# Patient Record
Sex: Female | Born: 1950 | Race: Black or African American | Hispanic: No | Smoking: Never smoker
Health system: Southern US, Community
[De-identification: ages and names within clinical notes are randomized; demographics above are authoritative.]

## PROBLEM LIST (undated history)

## (undated) DIAGNOSIS — R609 Edema, unspecified: Secondary | ICD-10-CM

## (undated) DIAGNOSIS — C50919 Malignant neoplasm of unspecified site of unspecified female breast: Secondary | ICD-10-CM

## (undated) DIAGNOSIS — G5600 Carpal tunnel syndrome, unspecified upper limb: Secondary | ICD-10-CM

## (undated) DIAGNOSIS — R011 Cardiac murmur, unspecified: Secondary | ICD-10-CM

## (undated) DIAGNOSIS — IMO0001 Reserved for inherently not codable concepts without codable children: Secondary | ICD-10-CM

## (undated) DIAGNOSIS — Z87898 Personal history of other specified conditions: Secondary | ICD-10-CM

## (undated) DIAGNOSIS — D509 Iron deficiency anemia, unspecified: Secondary | ICD-10-CM

## (undated) DIAGNOSIS — E11610 Type 2 diabetes mellitus with diabetic neuropathic arthropathy: Secondary | ICD-10-CM

## (undated) DIAGNOSIS — E119 Type 2 diabetes mellitus without complications: Secondary | ICD-10-CM

## (undated) DIAGNOSIS — I38 Endocarditis, valve unspecified: Secondary | ICD-10-CM

## (undated) DIAGNOSIS — C189 Malignant neoplasm of colon, unspecified: Secondary | ICD-10-CM

## (undated) DIAGNOSIS — C55 Malignant neoplasm of uterus, part unspecified: Secondary | ICD-10-CM

## (undated) DIAGNOSIS — I10 Essential (primary) hypertension: Secondary | ICD-10-CM

## (undated) DIAGNOSIS — M869 Osteomyelitis, unspecified: Secondary | ICD-10-CM

## (undated) DIAGNOSIS — K219 Gastro-esophageal reflux disease without esophagitis: Secondary | ICD-10-CM

## (undated) DIAGNOSIS — M810 Age-related osteoporosis without current pathological fracture: Secondary | ICD-10-CM

## (undated) DIAGNOSIS — I499 Cardiac arrhythmia, unspecified: Secondary | ICD-10-CM

## (undated) DIAGNOSIS — Z8719 Personal history of other diseases of the digestive system: Secondary | ICD-10-CM

## (undated) HISTORY — PX: OTHER SURGICAL HISTORY: SHX169

## (undated) HISTORY — PX: DILATION AND CURETTAGE OF UTERUS: SHX78

## (undated) HISTORY — DX: Malignant neoplasm of uterus, part unspecified: C55

## (undated) HISTORY — PX: KNEE ARTHROSCOPY: SUR90

## (undated) HISTORY — PX: ESOPHAGOGASTRODUODENOSCOPY: SHX1529

## (undated) HISTORY — PX: FOOT AMPUTATION: SHX951

## (undated) HISTORY — PX: COLON SURGERY: SHX602

## (undated) HISTORY — PX: COLONOSCOPY: SHX174

## (undated) HISTORY — PX: EYE SURGERY: SHX253

## (undated) HISTORY — PX: INCISION AND DRAINAGE: SHX5863

## (undated) HISTORY — PX: SLEEVE GASTROPLASTY: SHX1101

## (undated) HISTORY — PX: REDUCTION MAMMAPLASTY: SUR839

## (undated) HISTORY — PX: TONSILLECTOMY: SUR1361

## (undated) HISTORY — PX: BREAST REDUCTION SURGERY: SHX8

## (undated) HISTORY — PX: BREAST SURGERY: SHX581

---

## 1990-05-23 HISTORY — PX: MASTECTOMY: SHX3

## 2000-05-23 HISTORY — PX: LAPAROSCOPIC RIGHT COLECTOMY: SHX5925

## 2004-06-30 ENCOUNTER — Ambulatory Visit: Payer: Self-pay | Admitting: Unknown Physician Specialty

## 2005-03-23 ENCOUNTER — Ambulatory Visit: Payer: Self-pay | Admitting: Unknown Physician Specialty

## 2005-04-07 ENCOUNTER — Ambulatory Visit: Payer: Self-pay | Admitting: Unknown Physician Specialty

## 2007-03-26 ENCOUNTER — Ambulatory Visit: Payer: Self-pay | Admitting: Family Medicine

## 2007-04-04 ENCOUNTER — Ambulatory Visit: Payer: Self-pay | Admitting: Family Medicine

## 2007-05-07 ENCOUNTER — Ambulatory Visit: Payer: Self-pay | Admitting: Unknown Physician Specialty

## 2007-10-22 ENCOUNTER — Ambulatory Visit: Payer: Self-pay | Admitting: Family Medicine

## 2008-06-09 ENCOUNTER — Ambulatory Visit: Payer: Self-pay | Admitting: Family Medicine

## 2009-07-15 ENCOUNTER — Ambulatory Visit: Payer: Self-pay | Admitting: Family Medicine

## 2009-12-29 ENCOUNTER — Ambulatory Visit: Payer: Self-pay | Admitting: Ophthalmology

## 2010-01-12 ENCOUNTER — Ambulatory Visit: Payer: Self-pay | Admitting: Ophthalmology

## 2011-02-15 ENCOUNTER — Ambulatory Visit: Payer: Self-pay | Admitting: Family Medicine

## 2011-03-10 ENCOUNTER — Ambulatory Visit: Payer: Self-pay | Admitting: Bariatrics

## 2011-04-11 ENCOUNTER — Ambulatory Visit: Payer: Self-pay | Admitting: Unknown Physician Specialty

## 2011-04-13 LAB — PATHOLOGY REPORT

## 2012-02-02 ENCOUNTER — Ambulatory Visit: Payer: Self-pay | Admitting: Unknown Physician Specialty

## 2012-02-03 LAB — PATHOLOGY REPORT

## 2012-05-17 ENCOUNTER — Ambulatory Visit: Payer: Self-pay | Admitting: Family Medicine

## 2013-04-30 ENCOUNTER — Ambulatory Visit: Payer: Self-pay | Admitting: Bariatrics

## 2013-04-30 LAB — CALCIUM: Calcium, Total: 9.6 mg/dL (ref 8.5–10.1)

## 2013-04-30 LAB — PHOSPHORUS: Phosphorus: 2.9 mg/dL (ref 2.5–4.9)

## 2013-04-30 LAB — LIPASE, BLOOD: Lipase: 75 U/L (ref 73–393)

## 2013-04-30 LAB — MAGNESIUM: Magnesium: 1.6 mg/dL — ABNORMAL LOW

## 2013-07-15 ENCOUNTER — Ambulatory Visit: Payer: Self-pay | Admitting: Bariatrics

## 2013-07-21 ENCOUNTER — Ambulatory Visit: Payer: Self-pay | Admitting: Bariatrics

## 2013-11-27 DIAGNOSIS — E119 Type 2 diabetes mellitus without complications: Secondary | ICD-10-CM | POA: Insufficient documentation

## 2013-11-27 DIAGNOSIS — E782 Mixed hyperlipidemia: Secondary | ICD-10-CM | POA: Insufficient documentation

## 2013-11-27 DIAGNOSIS — I10 Essential (primary) hypertension: Secondary | ICD-10-CM | POA: Insufficient documentation

## 2014-07-22 ENCOUNTER — Ambulatory Visit: Payer: Self-pay | Admitting: Vascular Surgery

## 2014-07-24 ENCOUNTER — Encounter
Admit: 2014-07-24 | Disposition: A | Discharge status: Home / Self Care | Payer: Self-pay | Attending: Surgery | Admitting: Surgery

## 2014-07-31 ENCOUNTER — Ambulatory Visit: Payer: Self-pay | Admitting: Surgery

## 2014-08-07 ENCOUNTER — Ambulatory Visit: Payer: Self-pay | Admitting: Surgery

## 2014-08-19 ENCOUNTER — Other Ambulatory Visit: Payer: Self-pay | Admitting: Bariatrics

## 2014-08-19 LAB — CBC WITH DIFFERENTIAL/PLATELET
BASOS PCT: 0.7 %
Basophil #: 0.1 10*3/uL (ref 0.0–0.1)
EOS ABS: 0.1 10*3/uL (ref 0.0–0.7)
EOS PCT: 1.7 %
HCT: 27.3 % — AB (ref 35.0–47.0)
HGB: 8.4 g/dL — AB (ref 12.0–16.0)
Lymphocyte #: 2 10*3/uL (ref 1.0–3.6)
Lymphocyte %: 24.9 %
MCH: 24.4 pg — ABNORMAL LOW (ref 26.0–34.0)
MCHC: 30.8 g/dL — AB (ref 32.0–36.0)
MCV: 79 fL — ABNORMAL LOW (ref 80–100)
Monocyte #: 1 x10 3/mm — ABNORMAL HIGH (ref 0.2–0.9)
Monocyte %: 12.2 %
NEUTROS ABS: 4.8 10*3/uL (ref 1.4–6.5)
NEUTROS PCT: 60.5 %
Platelet: 615 10*3/uL — ABNORMAL HIGH (ref 150–440)
RBC: 3.45 10*6/uL — AB (ref 3.80–5.20)
RDW: 17.4 % — ABNORMAL HIGH (ref 11.5–14.5)
WBC: 7.8 10*3/uL (ref 3.6–11.0)

## 2014-08-19 LAB — COMPREHENSIVE METABOLIC PANEL
ALT: 14 U/L
Albumin: 2.9 g/dL — ABNORMAL LOW
Alkaline Phosphatase: 80 U/L
Anion Gap: 8 (ref 7–16)
BUN: 21 mg/dL — ABNORMAL HIGH
Bilirubin,Total: 0.4 mg/dL
CO2: 27 mmol/L
Calcium, Total: 9.3 mg/dL
Chloride: 99 mmol/L — ABNORMAL LOW
Creatinine: 1.3 mg/dL — ABNORMAL HIGH
EGFR (African American): 50 — ABNORMAL LOW
EGFR (Non-African Amer.): 43 — ABNORMAL LOW
Glucose: 105 mg/dL — ABNORMAL HIGH
POTASSIUM: 4.5 mmol/L
SGOT(AST): 16 U/L
SODIUM: 134 mmol/L — AB
Total Protein: 8.2 g/dL — ABNORMAL HIGH

## 2014-08-19 LAB — TSH: THYROID STIMULATING HORM: 0.948 u[IU]/mL

## 2014-08-19 LAB — FOLATE: Folic Acid: 34 ng/mL

## 2014-08-19 LAB — IRON: IRON: 14 ug/dL — AB

## 2014-08-19 LAB — FERRITIN: Ferritin (ARMC): 58 ng/mL

## 2014-08-19 LAB — MAGNESIUM: Magnesium: 1.6 mg/dL — ABNORMAL LOW

## 2014-08-20 LAB — HEMOGLOBIN A1C: Hemoglobin A1C: 7.1 % — ABNORMAL HIGH

## 2014-08-22 ENCOUNTER — Encounter
Admit: 2014-08-22 | Disposition: A | Discharge status: Skilled Nursing Facility | Payer: Self-pay | Attending: Surgery | Admitting: Surgery

## 2014-08-25 ENCOUNTER — Emergency Department
Admit: 2014-08-25 | Disposition: A | Discharge status: Home / Self Care | Payer: Self-pay | Admitting: Emergency Medicine

## 2014-08-25 LAB — CBC WITH DIFFERENTIAL/PLATELET
BASOS ABS: 0 10*3/uL (ref 0.0–0.1)
Basophil %: 0.3 %
Eosinophil #: 0.2 10*3/uL (ref 0.0–0.7)
Eosinophil %: 1.3 %
HCT: 26.7 % — ABNORMAL LOW (ref 35.0–47.0)
HGB: 8.2 g/dL — ABNORMAL LOW (ref 12.0–16.0)
LYMPHS ABS: 1.7 10*3/uL (ref 1.0–3.6)
Lymphocyte %: 13.6 %
MCH: 24.1 pg — AB (ref 26.0–34.0)
MCHC: 30.7 g/dL — ABNORMAL LOW (ref 32.0–36.0)
MCV: 78 fL — AB (ref 80–100)
MONO ABS: 1.3 x10 3/mm — AB (ref 0.2–0.9)
Monocyte %: 10.5 %
Neutrophil #: 9.5 10*3/uL — ABNORMAL HIGH (ref 1.4–6.5)
Neutrophil %: 74.3 %
Platelet: 623 10*3/uL — ABNORMAL HIGH (ref 150–440)
RBC: 3.41 10*6/uL — ABNORMAL LOW (ref 3.80–5.20)
RDW: 17.6 % — AB (ref 11.5–14.5)
WBC: 12.8 10*3/uL — AB (ref 3.6–11.0)

## 2014-08-25 LAB — COMPREHENSIVE METABOLIC PANEL
ALBUMIN: 2.8 g/dL — AB
AST: 15 U/L
Alkaline Phosphatase: 70 U/L
Anion Gap: 8 (ref 7–16)
BUN: 23 mg/dL — ABNORMAL HIGH
CALCIUM: 9.1 mg/dL
CO2: 26 mmol/L
Chloride: 100 mmol/L — ABNORMAL LOW
Creatinine: 1.09 mg/dL — ABNORMAL HIGH
EGFR (African American): >60
GFR CALC NON AF AMER: 53 — AB
GLUCOSE: 144 mg/dL — AB
POTASSIUM: 4.5 mmol/L
SGPT (ALT): 12 U/L — ABNORMAL LOW
SODIUM: 134 mmol/L — AB
Total Protein: 8.5 g/dL — ABNORMAL HIGH

## 2014-08-30 ENCOUNTER — Inpatient Hospital Stay
Admit: 2014-08-30 | Disposition: A | Discharge status: Skilled Nursing Facility | Payer: Self-pay | Attending: Internal Medicine | Admitting: Internal Medicine

## 2014-08-30 LAB — COMPREHENSIVE METABOLIC PANEL
ALK PHOS: 104 U/L
ALT: 15 U/L
Albumin: 2.7 g/dL — ABNORMAL LOW
Anion Gap: 8 (ref 7–16)
BUN: 23 mg/dL — ABNORMAL HIGH
Bilirubin,Total: 0.3 mg/dL
CALCIUM: 8.9 mg/dL
CO2: 27 mmol/L
CREATININE: 1.03 mg/dL — AB
Chloride: 99 mmol/L — ABNORMAL LOW
GFR CALC NON AF AMER: 57 — AB
Glucose: 129 mg/dL — ABNORMAL HIGH
Potassium: 3.9 mmol/L
SGOT(AST): 18 U/L
Sodium: 134 mmol/L — ABNORMAL LOW
Total Protein: 8.6 g/dL — ABNORMAL HIGH

## 2014-08-30 LAB — CBC WITH DIFFERENTIAL/PLATELET
Basophil #: 0 x10 3/mm 3
Basophil %: 0.4 %
Eosinophil #: 0.2 x10 3/mm 3
Eosinophil %: 2.1 %
HCT: 26.9 % — ABNORMAL LOW
HGB: 8.2 g/dL — ABNORMAL LOW
Lymphocyte %: 23.7 %
Lymphs Abs: 2 x10 3/mm 3
MCH: 23.7 pg — ABNORMAL LOW
MCHC: 30.5 g/dL — ABNORMAL LOW
MCV: 78 fL — ABNORMAL LOW
Monocyte #: 0.9 "x10 3/mm "
Monocyte %: 9.9 %
Neutrophil #: 5.5 x10 3/mm 3
Neutrophil %: 63.9 %
Platelet: 660 x10 3/mm 3 — ABNORMAL HIGH
RBC: 3.45 X10 6/mm 3 — ABNORMAL LOW
RDW: 17.3 % — ABNORMAL HIGH
WBC: 8.7 x10 3/mm 3

## 2014-08-30 LAB — LIPID PANEL
CHOLESTEROL: 100 mg/dL
HDL: 30 mg/dL — AB
Ldl Cholesterol, Calc: 51 mg/dL
Triglycerides: 95 mg/dL
VLDL CHOLESTEROL, CALC: 19 mg/dL

## 2014-08-31 LAB — BASIC METABOLIC PANEL
Anion Gap: 6 — ABNORMAL LOW (ref 7–16)
BUN: 19 mg/dL
Calcium, Total: 8.3 mg/dL — ABNORMAL LOW
Chloride: 102 mmol/L
Co2: 26 mmol/L
Creatinine: 0.94 mg/dL
EGFR (African American): >60
EGFR (Non-African Amer.): >60
Glucose: 119 mg/dL — ABNORMAL HIGH
Potassium: 3.9 mmol/L
Sodium: 134 mmol/L — ABNORMAL LOW

## 2014-08-31 LAB — HEMOGLOBIN A1C: Hemoglobin A1C: 6.9 % — ABNORMAL HIGH

## 2014-08-31 LAB — CBC WITH DIFFERENTIAL/PLATELET
BASOS PCT: 0.4 %
Basophil #: 0 10*3/uL (ref 0.0–0.1)
EOS PCT: 1.9 %
Eosinophil #: 0.2 10*3/uL (ref 0.0–0.7)
HCT: 21.8 % — ABNORMAL LOW (ref 35.0–47.0)
HGB: 6.8 g/dL — AB (ref 12.0–16.0)
Lymphocyte #: 2.3 10*3/uL (ref 1.0–3.6)
Lymphocyte %: 26.2 %
MCH: 24 pg — AB (ref 26.0–34.0)
MCHC: 31.2 g/dL — ABNORMAL LOW (ref 32.0–36.0)
MCV: 77 fL — ABNORMAL LOW (ref 80–100)
MONOS PCT: 13.3 %
Monocyte #: 1.1 x10 3/mm — ABNORMAL HIGH (ref 0.2–0.9)
Neutrophil #: 5 10*3/uL (ref 1.4–6.5)
Neutrophil %: 58.2 %
PLATELETS: 570 10*3/uL — AB (ref 150–440)
RBC: 2.83 10*6/uL — AB (ref 3.80–5.20)
RDW: 16.9 % — ABNORMAL HIGH (ref 11.5–14.5)
WBC: 8.6 10*3/uL (ref 3.6–11.0)

## 2014-08-31 LAB — MAGNESIUM: Magnesium: 1.6 mg/dL — ABNORMAL LOW

## 2014-08-31 LAB — HEMOGLOBIN: HGB: 7.4 g/dL — AB (ref 12.0–16.0)

## 2014-08-31 LAB — HEMATOCRIT: HCT: 23.7 % — ABNORMAL LOW (ref 35.0–47.0)

## 2014-09-01 LAB — HEMOGLOBIN: HGB: 8.3 g/dL — AB (ref 12.0–16.0)

## 2014-09-01 LAB — VANCOMYCIN, TROUGH: VANCOMYCIN, TROUGH: 18 ug/mL

## 2014-09-02 LAB — SEDIMENTATION RATE: Erythrocyte Sed Rate: >140 mm/hr — ABNORMAL HIGH (ref 0–30)

## 2014-09-03 LAB — CBC WITH DIFFERENTIAL/PLATELET
BASOS PCT: 0.5 %
Basophil #: 0.1 10*3/uL (ref 0.0–0.1)
Eosinophil #: 0.2 10*3/uL (ref 0.0–0.7)
Eosinophil %: 1.5 %
HCT: 23.8 % — AB (ref 35.0–47.0)
HGB: 7.5 g/dL — AB (ref 12.0–16.0)
Lymphocyte #: 2 10*3/uL (ref 1.0–3.6)
Lymphocyte %: 16.5 %
MCH: 24.4 pg — AB (ref 26.0–34.0)
MCHC: 31.7 g/dL — ABNORMAL LOW (ref 32.0–36.0)
MCV: 77 fL — ABNORMAL LOW (ref 80–100)
Monocyte #: 1.4 x10 3/mm — ABNORMAL HIGH (ref 0.2–0.9)
Monocyte %: 11.9 %
NEUTROS ABS: 8.4 10*3/uL — AB (ref 1.4–6.5)
Neutrophil %: 69.6 %
Platelet: 561 10*3/uL — ABNORMAL HIGH (ref 150–440)
RBC: 3.09 10*6/uL — ABNORMAL LOW (ref 3.80–5.20)
RDW: 17.1 % — AB (ref 11.5–14.5)
WBC: 12 10*3/uL — ABNORMAL HIGH (ref 3.6–11.0)

## 2014-09-03 LAB — MAGNESIUM: Magnesium: 1.6 mg/dL — ABNORMAL LOW

## 2014-09-04 LAB — CULTURE, BLOOD (SINGLE)

## 2014-09-05 LAB — CULTURE, BLOOD (SINGLE)

## 2014-09-05 LAB — CREATININE, SERUM
Creatinine: 1.05 mg/dL — ABNORMAL HIGH
GFR CALC NON AF AMER: 56 — AB

## 2014-09-05 LAB — WOUND CULTURE

## 2014-09-06 LAB — WOUND CULTURE

## 2014-09-15 ENCOUNTER — Observation Stay
Admit: 2014-09-15 | Disposition: A | Discharge status: Skilled Nursing Facility | Payer: Self-pay | Attending: Internal Medicine | Admitting: Internal Medicine

## 2014-09-15 LAB — URINALYSIS, COMPLETE
BILIRUBIN, UR: NEGATIVE
Blood: NEGATIVE
Glucose,UR: NEGATIVE mg/dL (ref 0–75)
KETONE: NEGATIVE
Nitrite: NEGATIVE
PH: 6 (ref 4.5–8.0)
PROTEIN: NEGATIVE
SPECIFIC GRAVITY: 1.016 (ref 1.003–1.030)
Transitional Epi: <1

## 2014-09-15 LAB — COMPREHENSIVE METABOLIC PANEL
ALK PHOS: 108 U/L
ANION GAP: 11 (ref 7–16)
Albumin: 2.7 g/dL — ABNORMAL LOW
BILIRUBIN TOTAL: 0.4 mg/dL
BUN: 22 mg/dL — ABNORMAL HIGH
CHLORIDE: 99 mmol/L — AB
CO2: 24 mmol/L
Calcium, Total: 9.1 mg/dL
Creatinine: 1.04 mg/dL — ABNORMAL HIGH
EGFR (Non-African Amer.): 56 — ABNORMAL LOW
GLUCOSE: 175 mg/dL — AB
Potassium: 4.4 mmol/L
SGOT(AST): 30 U/L
SGPT (ALT): 12 U/L — ABNORMAL LOW
SODIUM: 134 mmol/L — AB
Total Protein: 7.7 g/dL

## 2014-09-15 LAB — CBC
HCT: 30.7 % — ABNORMAL LOW (ref 35.0–47.0)
HGB: 9.4 g/dL — ABNORMAL LOW (ref 12.0–16.0)
MCH: 24.5 pg — ABNORMAL LOW (ref 26.0–34.0)
MCHC: 30.7 g/dL — ABNORMAL LOW (ref 32.0–36.0)
MCV: 80 fL (ref 80–100)
Platelet: 566 10*3/uL — ABNORMAL HIGH (ref 150–440)
RBC: 3.84 10*6/uL (ref 3.80–5.20)
RDW: 19 % — AB (ref 11.5–14.5)
WBC: 11.1 10*3/uL — ABNORMAL HIGH (ref 3.6–11.0)

## 2014-09-15 LAB — TROPONIN I

## 2014-09-15 LAB — SURGICAL PATHOLOGY

## 2014-09-15 LAB — LACTIC ACID, PLASMA: LACTIC ACID, VENOUS: 1.3 mmol/L

## 2014-09-15 LAB — CK TOTAL AND CKMB (NOT AT ARMC)
CK, Total: 84 U/L
CK-MB: 1.6 ng/mL

## 2014-09-16 LAB — CBC WITH DIFFERENTIAL/PLATELET
BASOS ABS: 0 10*3/uL (ref 0.0–0.1)
Basophil %: 0.5 %
Eosinophil #: 0.1 10*3/uL (ref 0.0–0.7)
Eosinophil %: 1.7 %
HCT: 24.5 % — AB (ref 35.0–47.0)
HGB: 8 g/dL — ABNORMAL LOW (ref 12.0–16.0)
LYMPHS ABS: 2.5 10*3/uL (ref 1.0–3.6)
Lymphocyte %: 35.3 %
MCH: 25.2 pg — ABNORMAL LOW (ref 26.0–34.0)
MCHC: 32.5 g/dL (ref 32.0–36.0)
MCV: 78 fL — AB (ref 80–100)
Monocyte #: 0.8 x10 3/mm (ref 0.2–0.9)
Monocyte %: 10.4 %
NEUTROS ABS: 3.7 10*3/uL (ref 1.4–6.5)
Neutrophil %: 52.1 %
Platelet: 515 10*3/uL — ABNORMAL HIGH (ref 150–440)
RBC: 3.16 10*6/uL — ABNORMAL LOW (ref 3.80–5.20)
RDW: 18.9 % — ABNORMAL HIGH (ref 11.5–14.5)
WBC: 7.2 10*3/uL (ref 3.6–11.0)

## 2014-09-16 LAB — LIPID PANEL
Cholesterol: 125 mg/dL
HDL Cholesterol: 30 mg/dL — ABNORMAL LOW
Ldl Cholesterol, Calc: 74 mg/dL
Triglycerides: 103 mg/dL
VLDL Cholesterol, Calc: 21 mg/dL

## 2014-09-16 LAB — BASIC METABOLIC PANEL
Anion Gap: 5 — ABNORMAL LOW (ref 7–16)
BUN: 23 mg/dL — ABNORMAL HIGH
CHLORIDE: 103 mmol/L
CO2: 29 mmol/L
CREATININE: 0.93 mg/dL
Calcium, Total: 8.6 mg/dL — ABNORMAL LOW
EGFR (Non-African Amer.): >60
Glucose: 93 mg/dL
POTASSIUM: 3.8 mmol/L
Sodium: 137 mmol/L

## 2014-09-17 LAB — CULTURE, BLOOD (SINGLE)

## 2014-09-21 NOTE — Consult Note (Signed)
PATIENT NAME:  [name], [name] MR#:  [id number] DATE OF BIRTH:  [birth year]  DATE OF CONSULTATION:  08/30/2014  REFERRING PHYSICIAN:   CONSULTING PHYSICIAN:  Gerrit Heck. 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   HISTORY OF PRESENT ILLNESS: The patient is a 64 year old African American female who has had chronic plantar ulcers on both heels that have been nonprogressive. She has been on some Cipro, was seen recently in the ER and thought she might have a partial Achilles rupture, so they took her off of that and put her in a splint. That was this week. She presents to the ER today and was admitted for hospitalization. She has been seen by Dr. Cleda Mccreedy and by Dr. Delana Meyer  and has been followed by the [facility] for dressings. Currently, her wounds are pronounced and she has not progressed particularly well with these areas. There is somewhat of a greenish drainage from the right heel.   PAST MEDICAL HISTORY: Includes: 1. Morbid obesity.  2. Type 2 diabetes.  3. Breast cancer.  4. Colon cancer.  5. Hyperlipidemia. 6. Hypertension.  7. Osteoarthritis.  8. Anemia.  9. Bladder dysfunction.  10. Umbilical hernia.   PAST SURGICAL HISTORY: Includes a mastectomy, colon polyp removal, and bariatric surgery in 2015.   FAMILY HISTORY: Uncertain.   SOCIAL HISTORY: She lives with husband and son.  Son is able to drive her to her appointments.   ALLERGIES: INCLUDE CODEINE.   CURRENT MEDICATIONS: Include doxycycline, Bactrim, metformin, B complex vitamin, calcium citrate, aspirin, metoprolol, amitriptyline, omeprazole, lovastatin, oxybutynin.    VITAL SIGNS: Include temperature 98.2, pulse 92, respirations 18, blood pressure 114/58, pulse oximetry is 98%.   LABORATORY WORK:  Includes white blood cell count of 12.8, red blood cells of 3.41, hemoglobin of 8.2, with hematocrit of 26.7.   LOWER EXTREMITY EXAM: Vascular:  The patient  has palpable  pulses.  Both DP pulses are +2 over 4 bilaterally. Posterior tibial pulses are difficult to palpate due to edema around the ankles. Dermatologic: The patient has very large ulcerations to the plantar areas of both heels;  the right is approximately 8 cm x 6 cm and is into the fatty tissue and probably the bone. Left heel is approximately 5 x 5 cm with significant depth through the skin, fatty tissues, and likely down to bone also. X-rays show extensive deterioration of the soft tissue to the area. Overall, the patient does not appear to have significant cellulitis to the region of lower extremity and no progressing cellulitis of the leg at this timeframe.   CLINICAL IMPRESSION: Pronounced large diabetic ulcerations to both heels with likely osteomyelitis.   TREATMENT PLAN: Get MRIs on both of these today. We will take a look at these tomorrow. I spent some time talking to her and her son about treatments for this and the fact that she is at risk for bilateral BKAs. I will see what the MRIs look like tomorrow regarding infection of the bone, and I think in order to get these healed up through non-amputation type techniques, she is going to need some extensive plastic surgery if the infection is under control. I will speak to them more about that tomorrow.  Redressed everything today with calcium alginate dressings along with gauze dressings and padding, and I will check her again tomorrow. She is currently getting Zosyn intravenously for  infection control.     ____________________________ Gerrit Heck. Jonerik Sliker, DPM mgt:tr D: 08/30/2014 15:02:21  ET T: 08/30/2014 16:04:48 ET JOB#: [id number]  cc: Gerrit Heck Artasia Thang, DPM, <Dictator> Perry Mount MD ELECTRONICALLY SIGNED 09/08/2014 13:45

## 2014-09-21 NOTE — Op Note (Signed)
PATIENT NAME:  [name], [name] MR#:  [id number] DATE OF BIRTH:  05/05/1951  DATE OF SURGERY:  09/02/2014  PREOPERATIVE DIAGNOSIS:  Osteomyelitis, bilateral calcaneus.   POSTOPERATIVE DIAGNOSIS:  Osteomyelitis, bilateral calcaneus.   PROCEDURE PERFORMED:  1. Partial calcanectomy, debridement and wound vacuum-assisted closure placement to the right heel.  2. Debridement of skin, soft tissue and bone of left heel with wound vacuum-assisted closure  placement.   SURGEON:  Aliyanna Wassmer.    ASSISTANT:  (nonectation Anomaly) <<noneMISSING TEXT>>    HISTORY OF PRESENT ILLNESS:  The patient has had chronic bilateral plantar heel ulcers.  The right measures 8 x 9 cm; the left 4 x 6 cm with deep penetration down to the bone.  MRI findings were indicative of osteomyelitis in both heels with plantar cortex erosion on the right heel in particular.  Also, the patient had an Achilles tendon rupture off the posterior right heel.  The patient has been treated at the [facility] and has failed to improve and was hospitalized because of the severity of her ulcerations and the likelihood for osteomyelitis.  The patient has had multiple medical problems, including morbid obesity and diabetes with diabetic neuropathy.  During her hospital stay over the last couple of days, I offered to transfer her to [facility] for further care with the knowledge that there may be a need for below-knee amputation to the right leg at this time frame and debridement of the left foot wound.  She did not want to pursue that direction and asked me to try to resolve the foot issue if possible and wanted to try that without doing anything else.   ANESTHESIA:  General with local.   ANESTHESIOLOGIST:  (Dictation Anomaly) <<unknownMISSING TEXT>>    BLOOD LOSS:  50 mL.   HEMOSTASIS:  Ankle tourniquet 250 mmHg pressure on the right one for approximately 30 minutes.  No tourniquet was used on the left one.   OPERATIVE REPORT:  The patient was  brought to the OR and placed on the OR table in the supine position.  At this point after general anesthesia was achieved by the anesthesia team and local anesthesia was achieved by me, the patient was then prepped and draped in the usual sterile manner.    At this time, attention was directed to the left plantar heel.  A Versajet was used to debride and clean up the superficial aspects of the wound.  There was a deep penetrating ulcer going down to the plantar fascial ligament.  The ligament appeared to be somewhat infected and diseased.  I removed this portion of the ligament in toto.  Versajet was used to clean all the area to the deep soft tissue.  The plantar heel bone was exposed in this area and this was rongeured.  The bone felt to be fairly stable with no purulence coming from the area at this time frame.  The area was then copiously irrigated and further used the Versajet to remove superficial colonization of bacteria and a wound VAC was then placed across the area after gentamicin and vancomycin beads were placed in the region.  Three 2-0 nylon sutures were placed across the area to close up the area where the beads were put.  Once the wound VAC was in place, attention was directed to the right heel.  The tourniquet was inflated to 250 mmHg pressure following exsanguination. Attention was directed to the plantar heel.  An incision was made through the plantar ulcer down to bone  and extended up the posterior heel skin down to bone as well.  The posterior plantar aspect of the heel was seen to be degenerative and poor quality and was likely associated with osteomyelitis.  A culture of the bone was taken at this time frame and sent for evaluation.  At this time, the posterior third of the heel was resected utilizing power equipment.  This was to remove all infected bone. The area was cleaned up with a combination of a power rasp and sagittal saw. The bone deep to this appeared to be fairly clean with no  purulent drainage or cystic formation noted in the region.  After copious irrigation, the posterior incision was closed with 2-0 nylon simple interrupted sutures and this was pulled proximally to close some of the plantar wound.  At this time, the gentamicin and vancomycin beads were placed into the area as well and further closure was achieved over that region with 2-0 nylon simple interrupted sutures.  There is an area of non-closable wound at this point approximately 4 x 5 cm.  The area was then dressed with a wound VAC, and once it was appropriately sealed, the wound VAC was turned on and adequate removal of fluids in an enclosed device was noted.  At this point, the area was dressed with Kerlix, and the patient left the OR to the recovery room with vital signs stable and neurovascular status intact.  The patient appeared to tolerate the procedure and anesthesia well.     ____________________________ Gerrit Heck. Derrisha Foos, DPM mgt:kc D: 09/02/2014 19:27:20 ET T: 09/02/2014 21:11:57 ET JOB#: [id number]  cc: Gerrit Heck Oaklan Persons, DPM, <Dictator> Perry Mount MD ELECTRONICALLY SIGNED 09/08/2014 13:46

## 2014-09-21 NOTE — Op Note (Signed)
PATIENT NAME:  [name], [name] MR#:  [id number] DATE OF BIRTH:  Jul 03, 1950  DATE OF PROCEDURE:  07/22/2014  PREOPERATIVE DIAGNOSIS: Atherosclerotic occlusive disease, bilateral lower extremities, with ulceration bilateral heels.   POSTOPERATIVE DIAGNOSES:   1.  Atherosclerotic occlusive disease, bilateral lower extremities, with ulceration bilateral heels.  2.  Congenital anomalous arterial anatomy.   PROCEDURE PERFORMED: 1.  Abdominal aortogram.  2.  Right lower extremity distal runoff, third order catheter placement.  3.  Additional third order catheter placement.   SURGEON: Katha Cabal, MD  SEDATION: Versed plus fentanyl.   CONTRAST USED: Isovue 70 mL.   FLUOROSCOPY TIME: 5.3 minutes.   INDICATIONS: Ms. Raffety is a 64 year old woman who presented to the office with ulcerations and gangrenous changes of bilateral plantar surface of both heels. The noninvasive studies showed non-compressibility of her tibial vessels at the ankle consistent with medial calcinosis. The risks and benefits for angiography with the hope for intervention and treatment were reviewed and the patient has agreed to proceed.   DESCRIPTION OF PROCEDURE: The patient is taken to special procedures and placed in the supine position. After adequate sedation is achieved, both groins are prepped and draped in sterile fashion. Ultrasound is placed in a sterile sleeve. Femoral artery is identified. It is echolucent and pulsatile indicating patency. Image is recorded for the permanent record. Under real-time visualization, after 1% lidocaine has been infiltrated, a microneedle is inserted, microwire followed by micro sheath, J-wire followed by a 5 French sheath and 5 French pigtail catheter. The pigtail catheter is positioned at T12 and AP projection of the aorta is obtained. Pigtail catheter is repositioned and a second AP projection is obtained. Pigtail catheter is then repositioned a third time and an LAO projection of the  pelvis is obtained.   A stiff angled Glidewire and pigtail catheter are used to cross the aortic bifurcation. The catheter is advanced down into the right external iliac and an RAO projection of the groin is obtained. Wire is then reintroduced and negotiated into the SFA and imaging is obtained; however, distal images are washed out and therefore, the catheter is exchanged over a Glidewire for a straight 125 Slip-Cath, which is advanced down to the popliteal and subsequently into the anterior tibial artery where hand injection of contrast demonstrates the distal anterior tibial and flow to the foot. The catheter is then repositioned up into the popliteal just above the knee where the anomalous origin of the posterior tibial is. The wire is then used to negotiate the catheter into the posterior tibial and again distal runoff is obtained. After review of the images, the catheter is removed. The sheath is evaluated with an LAO projection of the left groin and a StarClose device  deployed. There are no immediate complications.   INTERPRETATION: Imaging of the abdominal aorta and the iliacs demonstrate the arteries are widely patent. There are diffuse calcifications noted, but there are no hemodynamically significant lesions identified.   The right common femoral, profunda femoris, and superficial femoral are widely patent as is the popliteal. There is an anomalous origin at knee popliteal of the posterior tibial which is somewhat small in caliber, tapering, but widely patent down to an aberrant collateral at the ankle. There is no true lateral plantar or medial plantar arteries, and there is very poor filling of the pedal arch. There is, on a magnified injection from this artery, fairly good flow into the heel area with a hyperemic blush consistent with the ulceration that she  has.   On evaluation of the peroneal/anterior tibial, they appear to have a common trunk, but then divides and an aberrant course to the  dorsalis pedis, I believe I am in the anterior tibial because it has a direct line into the dorsalis pedis, but based on the configuration, it almost looks like the peroneal is the feeding vessel into the dorsalis pedis, again; however, there is very poor filling of the pedal arch and there does not from this selective injection appear to be a contribution to the heel blood flow.   SUMMARY: There is brisk flow to the foot. There is abnormal congenital anatomy present that  does appear to have some impact on the flow pattern to the foot; however, there does appear to be more than adequate perfusion of the ankle and foot area that should allow for healing of ulcerations on the plantar surface of the foot.    ____________________________ Katha Cabal, MD ggs:LT D: 07/22/2014 17:14:09 ET T: 07/22/2014 21:13:13 ET JOB#: [id number]  cc: Katha Cabal, MD, <Dictator> Sarah "Sallie" Posey Pronto, MD Sharlotte Alamo, [place] MD ELECTRONICALLY SIGNED 07/29/2014 14:28

## 2014-09-21 NOTE — Consult Note (Signed)
Brief Consult Note: Diagnosis: severe wounds to both heels swith likely osteo.   Patient was seen by consultant.   Consult note dictated.   Recommend further assessment or treatment.   Discussed with Attending MD.   Comments: Get MRI to both heels and evaluate tomorrow.  Electronic Signatures: Perry Mount (MD)  (Signed 09-Apr-16 15:07)  Authored: Brief Consult Note   Last Updated: 09-Apr-16 15:07 by Perry Mount (MD)

## 2014-09-21 NOTE — Discharge Summary (Signed)
Dates of Admission and Diagnosis:  Date of Admission 30-Aug-2014   Date of Discharge 05-Sep-2014   Admitting Diagnosis Bilateral heel ulcers   Final Diagnosis 1. Alcaligenes fecalis bacteremia 2. Polymicrobial(pseudomonas, alcaligenes, enterococcus) osteomyelitis of bilateral calcaneals(Heels) 3. Anemia of Chronic Disease s/p 1 Units PRBC 4. hypertension  5. Obesity 6. Dm 2    Chief Complaint/History of Present Illness PRIMARY CARE PHYSICIAN: Sarah "Sallie" Posey Pronto, MD at [facility].   CHIEF COMPLAINT: Nonhealing ulcers of both lower extremities with pain and inability to walk.   HISTORY OF PRESENT ILLNESS: Mr. Champine is a 64 year old African American female with past medical history significant for history of peripheral vascular disease, non-insulin-dependent diabetes mellitus, peripheral neuropathy, bladder dysfunction, chronic anemia, hypertension, hyperlipidemia who presents from [facility] today secondary to above-mentioned complaint. It seems according to the son the patient's heel ulcers have been started about 2 months ago. She has seen Dr. Cleda Mccreedy from podiatry who referred her to see Dr. Delana Meyer for an angiogram to rule out peripheral vascular occlusion. The patient had an angiogram done on 07/22/2014, which shows arteriosclerotic occlusive disease, but no major occlusions, but needed revascularization. She was referred to go to [facility], and they have been debriding her wound. She has been on Bactrim and ciprofloxacin on and off for her ulcers. Her ulcers have not been improving. She has been put on boots and she had a recent Emergency Room visit 3 days ago for the same worsening pain and nonhealing ulcers and x-ray showed at the time possible osteomyelitis. She was discharged home on doxycycline and Bactrim and advised to follow up with podiatry and also infectious disease physician. She could not get an appointment sooner for the ID Clinic and went to see her PCP  today who saw all the notes and thought that she needs to be admitted for IV antibiotics and further debridement. The patient has not had any MRI of her feet for these ulcers.  Prior to starting the ulcer, she was ambulatory at home using a cane. Now with boots, the pain and the ulcers, she has been needing the walker.   Allergies:  Tylenol w/Codeine: Unknown  Penicillin: N/V/Diarrhea  Pertinent Past History:  Pertinent Past History PAST MEDICAL HISTORY:  1.  Morbid obesity.  2.  Peripheral vascular disease.  3.  Non-insulin-dependent diabetes mellitus.  4.  Peripheral neuropathy.  5.  Diabetic foot ulcers.  6.  Overactive bladder.  7.  Chronic anemia.  8.  Hyperlipidemia.  9.  Hypertension.  10.  Osteoarthritis.  11.  History of adenocarcinoma of the left breast.   Hospital Course:  Hospital Course 64y/oF with morbid obesity status post gastric bypass in sept 2015, NIDDM, arthritis, chronic anemia, hyperlipidemia sent in [facility] for worsening bilaetral heel ulcers and recent ER x ray showing a possible osetomyelitis.  * Alcaligenis bacteremia On IV abx. Appreciate ID help  * Bilateral calcaneal osteomyelitis with chronic ulcers On Zosyn Would cx has Pseudomonas, Alicalegen, Enterococcus - Polymicrobial Wound debridement with wound vac in place.  * Anemia of Chronic Disease - Improved after 1 unit PRBC  * PAD  recent evaluation by vasc and angiogram done- no acute occlusions  * NIDDM- metformin, Sliding Scale Insulin  * HTN- metoprolol  * Hyperlipidemia- statin  Continue wound vac. Zosyn per Dr. Blane Ohara recommendation.  Time spent on discharge 40 minutes   Condition on Discharge Fair   PHYSICAL EXAM ON DISCHARGE:  Physical Exam:  GEN obese  HEENT pale conjunctivae   NECK supple   RESP normal resp effort   CARD regular rate   Additional Comments Bilateral heel wound vac with dressing   VITAL SIGNS:  Vital Signs: **Vital  Signs.:   15-Apr-16 09:11  Vital Signs Type Routine  Temperature Temperature (F) 97.8  Celsius 36.5  Pulse Pulse 110  Respirations Respirations 20  Systolic BP Systolic BP 235  Diastolic BP (mmHg) Diastolic BP (mmHg) 71  Mean BP 89  Pulse Ox % Pulse Ox % 99  Pulse Ox Activity Level  At rest  Oxygen Delivery Room Air/ 21 %   DISCHARGE INSTRUCTIONS HOME MEDS:  Medication Reconciliation: Patient's Home Medications at Discharge:     Medication Instructions  amitriptyline 50 mg oral tablet  1 tab(s) orally once a day (at bedtime)   omeprazole 20 mg oral delayed release capsule  1 cap(s) orally once a day   lovastatin 40 mg oral tablet  1 tab(s) orally once a day   oxybutynin 15 mg/24 hr oral tablet, extended release  1 tab(s) orally once a day   aspirin 81 mg oral tablet  1 tab(s) orally once a day   zinc sulfate 220 mg oral capsule  1 cap(s) orally once a day   metformin 500 mg oral tablet  1 tab(s) orally 2 times a day   vitamin c 500 mg oral tablet  1 tab(s) orally 2 times a day   metoprolol tartrate 50 mg oral tablet  1 tab(s) orally every 12 hours   magnesium oxide 400 mg (241.3 mg elemental magnesium) oral tablet  1 tab(s) orally 2 times a day   multivitamin with minerals  1 tab(s) orally once a day   calcium-vitamin d 500 mg-200 intl units oral tablet  1 tab(s) orally    senna  1 tab(s) orally 2 times a day, As needed, constipation   piperacillin-tazobactam  3.375 gram(s)  every 8 hours   norco 10 mg-325 mg oral tablet  1 tab(s) orally 4 times a day, As Needed - for Pain    STOP TAKING THE FOLLOWING MEDICATION(S):    multivitamin b complex 50:   once a day  Physician's Instructions:  Dressing Care Continue wound vac per Dr. Elvina Mattes   Diet Low Sodium  Carbohydrate Controlled (ADA) Diet   Diet Consistency Regular Consistency   Activity Limitations As tolerated  non weight bearing on both feet   Return to Work Not Applicable   Time frame for Follow Up Appointment  1-2 weeks  Dr. Elvina Mattes   Time frame for Follow Up Appointment 1-2 weeks  Dr. Ola Spurr   Electronic Signatures: Darvin Neighbours, Lottie Dawson (MD)  (Signed 15-Apr-16 12:50)  Authored: ADMISSION DATE AND DIAGNOSIS, CHIEF COMPLAINT/HPI, Allergies, PERTINENT PAST HISTORY, HOSPITAL COURSE, PHYSICAL EXAM ON DISCHARGE, VITAL SIGNS, [place] MEDS, PATIENT INSTRUCTIONS   Last Updated: 15-Apr-16 12:50 by Alba Destine (MD)

## 2014-09-21 NOTE — H&P (Signed)
PATIENT NAME:  [name], [name] MR#:  [id number] DATE OF BIRTH:  [birth year]  DATE OF ADMISSION:  08/30/2014  ADMITTING PHYSICIAN: Gladstone Lighter, MD.  PRIMARY CARE PHYSICIAN: Sarah "Baltazar Apo, MD at [facility].   CHIEF COMPLAINT: Nonhealing ulcers of both lower extremities with pain and inability to walk.   HISTORY OF PRESENT ILLNESS: Mr. Hinkle is a 64 year old African American female with past medical history significant for history of peripheral vascular disease, non-insulin-dependent diabetes mellitus, peripheral neuropathy, bladder dysfunction, chronic anemia, hypertension, hyperlipidemia who presents from [facility] today secondary to above-mentioned complaint. It seems according to the son the patient's heel ulcers have been started about 2 months ago. She has seen Dr. Cleda Mccreedy from podiatry who referred her to see Dr. Delana Meyer for an angiogram to rule out peripheral vascular occlusion. The patient had an angiogram done on 07/22/2014, which shows arteriosclerotic occlusive disease, but no major occlusions, but needed revascularization. She was referred to go to the [facility], and they have been debriding her wound. She has been on Bactrim and ciprofloxacin on and off for her ulcers. Her ulcers have not been improving. She has been put on boots and she had a recent Emergency Room visit 3 days ago for the same worsening pain and nonhealing ulcers and x-ray showed at the time possible osteomyelitis. She was discharged home on doxycycline and Bactrim and advised to follow up with podiatry and also infectious disease physician. She could not get an appointment sooner for the ID Clinic and went to see her PCP today who saw all the notes and thought that she needs to be admitted for IV antibiotics and further debridement. The patient has not had any MRI of her feet for these ulcers.  Prior to starting the ulcer, she was ambulatory at home using a cane. Now with boots, the pain and the  ulcers, she has been needing the walker.  PAST MEDICAL HISTORY:  1.  Morbid obesity.  2.  Peripheral vascular disease.  3.  Non-insulin-dependent diabetes mellitus.  4.  Peripheral neuropathy.  5.  Diabetic foot ulcers.  6.  Overactive bladder.  7.  Chronic anemia.  8.  Hyperlipidemia.  9.  Hypertension.  10.  Osteoarthritis.  11.  History of adenocarcinoma of the left breast.   PAST SURGICAL HISTORY:  1.  Right breast reduction surgery.  2.  Bariatric surgery done in 01/2014 with significant weight loss.  3.  Left breast mastectomy along with axillary lymph node resection done in the past.  4.  Left breast augmentation surgery.  5.  Arthroscopic surgery of left knee.  6.  Tonsillectomy.   ALLERGIES: TO PENICILLIN. IT CAUSES GI DISTRESS MOSTLY. TYLENOL WITH CODEINE, WHICH CAUSES GI SYMPTOMS.   CURRENT HOME MEDICATIONS:  1.  She takes metformin 500 mg p.o. b.i.d.  2.  Calcium citrate with vitamin D 1 tablet p.o. daily.  3.  Multivitamin 1 tablet p.o. daily.  4.  B complex 1 tablet p.o. daily.  5.  Aspirin 81 mg p.o. daily.  6.  Metoprolol 200 mg p.o. b.i.d.  7.  Amitriptyline 50 mg p.o. at bedtime.  8.  Omeprazole 20 mg p.o. for GERD.  9.  Lovastatin 80 mg p.o. daily.  10.  Oxybutynin 15 mg at bedtime.   RECENT MEDICATIONS FROM THE EMERGENCY ROOM VISIT ON 08/25/2014: She was started on doxycycline 100 mg p.o. b.i.d. and Bactrim double strength 1 tablet p.o. b.i.d.   SOCIAL HISTORY: Lives at home with her husband  and son, ambulatory using a cane prior to the ulcers; but over the last month, she has been using a walker. No history of any smoking or alcohol use.   FAMILY HISTORY: Not known as both parents passed away when she was younger.   REVIEW OF SYSTEMS: CONSTITUTIONAL: No fever, fatigue, or weakness.  EYES: No blurred vision, double vision, inflammation, or glaucoma. No cataracts. Uses reading glasses.  ENT: No tinnitus, ear pain, hearing loss, epistaxis, or discharge.  No dysphagia.  RESPIRATORY: No cough, wheeze, hemoptysis, or COPD.  CARDIOVASCULAR: No chest pain, orthopnea, edema, arrhythmia, palpitations, or syncope.  GASTROINTESTINAL: No nausea, vomiting, diarrhea, abdominal pain, hematemesis, or melena.  GENITOURINARY: No dysuria, hematuria, renal calculus, frequency, or incontinence.  ENDOCRINE: No polyuria, nocturia, thyroid problems, heat or cold intolerance.  HEMATOLOGY: No anemia, easy bruising, or bleeding.  SKIN: No acne, rash, or lesions. Positive for ulcers.  MUSCULOSKELETAL: Positive for arthritis. No gout.  NEUROLOGIC: No numbness, weakness, CVA, TIA, or seizures.  PSYCHOLOGICAL: No anxiety, insomnia, or depression.   PHYSICAL EXAMINATION:  VITAL SIGNS: Temperature 98.2 degrees Fahrenheit, pulse 92, respirations 18, blood pressure 114/58, pulse oximetry 98% on room air.  GENERAL: Heavily built, well-nourished female lying in bed, not in any acute distress.  HEENT: Normocephalic, atraumatic. Pupils equal, round, reacting to light. Anicteric sclerae. Extraocular movements intact. Oropharynx clear without erythema, mass, or exudates.  NECK: Supple. No thyromegaly, JVD, or carotid bruits. No lymphadenopathy.  LUNGS: Moving air bilaterally. No wheeze or crackles. No use of accessory muscles for breathing.  CARDIOVASCULAR: S1, S2. Regular rate and rhythm. 3/6 systolic murmur present. No rubs or gallops.  ABDOMEN: Obese, soft, nontender, nondistended. No hepatosplenomegaly. Normal bowel sounds. The patient does have a huge ventral abdominal hernia and infraumbilical lesion for several years. No evidence of any incarceration.  EXTREMITIES: She has large edematous extremities, which seems like chronic edema. Both heels have ulcers, which have clean margins. The left one is about 4 cm diameter, very deep with almost bone exposing, granulation tissue is present. The right one has like green purulent discharge coming and it is very extensive, almost  involving the whole hindfoot on the plantar surface. She does have good dorsalis pedis pulses palpable bilaterally. Skin other than the lesions described above, no other lesions, acne, or ulcers.  NEUROLOGIC: Cranial nerves intact. No focal motor or sensory deficits.  PSYCHOLOGICAL: The patient is awake, alert, oriented x3.   LABORATORY DATA: WBC 8.7, hemoglobin 8.3, hematocrit 26.9, platelet count 660,000, MCV 78. Sodium 134, potassium 3.9, chloride 99, bicarbonate 27, BUN 23, creatinine 1.03, glucose 129, calcium of 8.9. ALT 15, AST 18, alkaline phosphatase 104, total bilirubin 0.3, albumin of 3.7.   Recent right ankle x-ray showing calcaneus osteomyelitis and a large plantar ulceration.   ASSESSMENT AND PLAN: A 64 year old female status post gastric bypass surgery in 01/2014, diabetes, arthritis, chronic anemia, hyperlipidemia, sent in from [facility] for worsening bilateral heel ulcers and recent x-ray showing osteomyelitis.  1.  Bilateral heel ulcers going on for 2 months: Had vascular consult and angiogram done, no acute occlusion, seen by podiatry as an outpatient, following with wound care center as well. ER visit 4 days ago and x-ray on the right leg showing possible osteomyelitis, bedside debridement done, cultures have been sent. Blood cultures have been sent. The patient put on vancomycin and meropenem. Podiatry has been consulted and plan for further procedure after the MRI.  2.  Non-insulin-dependent diabetes mellitus: Check HbA1c. Started on metformin and sliding  scale insulin.  3.  Hypertension: Continue metoprolol.  4.  Chronic anemia: Hemoglobin is stable if she needs surgery. Monitor for any transfusion.  5.  Hyperlipidemia: On statin.  6.  Deep vein thrombosis prophylaxis with subcutaneous heparin.  7.  CODE STATUS of the patient is full code.   Time spent on admission is 50 minutes.    ____________________________ Gladstone Lighter, MD [id number] D: 08/30/2014  14:55:20 ET T: 08/30/2014 15:36:59 ET JOB#: [id number]  cc: Gladstone Lighter, MD, <Dictator> Sarah "Sallie" Posey Pronto, MD Gladstone Lighter MD ELECTRONICALLY SIGNED 09/12/2014 15:10

## 2014-09-21 NOTE — H&P (Signed)
PATIENT NAME:  [name], HOSIE MR#:  [id number] DATE OF BIRTH:  06/12/1950  DATE OF ADMISSION:  09/15/2014  REFERRING PHYSICIAN: Ahmed Prima, MD  PRIMARY CARE PHYSICIAN: Denton Lank, MD  CHIEF COMPLAINT: Weakness.  HISTORY OF PRESENT ILLNESS: A 64 year old African American female with a past medical history of obesity; type 2 diabetes, non-insulin-requiring, complicated by neuropathy; peripheral vascular disease; as well as chronic osteomyelitis of bilateral lower extremities; coming in with acute-onset weakness. She was actually at her podiatrist's office reassessing her osteomyelitis, noted to have right-sided weakness, most prominent of the face, and thus was sent to the hospital for further workup and evaluation. States that her weakness is somewhat improving and denies any further symptomatology.  REVIEW OF SYSTEMS:  CONSTITUTIONAL: Denies fevers, chills. Positive for fatigue, weakness.  EYES: Denies blurred vision, eye pain. EARS, NOSE, AND THROAT: Denies tinnitus, ear pain, hearing loss. RESPIRATORY: Denies cough, wheeze, shortness of breath.  CARDIOVASCULAR: Denies chest pain, palpitations.  GASTROINTESTINAL: Denies nausea, vomiting, diarrhea, abdominal pain.  GENITOURINARY: Denies dysuria, hematuria.  ENDOCRINE: Denies nocturia or thyroid problems. HEMATOLOGY AND LYMPHATIC: Denies easy bruising and bleeding.  SKIN: Denies rashes or lesions.  MUSCULOSKELETAL: Denies pain in neck, back, shoulder, knees, hips, or arthritic symptoms.  NEUROLOGIC: Denies paralysis, paresthesias.  PSYCHIATRIC: Denies anxiety or depressive symptoms.  Otherwise, full review of systems performed by me is negative.  PAST MEDICAL HISTORY: Includes type 2 diabetes, non-insulin-requiring, complicated by peripheral neuropathy; peripheral vascular disease; hypertension, essential; hyperlipidemia, unspecified.   SOCIAL HISTORY: No alcohol, tobacco, or drug usage.  FAMILY HISTORY: No known cardiovascular  or pulmonary disorders.  ALLERGIES: PENICILLIN AS WELL AS TYLENOL WITH CODEINE.   HOME MEDICATIONS: Include Aleve 220 mg p.o. q.8 hours as needed for pain; aspirin 81 mg  p.o. at bedtime; metformin 500 mg p.o. b.i.d.; lovastatin 40 mg p.o. daily; metoprolol 50 mg p.o. b.i.d.; senna 8.6 mg p.o. b.i.d. for constipation; zinc 220, 1 capsule daily; oxybutynin 15 mg daily; calcium 500 plus vitamin D 1 tablet daily; multivitamins 1 tablet daily; vitamin C 500 mg p.o. daily.   PHYSICAL EXAMINATION:  VITAL SIGNS: Temperature 98.4, heart rate 85, respirations 18, blood pressure 112/57, saturating 97% on room air. Weight 120.2 kg, BMI 41.5.  GENERAL: Well-nourished, obese, African American female currently in no acute distress.  HEAD: Normocephalic, atraumatic.  EYES: Pupils equal, round, reactive to light. Extraocular muscles intact. No scleral icterus. MOUTH: Dry mucous membrane. Dentition intact. No abscess noted. EARS, NOSE, THROAT: Clear to auscultation. No external lesions. NECK: Supple. No thyromegaly. No nodules. No JVD.  PULMONARY: Clear to auscultation bilaterally without wheezes, rales, or rhonchi. No use of accessory muscles. Good respiratory effort.  CHEST: Nontender to palpation.  CARDIOVASCULAR: S1, S2, regular rate and rhythm. No murmurs, rubs, or gallops. No edema. Pedal pulses 2+ bilaterally. GASTROINTESTINAL: Soft, nontender, nondistended. No masses. Positive bowel sounds. No hepatosplenomegaly.  MUSCULOSKELETAL: No swelling, clubbing, or edema. Range of motion full in all extremities.  NEUROLOGIC: Cranial nerves II through XII intact. No gross focal neurological deficits. Sensation intact. Reflexes intact. Strength 4/5 in all extremities, proximal and distal flexion and extension. Pronator drift within normal limits.  SKIN: Bilateral wound VACs on heels.  PSYCHIATRIC: Mood and affect within normal limits. Patient awake, alert, oriented x 3. Insight and judgment intact.  LABORATORY  DATA: Sodium 134, potassium 4.4, chloride 99, bicarbonate 24, BUN 22, creatinine 1.04, glucose 175. LFTs: Albumin 2.7, rest within normal limits. WBC of 11.1, hemoglobin 9.4, platelets of 566,000. Urinalysis within  normal limits. CT, head, performed: No acute intracranial process. Chest x-ray performed: No acute cardiopulmonary process.   ASSESSMENT AND PLAN: A 64 year old African-American female with a history of obesity, type 2 diabetes with neuropathy, presenting with right-sided weakness.  1.  Transient ischemic attack, unspecified. Place on telemetry observational status. Initiate aspirin and statin therapy. Neurological checks every 4 hours. MRI of the brain. Check transthoracic echocardiogram as well as carotid Dopplers.  2.  Type 2 diabetes complicated by neuropathy. Hold p.o. agents. Add insulin sliding scale, q.6 hour Accu-Cheks. 3.  Hyperlipidemia, unspecified. Continue with statin therapy. 4.  Bilateral osteomyelitis. Continue with Zosyn via peripherally inserted central catheter line.   CODE STATUS: The patient is full code.   TIME SPENT: 45 minutes.    ____________________________ Aaron Mose. Lareta Bruneau, MD dkh:ST D: 09/15/2014 20:10:07 ET T: 09/16/2014 00:34:32 ET JOB#: [id number]  cc: Aaron Mose. Aubryanna Nesheim, MD, <Dictator> Preslie Depasquale Woodfin Ganja MD ELECTRONICALLY SIGNED 09/17/2014 1:42

## 2014-09-21 NOTE — Consult Note (Addendum)
PATIENT NAME:  [name], [name] MR#:  [id number] DATE OF BIRTH:  10-13-1950  DATE OF CONSULTATION:  09/01/2014  REFERRING PHYSICIAN:   CONSULTING PHYSICIAN:  Cheral Marker. Ola Spurr, MD  REQUESTING PHYSICIAN:  Dr. Darvin Neighbours.    REASON FOR CONSULTATION: Bilateral heel ulcers and osteomyelitis.   CONSULTING PODIATRY: Dr. Elvina Mattes.   HISTORY OF PRESENT ILLNESS: This is a pleasant 64 year old female with history of obesity and diabetes, who is status post bariatric surgery in September of 2015. She has been dealing with severe ulceration of her bilateral upper extremities on her heels. She reports that the wounds started approximately 2 months ago. She denied any trauma, but thinks she just was rubbing them when she was walking as she shuffles her feet. These have progressed and have become quite extensive with underlying osteomyelitis. Of note she has seen Dr. Delana Meyer and had a negative angiogram without any evidence of significant peripheral vascular disease. She has been following with the wound care center. She has been on Bactrim and ciprofloxacin off and on for these ulcers. She however developed some pain in her ankle and was found to have an Achilles tendon rupture last week. She was discharged from the ED on doxycycline and Bactrim. She was admitted this time April 9 with worsening wounds. MRI was done and does seem to show calcaneal osteomyelitis. We are consulted for further antibiotic management.   PAST MEDICAL HISTORY:  1. Morbid obesity status post bariatric surgery September 2015 with 100 pound weight loss.  2. Diabetes non-insulin-dependent.  3. Peripheral neuropathy related to diabetes.  4. Diabetic foot ulcers as above.  5. Overactive bladder.  6. Chronic anemia.  7. Hyperlipidemia.  8. Hypertension.  9. Osteoarthritis.  10. History of carcinoma of the breast.   PAST SURGICAL HISTORY:  1. Breast reduction surgery.  2. Bariatric surgery September 2015.  3. Left mastectomy.  4. Left  breast augmentation surgery.  5. Arthroscopic surgery of the left knee.  6. Tonsillectomy.   SOCIAL HISTORY: She was living at home with her husband and son. She used to ambulate with a cane prior to this development of these ulcers and is now using a walker at home. She does not smoke or drink. She is potentially being discharged to a skilled nursing facility.   FAMILY HISTORY: Noncontributory.   REVIEW OF SYSTEMS: 11 systems reviewed and negative except as per HPI.    ALLERGIES: THE PATIENT IS ALLERGIC TO PENICILLIN AS IT CAUSES GI DISTRESS AND CODEINE WHICH ALSO CAUSES SIMILAR SYMPTOMS. SHE HAS NEVER HAD A SEVERE ALLERGIC REACTION TO PENICILLIN.   ANTIBIOTICS SINCE ADMISSION: Include vancomycin begun April 8 and meropenem begun April 8-10.   PHYSICAL EXAMINATION:  VITAL SIGNS: Temperature 99.2, pulse 89, blood pressure 126/71, respirations 17, saturation 97% on room air.  GENERAL: She is morbidly obese, lying in bed.  HEENT: Pupils reactive. Sclerae anicteric. Oropharynx clear.  NECK: Supple.  HEART: Regular, but distant.  LUNGS: Clear.  ABDOMEN: Soft, obese, nontender.  EXTREMITIES: She has 1 + edema bilateral lower extremities.  SKIN: On her bilateral heels she has extensive ulceration. The right has some bone that I can palpate exposed. There is some chronic drainage from it, however there does seem to be some decent granulation tissue on both sides.  NEUROLOGIC: She is alert and oriented x 3. Grossly nonfocal neurologic exam. 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   DATA: Pathology report March 17, a biopsy from outpatient wound clinic of the sore with bone  showed soft tissue ulceration and bone with osteomyelitis. White blood count April 10 is 8.6, hemoglobin 6.8, platelets 570,000. Renal function is normal with a creatinine of 0.94. Albumin is low at 2.7, otherwise LFTs are normal. Wound cultures are reviewed. Culture from the heel April 9 is growing heavy growth of  pseudomonas and heavy growth of gram-positive cocci and holding for possible anaerobes. The pseudomonas was sensitive on the left foot to ciprofloxacin, gentamicin, imipenem, but intermediate to ceftazidime and levofloxacin. Sensitivities on the right foot are pending. Blood cultures April 9 x 2 are negative. Prior culture of the foot March 17 grew pseudomonas pansensitive, Proteus pansensitive, enterococcus sensitive to ampicillin and linezolid, Staphylococcus epidermidis sensitive to tetracycline, ciprofloxacin, gentamicin, vancomycin, levofloxacin, and linezolid as well as a gram-positive rod not further identified. Culture from March 3 grew heavy growth of Enterococcus faecalis sensitive to ampicillin and linezolid, Staphylococcus auricularis sensitive to tetracycline, clindamycin, Cipro, erythromycin, vancomycin, and levofloxacin. It also grew Staphylococcus lugdunensis which was sensitive to tetracycline, clindamycin, oxacillin, ciprofloxacin, gentamicin, erythromycin, and levofloxacin.    IMAGING:  MRI of her left foot shows large plantar calcaneal ulceration extending to bone with calcaneal osteomyelitis and neuropathic midfoot. MRI of the right foot showed ruptured distal Achilles tendon with retraction of 3. cm. There is a large calcaneal ulceration with calcaneal osteomyelitis.   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 She has underlying osteomyelitis of the calcaneal bone. She has grown pseudomonas and in the past has grown enterococcus, Staphylococcus lugdunensis, Staphylococcus auricularis, and probable anaerobes from this. She has a PICC line in place.   RECOMMENDATIONS:  1.  I spoke with Dr. Elvina Mattes. I think she certainly will need a several week course of IV antibiotics to allow this to heal. She has an Achilles tendon rupture which  may or may not be related to the quinolone use, but I would suggest avoiding that in the future unless necessary. 2.  I have checked an ESR and CRP.  3.  Start Zosyn 3.375 q. 8 hours.  4.  I will check with the micro laboratory to insure the Zosyn was sensitive. The pseudomonas is somewhat resistant compared to the initial pseudomonas cultured in March. This is likely due to the intermittent treatment. If the Zosyn is resistant I would suggest she will need meropenem. 5.  She will need close followup with podiatry and possibly plastic surgery to heal this wound. 6.  Thanks for the consult. I will be glad to follow with you.      ____________________________ Cheral Marker. Ola Spurr, MD dpf:bu D: 09/01/2014 15:39:37 ET T: 09/01/2014 16:28:23 ET JOB#: [id number]  cc: Cheral Marker. Ola Spurr, MD, <Dictator> Jerlisa Diliberto Ola Spurr MD ELECTRONICALLY SIGNED 09/23/2014 10:17

## 2014-09-21 NOTE — Discharge Summary (Signed)
PATIENT NAME:  [name], [name] MR#:  [id number] DATE OF BIRTH:  1951/05/02  DATE OF ADMISSION:  09/15/2014 DATE OF DISCHARGE:  09/16/2014  For a detailed note, please take a look at the history and physical done on admission by Dr. Valentino Nose.   DISCHARGE DIAGNOSES:  1.  Right-sided weakness, now resolved. Cerebrovascular accident ruled out. 2.  Bilateral heel ulcers. 3.  Osteomyelitis. 4.  Diabetes. 5.  Hypertension. 6.  Hyperlipidemia.  DISCHARGE DIET: The patient is being discharged on a low-sodium, low-fat, carb-controlled diet.   DISCHARGE ACTIVITY: As tolerated.   DISCHARGE FOLLOWUP: Followup is with Dr. Denton Lank in the next 1 to 2 weeks.  DISCHARGE MEDICATIONS: Amitriptyline 50 mg at bedtime, metformin 500 mg b.i.d., vitamin C 500 mg b.i.d., aspirin 81 mg daily, calcium vitamin D 1 tab daily, lovastatin 40 mg daily, metoprolol tartrate 50 mg b.i.d., Senokot 1 tab b.i.d., zinc sulfate 220 mg daily, Aleve 220 mg q. 8 hours as needed, multivitamin daily, oxybutynin 50 mg at bedtime, IV Zosyn 4.5 grams IV q. 8 hours.   [place] COURSE: None.   PERTINENT STUDIES DONE DURING THE HOSPITAL COURSE: CT scan of the head done without contrast on admission showed no acute abnormality, small right parietal calvarium osteoma.   Chest x-ray done on admission showed no acute cardiopulmonary disease.  Carotid duplex showed no evidence of any hemodynamically significant carotid artery stenosis with antegrade flow in both vertebrals.   MRI of the brain showed chronic microvascular change, but no acute intracranial abnormality.   Two-dimensional echocardiogram: Results are still pending.  BRIEF HOSPITAL COURSE: This is a 64 year old female with medical problems as mentioned above who presented to the hospital with right-sided facial droop and weakness. 1.  Right-sided facial droop and weakness. The patient's working diagnosis on admission was a possible CVA, although the  patient underwent extensive work-up for CVA including a CT of head, MRI of the brain, and a carotid duplex, all of which have been negative. Her clinical symptoms have now improved and are back to baseline. She is therefore being discharged back to her skilled nursing facility. This could possibly be secondary to a TIA or even deconditioning as she is chronically bedbound. For now she will continue her aspirin and statin.  2.  Chronic heel ulcers, status post wound VAC, and osteomyelitis. The patient has a PICC line is currently getting IV Zosyn. She will continue that. The patient follows up with Dr. Elvina Mattes and Dr. Ola Spurr as an outpatient.  3.  Diabetes. The patient was maintained on sliding scale insulin, but will continue her metformin upon discharge.  4.  Hypertension. The patient was maintained on her metoprolol. She will continue that. 5.  Hyperlipidemia. The patient was maintained on lovastatin. She will continue that.   CODE STATUS: The patient is a FULL code.   TIME SPENT: 40 minutes.   ____________________________ Belia Heman. Verdell Carmine, MD vjs:sb D: 09/16/2014 12:07:35 ET T: 09/16/2014 12:44:26 ET JOB#: [id number]  cc: Belia Heman. Verdell Carmine, MD, <Dictator> Sarah "Sallie" Posey Pronto, MD  Henreitta Leber MD ELECTRONICALLY SIGNED 09/16/2014 16:52

## 2014-09-29 ENCOUNTER — Ambulatory Visit: Payer: Medicare Other | Admitting: Infectious Disease

## 2015-06-30 ENCOUNTER — Other Ambulatory Visit: Payer: Self-pay | Admitting: Family Medicine

## 2015-06-30 DIAGNOSIS — R51 Headache: Principal | ICD-10-CM

## 2015-06-30 DIAGNOSIS — R519 Headache, unspecified: Secondary | ICD-10-CM

## 2015-07-06 ENCOUNTER — Ambulatory Visit
Admission: RE | Admit: 2015-07-06 | Discharge: 2015-07-06 | Disposition: A | Discharge status: Home / Self Care | Payer: Medicare Other | Source: Ambulatory Visit | Attending: Family Medicine | Admitting: Family Medicine

## 2015-07-06 DIAGNOSIS — R51 Headache: Secondary | ICD-10-CM | POA: Diagnosis not present

## 2015-07-06 DIAGNOSIS — G8929 Other chronic pain: Secondary | ICD-10-CM

## 2015-09-03 ENCOUNTER — Encounter: Payer: Self-pay | Admitting: *Deleted

## 2015-09-07 ENCOUNTER — Ambulatory Visit
Admission: RE | Admit: 2015-09-07 | Discharge: 2015-09-07 | Disposition: A | Discharge status: Home / Self Care | Payer: Medicare Other | Source: Ambulatory Visit | Attending: Unknown Physician Specialty | Admitting: Unknown Physician Specialty

## 2015-09-07 ENCOUNTER — Encounter
Admission: RE | Disposition: A | Discharge status: Home / Self Care | Payer: Self-pay | Source: Ambulatory Visit | Attending: Unknown Physician Specialty

## 2015-09-07 ENCOUNTER — Ambulatory Visit: Payer: Medicare Other | Admitting: Anesthesiology

## 2015-09-07 DIAGNOSIS — K640 First degree hemorrhoids: Secondary | ICD-10-CM | POA: Insufficient documentation

## 2015-09-07 DIAGNOSIS — Z85038 Personal history of other malignant neoplasm of large intestine: Secondary | ICD-10-CM | POA: Insufficient documentation

## 2015-09-07 DIAGNOSIS — Z6841 Body Mass Index (BMI) 40.0 and over, adult: Secondary | ICD-10-CM | POA: Diagnosis not present

## 2015-09-07 DIAGNOSIS — I10 Essential (primary) hypertension: Secondary | ICD-10-CM | POA: Diagnosis not present

## 2015-09-07 DIAGNOSIS — Z79899 Other long term (current) drug therapy: Secondary | ICD-10-CM | POA: Insufficient documentation

## 2015-09-07 DIAGNOSIS — Z1211 Encounter for screening for malignant neoplasm of colon: Secondary | ICD-10-CM | POA: Diagnosis not present

## 2015-09-07 DIAGNOSIS — Z853 Personal history of malignant neoplasm of breast: Secondary | ICD-10-CM | POA: Diagnosis not present

## 2015-09-07 DIAGNOSIS — D124 Benign neoplasm of descending colon: Secondary | ICD-10-CM | POA: Diagnosis not present

## 2015-09-07 DIAGNOSIS — E119 Type 2 diabetes mellitus without complications: Secondary | ICD-10-CM | POA: Diagnosis not present

## 2015-09-07 DIAGNOSIS — Z7982 Long term (current) use of aspirin: Secondary | ICD-10-CM | POA: Insufficient documentation

## 2015-09-07 DIAGNOSIS — Z7984 Long term (current) use of oral hypoglycemic drugs: Secondary | ICD-10-CM | POA: Diagnosis not present

## 2015-09-07 DIAGNOSIS — D125 Benign neoplasm of sigmoid colon: Secondary | ICD-10-CM | POA: Insufficient documentation

## 2015-09-07 HISTORY — DX: Malignant neoplasm of unspecified site of unspecified female breast: C50.919

## 2015-09-07 HISTORY — PX: COLONOSCOPY WITH PROPOFOL: SHX5780

## 2015-09-07 HISTORY — DX: Malignant neoplasm of colon, unspecified: C18.9

## 2015-09-07 HISTORY — DX: Type 2 diabetes mellitus without complications: E11.9

## 2015-09-07 HISTORY — DX: Osteomyelitis, unspecified: M86.9

## 2015-09-07 HISTORY — DX: Age-related osteoporosis without current pathological fracture: M81.0

## 2015-09-07 HISTORY — DX: Morbid (severe) obesity due to excess calories: E66.01

## 2015-09-07 HISTORY — DX: Essential (primary) hypertension: I10

## 2015-09-07 HISTORY — DX: Cardiac murmur, unspecified: R01.1

## 2015-09-07 HISTORY — DX: Endocarditis, valve unspecified: I38

## 2015-09-07 HISTORY — DX: Personal history of other diseases of the digestive system: Z87.19

## 2015-09-07 HISTORY — DX: Iron deficiency anemia, unspecified: D50.9

## 2015-09-07 HISTORY — DX: Type 2 diabetes mellitus with diabetic neuropathic arthropathy: E11.610

## 2015-09-07 HISTORY — DX: Personal history of other specified conditions: Z87.898

## 2015-09-07 LAB — GLUCOSE, CAPILLARY: Glucose-Capillary: 147 mg/dL — ABNORMAL HIGH (ref 65–99)

## 2015-09-07 SURGERY — COLONOSCOPY WITH PROPOFOL
Anesthesia: General

## 2015-09-07 MED ORDER — LACTATED RINGERS IV SOLN
INTRAVENOUS | Status: DC | PRN
  Administered 2015-09-07: 11:00:00 via INTRAVENOUS

## 2015-09-07 MED ORDER — SODIUM CHLORIDE 0.9 % IV SOLN
INTRAVENOUS | Status: DC
  Administered 2015-09-07: 1000 mL via INTRAVENOUS

## 2015-09-07 MED ORDER — PROPOFOL 500 MG/50ML IV EMUL
INTRAVENOUS | Status: DC | PRN
  Administered 2015-09-07: 100 ug/kg/min via INTRAVENOUS

## 2015-09-07 MED ORDER — SODIUM CHLORIDE 0.9 % IV SOLN: INTRAVENOUS | Status: DC

## 2015-09-07 MED ORDER — PHENYLEPHRINE HCL 10 MG/ML IJ SOLN
INTRAMUSCULAR | Status: DC | PRN
  Administered 2015-09-07: 50 ug via INTRAVENOUS
  Administered 2015-09-07 (×2): 100 ug via INTRAVENOUS

## 2015-09-07 MED ORDER — EPHEDRINE SULFATE 50 MG/ML IJ SOLN
INTRAMUSCULAR | Status: DC | PRN
Start: 2015-09-07 — End: 2015-09-07
  Administered 2015-09-07: 10 mg via INTRAVENOUS

## 2015-09-07 MED ORDER — PROPOFOL 10 MG/ML IV BOLUS
INTRAVENOUS | Status: DC | PRN
  Administered 2015-09-07 (×2): 50 mg via INTRAVENOUS

## 2015-09-07 NOTE — Op Note (Signed)
[facility] Patient Name: [name] Procedure Date: 09/07/2015 10:29 AM MRN: [id number] Account #: [id number] Date of Birth: Oct 20, 1950 Admit Type: Outpatient Age: 65 Room: [facility] ENDO ROOM 1 Gender: Female Note Status: Finalized Procedure:            Colonoscopy Indications:          High risk colon cancer surveillance: Personal history                        of colon cancer Providers:            Manya Silvas, MD Referring MD:         Denton Lank, MD (Referring MD) Medicines:            Propofol per Anesthesia Complications:        No immediate complications. Procedure:            Pre-Anesthesia Assessment:                       - After reviewing the risks and benefits, the patient                        was deemed in satisfactory condition to undergo the                        procedure.                       After obtaining informed consent, the colonoscope was                        passed under direct vision. Throughout the procedure,                        the patient's blood pressure, pulse, and oxygen                        saturations were monitored continuously. The                        Colonoscope was introduced through the anus and                        advanced to the the ileocolonic anastomosis. The                        colonoscopy was somewhat difficult due to a tortuous                        colon. Successful completion of the procedure was aided                        by applying abdominal pressure. The patient tolerated                        the procedure well. The quality of the bowel                        preparation was good. Findings:      The ileocolonic anastamosis was wide open.      Six sessile polyps were found in  the sigmoid colon and descending colon.       The polyps were diminutive in size. These polyps were removed with a       jumbo cold forceps. Resection and retrieval were complete.      Internal  hemorrhoids were found during endoscopy. The hemorrhoids were       medium-sized and Grade I (internal hemorrhoids that do not prolapse). Impression:           - Six diminutive polyps in the sigmoid colon and in the                        descending colon, removed with a jumbo cold forceps.                        Resected and retrieved.                       - Internal hemorrhoids. Recommendation:       - Await pathology results. Manya Silvas, MD 09/07/2015 11:09:56 AM This report has been signed electronically. Number of Addenda: 0 Note Initiated On: 09/07/2015 10:29 AM Scope Withdrawal Time: 0 hours 16 minutes 6 seconds  Total Procedure Duration: 0 hours 27 minutes 11 seconds       [facility]

## 2015-09-07 NOTE — Anesthesia Preprocedure Evaluation (Signed)
Anesthesia Evaluation  Patient identified by MRN, date of birth, ID band Patient awake    Reviewed: Allergy & Precautions, H&P , NPO status , Patient's Chart, lab work & pertinent test results, reviewed documented beta blocker date and time   Airway Mallampati: III   Neck ROM: full    Dental  (+) Poor Dentition, Teeth Intact   Pulmonary neg pulmonary ROS,    Pulmonary exam normal        Cardiovascular hypertension, negative cardio ROS Normal cardiovascular exam+ Valvular Problems/Murmurs  Rate:Normal     Neuro/Psych negative neurological ROS  negative psych ROS   GI/Hepatic negative GI ROS, Neg liver ROS, hiatal hernia,   Endo/Other  negative endocrine ROSdiabetesMorbid obesity  Renal/GU negative Renal ROS  negative genitourinary   Musculoskeletal   Abdominal   Peds  Hematology negative hematology ROS (+) anemia ,   Anesthesia Other Findings Past Medical History:   Breast cancer ([place])                                            Comment:left   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[place])                  Colon cancer ([place])                                           Type 2 diabetes mellitus without complication *              Heart murmur                                                 History of hiatal hernia                                     History of tachycardia                                       Hypertension                                                 Iron deficiency anemia                                       Morbid obesity (HCC)                                         Osteomyelitis of ankle and foot (HCC)                        Osteoporosis, post-menopausal  Valvular heart disease                                     Past Surgical History:   MASTECTOMY                                      Left 1992           Comment:Left mastectomy with right breast reduction for         breast cancer   LAPAROSCOPIC RIGHT COLECTOMY                     2002         TONSILLECTOMY                                                 KNEE ARTHROSCOPY                                Left              OTHER SURGICAL HISTORY                                          Comment:Gastric Sleeve   COLONOSCOPY                                                   ESOPHAGOGASTRODUODENOSCOPY                                    Reproductive/Obstetrics                             Anesthesia Physical Anesthesia Plan  ASA: III  Anesthesia Plan: General   Post-op Pain Management:    Induction:   Airway Management Planned:   Additional Equipment:   Intra-op Plan:   Post-operative Plan:   Informed Consent: I have reviewed the patients History and Physical, chart, labs and discussed the procedure including the risks, benefits and alternatives for the proposed anesthesia with the patient or authorized representative who has indicated his/her understanding and acceptance.   Dental Advisory Given  Plan Discussed with: CRNA  Anesthesia Plan Comments:         Anesthesia Quick Evaluation

## 2015-09-07 NOTE — H&P (Signed)
Primary Care Physician:  Baltazar Apo, MD Primary Gastroenterologist:  Dr. Vira Agar  Pre-Procedure History & Physical: HPI:  [name] is a 65 y.o. female is here for an colonoscopy.   Past Medical History  Diagnosis Date  . Breast cancer ([place])     left  . 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 Colon cancer ([place])   . Type 2 diabetes mellitus without complication ([place])   . Heart murmur   . History of hiatal hernia   . History of tachycardia   . Hypertension   . Iron deficiency anemia   . Morbid obesity ([place])   . Osteomyelitis of ankle and foot ([place])   . Osteoporosis, post-menopausal   . Valvular heart disease     Past Surgical History  Procedure Laterality Date  . Mastectomy Left 1992    Left mastectomy with right breast reduction for breast cancer  . Laparoscopic right colectomy  2002  . Tonsillectomy    . Knee arthroscopy Left   . Other surgical history      Gastric Sleeve  . Colonoscopy    . Esophagogastroduodenoscopy      Prior to Admission medications   Medication Sig Start Date End Date Taking? Authorizing Provider  amitriptyline (ELAVIL) 50 MG tablet Take 50 mg by mouth at bedtime.   Yes Historical Provider, MD  amLODipine (NORVASC) 5 MG tablet Take 5 mg by mouth daily.   Yes Historical Provider, MD  aspirin 81 MG tablet Take 81 mg by mouth daily.   Yes Historical Provider, MD  azelastine (ASTELIN) 0.1 % nasal spray Place 1 spray into both nostrils 2 (two) times daily. Use in each nostril as directed   Yes Historical Provider, MD  fluticasone (FLONASE) 50 MCG/ACT nasal spray Place 2 sprays into both nostrils daily.   Yes Historical Provider, MD  lovastatin (ALTOPREV) 40 MG 24 hr tablet Take 40 mg by mouth at bedtime.   Yes Historical Provider, MD  metFORMIN (GLUCOPHAGE) 500 MG tablet Take 500 mg by mouth 2 (two) times daily with a meal.   Yes Historical Provider, MD  metoprolol (LOPRESSOR) 100 MG tablet Take 200 mg by mouth 2 (two) times daily.    Yes Historical Provider, MD  omeprazole (PRILOSEC) 20 MG capsule Take 20 mg by mouth daily.   Yes Historical Provider, MD  oxybutynin (DITROPAN XL) 15 MG 24 hr tablet Take 15 mg by mouth at bedtime.   Yes Historical Provider, MD    Allergies as of 08/31/2015  . (Not on File)    History reviewed. No pertinent family history.  Social History   Social History  . Marital Status: Married    Spouse Name: N/A  . Number of Children: N/A  . Years of Education: N/A   Occupational History  . Not on file.   Social History Main Topics  . Smoking status: Never Smoker   . Smokeless tobacco: Not on file  . Alcohol Use: No  . Drug Use: No  . Sexual Activity: Not on file   Other Topics Concern  . Not on file   Social History Narrative    Review of Systems: See HPI, otherwise negative ROS  Physical Exam: BP 162/74 mmHg  Pulse 95  Temp(Src) 98.7 F (37.1 C) (Tympanic)  Resp 20  Ht 5\' 7"  (1.702 m)  Wt 127.914 kg (282 lb)  BMI 44.16 kg/m2  SpO2 100% General:   Alert,  pleasant and cooperative in NAD Head:  Normocephalic  and atraumatic. Neck:  Supple; no masses or thyromegaly. Lungs:  Clear throughout to auscultation.    Heart:  Regular rate and rhythm. Abdomen:  Soft, nontender and nondistended. Normal bowel sounds, without guarding, and without rebound.   Neurologic:  Alert and  oriented x4;  grossly normal neurologically.  Impression/Plan: [name] is here for an colonoscopy to be performed for [facility] colon cancer  Risks, benefits, limitations, and alternatives regarding  colonoscopy have been reviewed with the patient.  Questions have been answered.  All parties agreeable.   Gaylyn Cheers, MD  09/07/2015, 10:32 AM

## 2015-09-07 NOTE — Anesthesia Postprocedure Evaluation (Signed)
Anesthesia Post Note  Patient: [name]  Procedure(s) Performed: Procedure(s) (LRB): COLONOSCOPY WITH PROPOFOL (N/A)  Patient location during evaluation: PACU Anesthesia Type: General Level of consciousness: awake and alert Pain management: pain level controlled Vital Signs Assessment: post-procedure vital signs reviewed and stable Respiratory status: spontaneous breathing, nonlabored ventilation, respiratory function stable and patient connected to nasal cannula oxygen Cardiovascular status: blood pressure returned to baseline and stable Postop Assessment: no signs of nausea or vomiting Anesthetic complications: no    Last Vitals:  Filed Vitals:   09/07/15 1120 09/07/15 1130  BP: 122/70 116/61  Pulse: 96 87  Temp:    Resp: 22 24    Last Pain: There were no vitals filed for this visit.               Molli Barrows

## 2015-09-07 NOTE — Transfer of Care (Signed)
Immediate Anesthesia Transfer of Care Note  Patient: [name]  Procedure(s) Performed: Procedure(s): COLONOSCOPY WITH PROPOFOL (N/A)  Patient Location: PACU and Endoscopy Unit  Anesthesia Type:General  Level of Consciousness: awake, alert  and oriented  Airway & Oxygen Therapy: Patient Spontanous Breathing and Patient connected to nasal cannula oxygen  Post-op Assessment: Report given to RN and Post -op Vital signs reviewed and stable  Post vital signs: Reviewed and stable  Last Vitals:  Filed Vitals:   09/07/15 0927  BP: 162/74  Pulse: 95  Temp: 37.1 C  Resp: 20    Complications: No apparent anesthesia complications

## 2015-09-09 LAB — SURGICAL PATHOLOGY

## 2015-09-28 ENCOUNTER — Other Ambulatory Visit: Payer: Self-pay | Admitting: Family Medicine

## 2015-09-28 DIAGNOSIS — Z1231 Encounter for screening mammogram for malignant neoplasm of breast: Secondary | ICD-10-CM

## 2015-10-15 ENCOUNTER — Ambulatory Visit: Payer: Medicare Other

## 2015-11-05 ENCOUNTER — Ambulatory Visit
Admission: RE | Admit: 2015-11-05 | Discharge: 2015-11-05 | Disposition: A | Discharge status: Home / Self Care | Payer: Medicare Other | Source: Ambulatory Visit | Attending: Family Medicine | Admitting: Family Medicine

## 2015-11-05 DIAGNOSIS — Z1231 Encounter for screening mammogram for malignant neoplasm of breast: Secondary | ICD-10-CM | POA: Insufficient documentation

## 2016-01-13 ENCOUNTER — Encounter: Payer: Self-pay | Admitting: *Deleted

## 2016-01-26 ENCOUNTER — Encounter
Admission: RE | Disposition: A | Discharge status: Nursing Home (Includes ICF) | Payer: Self-pay | Source: Ambulatory Visit | Attending: Ophthalmology

## 2016-01-26 ENCOUNTER — Ambulatory Visit: Payer: Medicare Other | Admitting: Anesthesiology

## 2016-01-26 ENCOUNTER — Ambulatory Visit
Admission: RE | Admit: 2016-01-26 | Discharge: 2016-01-26 | Disposition: A | Discharge status: Nursing Home (Includes ICF) | Payer: Medicare Other | Source: Ambulatory Visit | Attending: Ophthalmology | Admitting: Ophthalmology

## 2016-01-26 ENCOUNTER — Encounter: Payer: Self-pay | Admitting: *Deleted

## 2016-01-26 DIAGNOSIS — I10 Essential (primary) hypertension: Secondary | ICD-10-CM | POA: Insufficient documentation

## 2016-01-26 DIAGNOSIS — E119 Type 2 diabetes mellitus without complications: Secondary | ICD-10-CM | POA: Insufficient documentation

## 2016-01-26 DIAGNOSIS — Z7984 Long term (current) use of oral hypoglycemic drugs: Secondary | ICD-10-CM | POA: Diagnosis not present

## 2016-01-26 DIAGNOSIS — Z6841 Body Mass Index (BMI) 40.0 and over, adult: Secondary | ICD-10-CM | POA: Diagnosis not present

## 2016-01-26 DIAGNOSIS — H2511 Age-related nuclear cataract, right eye: Secondary | ICD-10-CM | POA: Diagnosis not present

## 2016-01-26 HISTORY — PX: CATARACT EXTRACTION W/PHACO: SHX586

## 2016-01-26 HISTORY — DX: Edema, unspecified: R60.9

## 2016-01-26 HISTORY — DX: Gastro-esophageal reflux disease without esophagitis: K21.9

## 2016-01-26 HISTORY — DX: Reserved for inherently not codable concepts without codable children: IMO0001

## 2016-01-26 HISTORY — DX: Cardiac arrhythmia, unspecified: I49.9

## 2016-01-26 LAB — GLUCOSE, CAPILLARY: GLUCOSE-CAPILLARY: 144 mg/dL — AB (ref 65–99)

## 2016-01-26 SURGERY — PHACOEMULSIFICATION, CATARACT, WITH IOL INSERTION
Anesthesia: Monitor Anesthesia Care | Site: Eye | Laterality: Right | Wound class: Clean

## 2016-01-26 MED ORDER — SODIUM CHLORIDE 0.9 % IV SOLN
INTRAVENOUS | Status: DC
  Administered 2016-01-26 (×2): via INTRAVENOUS

## 2016-01-26 MED ORDER — MIDAZOLAM HCL 2 MG/2ML IJ SOLN
INTRAMUSCULAR | Status: DC | PRN
  Administered 2016-01-26: 1 mg via INTRAVENOUS

## 2016-01-26 MED ORDER — ARMC OPHTHALMIC DILATING GEL
1.0000 "application " | OPHTHALMIC | Status: AC | PRN
  Administered 2016-01-26 (×2): 1 via OPHTHALMIC

## 2016-01-26 MED ORDER — MOXIFLOXACIN HCL 0.5 % OP SOLN
OPHTHALMIC | Status: DC | PRN
  Administered 2016-01-26: 1 [drp] via OPHTHALMIC

## 2016-01-26 MED ORDER — POVIDONE-IODINE 5 % OP SOLN
1.0000 "application " | Freq: Once | OPHTHALMIC | Status: AC
  Administered 2016-01-26: 1 via OPHTHALMIC

## 2016-01-26 MED ORDER — TETRACAINE HCL 0.5 % OP SOLN
1.0000 [drp] | Freq: Once | OPHTHALMIC | Status: AC
  Administered 2016-01-26: 1 [drp] via OPHTHALMIC

## 2016-01-26 MED ORDER — FENTANYL CITRATE (PF) 100 MCG/2ML IJ SOLN
INTRAMUSCULAR | Status: DC | PRN
  Administered 2016-01-26: 50 ug via INTRAVENOUS

## 2016-01-26 MED ORDER — NA CHONDROIT SULF-NA HYALURON 40-17 MG/ML IO SOLN
INTRAOCULAR | Status: AC
  Filled 2016-01-26: qty 1

## 2016-01-26 MED ORDER — EPINEPHRINE HCL 1 MG/ML IJ SOLN
INTRAOCULAR | Status: DC | PRN
  Administered 2016-01-26: 1 mL via OPHTHALMIC

## 2016-01-26 MED ORDER — PHENYLEPHRINE HCL 10 % OP SOLN
1.0000 [drp] | OPHTHALMIC | Status: DC | PRN
  Administered 2016-01-26 (×2): 1 [drp] via OPHTHALMIC

## 2016-01-26 MED ORDER — NA CHONDROIT SULF-NA HYALURON 40-17 MG/ML IO SOLN
INTRAOCULAR | Status: DC | PRN
  Administered 2016-01-26: 1 mL via INTRAOCULAR

## 2016-01-26 MED ORDER — MOXIFLOXACIN HCL 0.5 % OP SOLN: 1.0000 [drp] | OPHTHALMIC | Status: DC | PRN

## 2016-01-26 MED ORDER — CEFUROXIME OPHTHALMIC INJECTION 1 MG/0.1 ML
INJECTION | OPHTHALMIC | Status: AC
  Filled 2016-01-26: qty 0.1

## 2016-01-26 MED ORDER — CARBACHOL 0.01 % IO SOLN
INTRAOCULAR | Status: DC | PRN
  Administered 2016-01-26: .5 mL via INTRAOCULAR

## 2016-01-26 MED ORDER — EPINEPHRINE HCL 1 MG/ML IJ SOLN
INTRAMUSCULAR | Status: AC
  Filled 2016-01-26: qty 1

## 2016-01-26 SURGICAL SUPPLY — 21 items
CANNULA ANT/CHMB 27GA (MISCELLANEOUS) ×3 IMPLANT
CUP MEDICINE 2OZ PLAST GRAD ST (MISCELLANEOUS) ×3 IMPLANT
GLOVE BIO SURGEON STRL SZ8 (GLOVE) ×3 IMPLANT
GLOVE BIOGEL M 6.5 STRL (GLOVE) ×3 IMPLANT
GLOVE SURG LX 8.0 MICRO (GLOVE) ×2
GLOVE SURG LX STRL 8.0 MICRO (GLOVE) ×1 IMPLANT
GOWN STRL REUS W/ TWL LRG LVL3 (GOWN DISPOSABLE) ×2 IMPLANT
GOWN STRL REUS W/TWL LRG LVL3 (GOWN DISPOSABLE) ×4
LENS IOL ACRYSOF IQ 22.0 (Intraocular Lens) ×3 IMPLANT
PACK CATARACT (MISCELLANEOUS) ×3 IMPLANT
PACK CATARACT BRASINGTON LX (MISCELLANEOUS) ×3 IMPLANT
PACK EYE AFTER SURG (MISCELLANEOUS) ×3 IMPLANT
SOL BSS BAG (MISCELLANEOUS) ×3
SOL PREP PVP 2OZ (MISCELLANEOUS) ×3
SOLUTION BSS BAG (MISCELLANEOUS) ×1 IMPLANT
SOLUTION PREP PVP 2OZ (MISCELLANEOUS) ×1 IMPLANT
SYR 3ML LL SCALE MARK (SYRINGE) ×3 IMPLANT
SYR 5ML LL (SYRINGE) ×3 IMPLANT
SYR TB 1ML 27GX1/2 LL (SYRINGE) ×3 IMPLANT
WATER STERILE IRR 250ML POUR (IV SOLUTION) ×3 IMPLANT
WIPE NON LINTING 3.25X3.25 (MISCELLANEOUS) ×3 IMPLANT

## 2016-01-26 NOTE — Discharge Instructions (Signed)
Eye Surgery Discharge Instructions  Expect mild scratchy sensation or mild soreness. DO NOT RUB YOUR EYE!  The day of surgery:  Minimal physical activity, but bed rest is not required  No reading, computer work, or close hand work  No bending, lifting, or straining.  May watch TV  For 24 hours:  No driving, legal decisions, or alcoholic beverages  Safety precautions  Eat anything you prefer: It is better to start with liquids, then soup then solid foods.  _____ Eye patch should be worn until postoperative exam tomorrow.  ____ Solar shield eyeglasses should be worn for comfort in the sunlight/patch while sleeping  Resume all regular medications including aspirin or Coumadin if these were discontinued prior to surgery. You may shower, bathe, shave, or wash your hair. Tylenol may be taken for mild discomfort.  Call your doctor if you experience significant pain, nausea, or vomiting, fever > 101 or other signs of infection. [phone] or [phone] Specific instructions:  Follow-up Information    Tim Lair, MD .   Specialty:  Ophthalmology Why:  September 6 at 10:45am Contact information: [place] [address] [phone]

## 2016-01-26 NOTE — Transfer of Care (Signed)
Immediate Anesthesia Transfer of Care Note  Patient: [name]  Procedure(s) Performed: Procedure(s) with comments: CATARACT EXTRACTION PHACO AND INTRAOCULAR LENS PLACEMENT (IOC) (Right) - [place]  00:26 AP% 21.5 CDE 5.70 Fluid pack lot # [id number]  Patient Location: PACU  Anesthesia Type:MAC  Level of Consciousness: awake  Airway & Oxygen Therapy: Patient Spontanous Breathing  Post-op Assessment: Report given to RN  Post vital signs: Reviewed and stable  Last Vitals:  Vitals:   01/26/16 0842 01/26/16 1020  BP: (!) 164/82   Pulse: 98 81  Resp: 18 17  Temp: 36.6 C 36.7 C    Last Pain:  Vitals:   01/26/16 1020  TempSrc: Oral         Complications: No apparent anesthesia complications

## 2016-01-26 NOTE — Anesthesia Postprocedure Evaluation (Addendum)
Anesthesia Post Note  Patient: [name]  Procedure(s) Performed: Procedure(s) (LRB): CATARACT EXTRACTION PHACO AND INTRAOCULAR LENS PLACEMENT (IOC) (Right)  Patient location during evaluation: PACU Anesthesia Type: MAC Level of consciousness: awake Pain management: pain level controlled Respiratory status: spontaneous breathing Cardiovascular status: blood pressure returned to baseline and stable Postop Assessment: no headache Anesthetic complications: no    Last Vitals:  Vitals:   01/26/16 0842 01/26/16 1020  BP: (!) 164/82 (!) 147/69  Pulse: 98 81  Resp: 18 17  Temp: 36.6 C 36.7 C    Last Pain:  Vitals:   01/26/16 1020  TempSrc: Oral                 Buckner [place]

## 2016-01-26 NOTE — Anesthesia Preprocedure Evaluation (Signed)
Anesthesia Evaluation  Patient identified by MRN, date of birth, ID band Patient awake    Reviewed: Allergy & Precautions, H&P , NPO status , Patient's Chart, lab work & pertinent test results, reviewed documented beta blocker date and time   Airway Mallampati: III   Neck ROM: full    Dental  (+) Poor Dentition, Teeth Intact   Pulmonary neg pulmonary ROS, shortness of breath and with exertion,    Pulmonary exam normal        Cardiovascular hypertension, Pt. on medications and Pt. on home beta blockers negative cardio ROS Normal cardiovascular exam+ dysrhythmias + Valvular Problems/Murmurs  Rate:Normal     Neuro/Psych negative neurological ROS  negative psych ROS   GI/Hepatic negative GI ROS, Neg liver ROS, hiatal hernia, GERD  Medicated,  Endo/Other  negative endocrine ROSdiabetes, Well Controlled, Type 2, Oral Hypoglycemic AgentsMorbid obesity  Renal/GU negative Renal ROS  negative genitourinary   Musculoskeletal  (+) Arthritis , Osteoarthritis,    Abdominal   Peds negative pediatric ROS (+)  Hematology negative hematology ROS (+) anemia ,   Anesthesia Other Findings Past Medical History:   Breast cancer ([place])                                            Comment:left   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[place])                  Colon cancer ([place])                                           Type 2 diabetes mellitus without complication *              Heart murmur                                                 History of hiatal hernia                                     History of tachycardia                                       Hypertension                                                 Iron deficiency anemia                                       Morbid obesity (HCC)                                         Osteomyelitis of ankle and  foot ([place])                        Osteoporosis, post-menopausal                                 Valvular heart disease                                     Past Surgical History:   MASTECTOMY                                      Left 1992           Comment:Left mastectomy with right breast reduction for              breast cancer   LAPAROSCOPIC RIGHT COLECTOMY                     2002         TONSILLECTOMY                                                 KNEE ARTHROSCOPY                                Left              OTHER SURGICAL HISTORY                                          Comment:Gastric Sleeve   COLONOSCOPY                                                   ESOPHAGOGASTRODUODENOSCOPY                                    Reproductive/Obstetrics                             Anesthesia Physical  Anesthesia Plan  ASA: III  Anesthesia Plan: MAC   Post-op Pain Management:    Induction:   Airway Management Planned:   Additional Equipment:   Intra-op Plan:   Post-operative Plan:   Informed Consent: I have reviewed the patients History and Physical, chart, labs and discussed the procedure including the risks, benefits and alternatives for the proposed anesthesia with the patient or authorized representative who has indicated his/her understanding and acceptance.   Dental Advisory Given  Plan Discussed with: CRNA and Surgeon  Anesthesia Plan Comments:         Anesthesia Quick Evaluation

## 2016-01-26 NOTE — Op Note (Signed)
PREOPERATIVE DIAGNOSIS:  Nuclear sclerotic cataract of the right eye.   POSTOPERATIVE DIAGNOSIS:  nuclear sclerotic cataract right eye   OPERATIVE PROCEDURE: Procedure(s): CATARACT EXTRACTION PHACO AND INTRAOCULAR LENS PLACEMENT (IOC)   SURGEON:  Birder Robson, MD.   ANESTHESIA:  Anesthesiologist: Alvin Critchley, MD CRNA: Allean Found, CRNA  1.      Managed anesthesia care. 2.      Topical tetracaine drops followed by 2% Xylocaine jelly applied in the preoperative holding area.   COMPLICATIONS:  None.   TECHNIQUE:   Stop and chop   DESCRIPTION OF PROCEDURE:  The patient was examined and consented in the preoperative holding area where the aforementioned topical anesthesia was applied to the right eye and then brought back to the Operating Room where the right eye was prepped and draped in the usual sterile ophthalmic fashion and a lid speculum was placed. A paracentesis was created with the side port blade and the anterior chamber was filled with viscoelastic. A near clear corneal incision was performed with the steel keratome. A continuous curvilinear capsulorrhexis was performed with a cystotome followed by the capsulorrhexis forceps. Hydrodissection and hydrodelineation were carried out with BSS on a blunt cannula. The lens was removed in a stop and chop  technique and the remaining cortical material was removed with the irrigation-aspiration handpiece. The capsular bag was inflated with viscoelastic and the Technis [id number]  lens was placed in the capsular bag without complication. The remaining viscoelastic was removed from the eye with the irrigation-aspiration handpiece. The wounds were hydrated. The anterior chamber was flushed with Miostat and the eye was inflated to physiologic pressure. 0.2 mL of Vigamox diluted three/one with BSS was placed in the anterior chamber. The wounds were found to be water tight. The eye was dressed with Vigamox. The patient was given protective glasses to  wear throughout the day and a shield with which to sleep tonight. The patient was also given drops with which to begin a drop regimen today and will follow-up with me in one day. * No implants in log * Procedure(s) with comments: CATARACT EXTRACTION PHACO AND INTRAOCULAR LENS PLACEMENT (IOC) (Right) - [place]  00:26 AP% 21.5 CDE 5.70 Fluid pack lot # [id number]  Electronically signed: [place] 01/26/2016 10:18 AM

## 2016-01-26 NOTE — H&P (Signed)
  All labs reviewed. Abnormal studies sent to patients PCP when indicated.  Previous H&P reviewed, patient examined, there are NO CHANGES.  [name] LOUIS9/5/20179:16 AM

## 2016-02-29 ENCOUNTER — Encounter: Payer: Self-pay | Admitting: Ophthalmology

## 2016-09-28 ENCOUNTER — Emergency Department
Admission: EM | Admit: 2016-09-28 | Discharge: 2016-09-28 | Disposition: A | Discharge status: Home / Self Care | Payer: Medicare Other | Attending: Emergency Medicine | Admitting: Emergency Medicine

## 2016-09-28 ENCOUNTER — Encounter: Payer: Self-pay | Admitting: Emergency Medicine

## 2016-09-28 DIAGNOSIS — M545 Low back pain, unspecified: Secondary | ICD-10-CM

## 2016-09-28 DIAGNOSIS — I10 Essential (primary) hypertension: Secondary | ICD-10-CM | POA: Diagnosis not present

## 2016-09-28 DIAGNOSIS — N3000 Acute cystitis without hematuria: Secondary | ICD-10-CM

## 2016-09-28 DIAGNOSIS — Z85038 Personal history of other malignant neoplasm of large intestine: Secondary | ICD-10-CM | POA: Insufficient documentation

## 2016-09-28 DIAGNOSIS — Z853 Personal history of malignant neoplasm of breast: Secondary | ICD-10-CM | POA: Diagnosis not present

## 2016-09-28 DIAGNOSIS — E119 Type 2 diabetes mellitus without complications: Secondary | ICD-10-CM | POA: Insufficient documentation

## 2016-09-28 LAB — URINALYSIS, COMPLETE (UACMP) WITH MICROSCOPIC
BILIRUBIN URINE: NEGATIVE
Glucose, UA: NEGATIVE mg/dL
HGB URINE DIPSTICK: NEGATIVE
KETONES UR: NEGATIVE mg/dL
Leukocytes, UA: NEGATIVE
NITRITE: POSITIVE — AB
PH: 8 (ref 5.0–8.0)
PROTEIN: NEGATIVE mg/dL
Specific Gravity, Urine: 1.004 — ABNORMAL LOW (ref 1.005–1.030)

## 2016-09-28 MED ORDER — KETOROLAC TROMETHAMINE 30 MG/ML IJ SOLN
60.0000 mg | Freq: Once | INTRAMUSCULAR | Status: AC
  Administered 2016-09-28: 60 mg via INTRAMUSCULAR
  Filled 2016-09-28: qty 2

## 2016-09-28 MED ORDER — CIPROFLOXACIN HCL 500 MG PO TABS: 500.0000 mg | ORAL_TABLET | Freq: Two times a day (BID) | ORAL | 0 refills | Status: AC

## 2016-09-28 MED ORDER — IBUPROFEN 600 MG PO TABS: 600.0000 mg | ORAL_TABLET | Freq: Three times a day (TID) | ORAL | 0 refills | Status: DC | PRN

## 2016-09-28 MED ORDER — OXYCODONE-ACETAMINOPHEN 5-325 MG PO TABS: 1.0000 | ORAL_TABLET | Freq: Four times a day (QID) | ORAL | 0 refills | Status: DC | PRN

## 2016-09-28 MED ORDER — CIPROFLOXACIN HCL 500 MG PO TABS
500.0000 mg | ORAL_TABLET | Freq: Once | ORAL | Status: AC
  Administered 2016-09-28: 500 mg via ORAL
  Filled 2016-09-28: qty 1

## 2016-09-28 NOTE — Discharge Instructions (Signed)
For your back pain, you may alternate Tylenol or Motrin. Please check with your primary care physician today her kidney function is normal in that you can continue the Motrin. Take the Motrin with food to prevent irritation of your stomach. You may take Percocet for severe pain; do not drive and please be careful not to fall when you're taking this medication. You may apply a heating pad to your left low back for 15 minutes every 2 hours.  Please take the entire course of antibiotics, even if you're feeling better.  Return to the emergency department if he develops severe pain, numbness tingling or weakness, fever, vomiting, or any other symptoms concerning to you.

## 2016-09-28 NOTE — ED Provider Notes (Addendum)
[facility] Emergency Department Provider Note  ____________________________________________  Time seen: Approximately 5:46 PM  I have reviewed the triage vital signs and the nursing notes.   HISTORY  Chief Complaint Flank Pain    HPI [name] is a 66 y.o. female presenting with 4 days of left lower back pain. The patient reports that since Sunday, she has had a dull ache in the left lower back which is worse with positional changes. The pain does not radiate. Since last night, the pain has been significant worse. She has taken Tylenol with mild relief. She is not had any dysuria, urinary frequency, hematuria. No trauma. No fevers or chills, numbness tingling or weakness, or difficulty walking. No urinary or fecal retention or incontinence.   Past Medical History:  Diagnosis Date  . Breast cancer ([place])    left breast mastectomy  . 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[place])   . Colon cancer ([place])   . Dysrhythmia   . Edema    FEET AND LEGS  . GERD (gastroesophageal reflux disease)   . Heart murmur   . History of hiatal hernia   . History of tachycardia   . Hypertension   . Iron deficiency anemia   . Morbid obesity ([place])   . 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 Osteoporosis, post-menopausal   . Shortness of breath dyspnea   . Type 2 diabetes mellitus without complication ([place])   . Valvular heart disease     There are no active problems to display for this patient.   Past Surgical History:  Procedure Laterality Date  . CATARACT EXTRACTION W/PHACO Right 01/26/2016   Procedure: CATARACT EXTRACTION PHACO AND INTRAOCULAR LENS PLACEMENT (IOC);  Surgeon: Birder Robson, MD;  Location: [facility] ORS;  Service: Ophthalmology;  Laterality: Right;  [place]  00:26 AP% 21.5 CDE 5.70 Fluid pack lot # [id number]  . COLON SURGERY     RESECTION  . COLONOSCOPY    . COLONOSCOPY WITH PROPOFOL N/A 09/07/2015   Procedure: COLONOSCOPY WITH PROPOFOL;  Surgeon:  Manya Silvas, MD;  Location: [facility] ENDOSCOPY;  Service: Endoscopy;  Laterality: N/A;  . ESOPHAGOGASTRODUODENOSCOPY    . EYE SURGERY    . INCISION AND DRAINAGE Bilateral    FOOT LESIONS  . KNEE ARTHROSCOPY Left   . LAPAROSCOPIC RIGHT COLECTOMY  2002  . MASTECTOMY Left 1992   Left mastectomy with right breast reduction for breast cancer  . OTHER SURGICAL HISTORY     Gastric Sleeve  . TONSILLECTOMY      Current Outpatient Rx  . Order #: [id number] Class: Historical Med  . Order #: [id number] Class: Historical Med  . Order #: [id number] Class: Historical Med  . Order #: [id number] Class: Historical Med  . Order #: [id number] Class: Print  . Order #: [id number] Class: Historical Med  . Order #: [id number] Class: Historical Med  . Order #: [id number] Class: Print  . Order #: [id number] Class: Historical Med  . Order #: [id number] Class: Historical Med  . Order #: [id number] Class: Historical Med  . Order #: [id number] Class: Historical Med  . Order #: [id number] Class: Historical Med  . Order #: [id number] Class: Historical Med  . Order #: [id number] Class: Print    Allergies Codeine phosphate [codeine] and Penicillins  History reviewed. No pertinent family history.  Social History Social History  Substance Use Topics  . Smoking status: Never Smoker  . Smokeless tobacco: Never Used  . Alcohol use No    Review of Systems Constitutional: No fever/chills.No lightheadedness or syncope. Eyes: No visual  changes. ENT: No sore throat. No congestion or rhinorrhea. Cardiovascular: Denies chest pain. Denies palpitations. Respiratory: Denies shortness of breath.  No cough. Gastrointestinal: No abdominal pain.  No nausea, no vomiting.  No diarrhea.  No constipation. Genitourinary: Negative for dysuria. Musculoskeletal: Positive for left lower back pain. Skin: Negative for rash. Neurological: Negative for headaches. No focal numbness, tingling or weakness. No urinary or fecal incontinence or retention. No  difficulty walking.  10-point ROS otherwise negative.  ____________________________________________   PHYSICAL EXAM:  VITAL SIGNS: ED Triage Vitals  Enc Vitals Group     BP 09/28/16 1553 (!) 148/98     Pulse Rate 09/28/16 1553 92     Resp 09/28/16 1553 18     Temp 09/28/16 1553 98 F (36.7 C)     Temp Source 09/28/16 1553 Oral     SpO2 09/28/16 1552 97 %     Weight 09/28/16 1553 290 lb (131.5 kg)     Height 09/28/16 1553 5\' 8"  (1.727 m)     Head Circumference --      Peak Flow --      Pain Score 09/28/16 1552 9     Pain Loc --      Pain Edu? --      Excl. in [place]? --     Constitutional: Alert and oriented. Chronically ill appearing and in no acute distress. Answers questions appropriately. Eyes: Conjunctivae are normal.  EOMI. No scleral icterus. Head: Atraumatic. Nose: No congestion/rhinnorhea. Mouth/Throat: Mucous membranes are moist.  Neck: No stridor.  Supple.  No JVD. No midline C-spine tenderness to palpation, step-offs or deformities. Cardiovascular: Normal rate, regular rhythm. No murmurs, rubs or gallops.  Respiratory: Normal respiratory effort.  No accessory muscle use or retractions. Lungs CTAB.  No wheezes, rales or ronchi. Gastrointestinal: Morbidly obese. Soft, nontender and nondistended.  No guarding or rebound.  No peritoneal signs. Musculoskeletal: No LE edema. No ttp in the calves or palpable cords.  Negative Homan's sign. No midline thoracic or lumbar spine tenderness to palpation. Reproducible left lower back pain with palpation and also with any significant movement especially lateral movement. Neurologic:  A&Ox3.  Speech is clear.  Face and smile are symmetric.  EOMI.  Moves all extremities well. Normal strength in the bilateral lower extremities. Skin:  Skin is warm, dry and intact. No rash noted. Psychiatric: Mood and affect are normal. Speech and behavior are normal.  Normal judgement.  ____________________________________________   LABS (all labs  ordered are listed, but only abnormal results are displayed)  Labs Reviewed  URINALYSIS, COMPLETE (UACMP) WITH MICROSCOPIC - Abnormal; Notable for the following:       Result Value   Color, Urine STRAW (*)    APPearance CLEAR (*)    Specific Gravity, Urine 1.004 (*)    Nitrite POSITIVE (*)    Bacteria, UA RARE (*)    Squamous Epithelial / LPF 0-5 (*)    All other components within normal limits  URINE CULTURE   ____________________________________________  EKG  Not indicated ____________________________________________  RADIOLOGY  No results found.  ____________________________________________   PROCEDURES  Procedure(s) performed: None  Procedures  Critical Care performed: No ____________________________________________   INITIAL IMPRESSION / ASSESSMENT AND PLAN / ED COURSE  Pertinent labs & imaging results that were available during my care of the patient were reviewed by me and considered in my medical decision making (see chart for details).  66 y.o. female with morbid obesity presenting with left lower back pain that is significantly worse  with positional changes. The patient has no neurologic deficits or findings on exam which would be indicative of his cauda equina syndrome or spinal cord stenosis. Her pain is reproducible with positional changes and may be musculoskeletal in nature. I will treat her with an NSAID, heat therapy, and Percocet as needed for severe pain for this. It is unlikely that the patient's pain is due to aortic pathology. She does have some rare bacteria with nitrite in her UA, sats of this for culture and will initiate oral antibiotics for UTI although it is less likely that this is the cause of her symptoms. At this time, the patient is stable for discharge. Return precautions as well as follow-up instructions were discussed.  ____________________________________________  FINAL CLINICAL IMPRESSION(S) / ED DIAGNOSES  Final diagnoses:   Acute left-sided low back pain without sciatica  Acute cystitis without hematuria         NEW MEDICATIONS STARTED DURING THIS VISIT:  New Prescriptions   CIPROFLOXACIN (CIPRO) 500 MG TABLET    Take 1 tablet (500 mg total) by mouth 2 (two) times daily.   IBUPROFEN (ADVIL,MOTRIN) 600 MG TABLET    Take 1 tablet (600 mg total) by mouth every 8 (eight) hours as needed for mild pain or moderate pain (with food).   OXYCODONE-ACETAMINOPHEN (ROXICET) 5-325 MG TABLET    Take 1-2 tablets by mouth every 6 (six) hours as needed.      Eula Listen, MD 09/28/16 1750    Eula Listen, MD 09/28/16 [phone]

## 2016-09-28 NOTE — ED Notes (Signed)
RN Nellie Reviewed d/c instructions, follow-up care and prescriptions with patient. Pt verbalized understanding. RN Nellie wheeled patient out.

## 2016-09-28 NOTE — ED Triage Notes (Signed)
Pt presents to ED via [facility] with c/o R sided flank pain that started last night. Pt is from [facility], denies any known injury, denies any urinary symptoms. Pt appears uncomfortable with some movements.

## 2016-09-28 NOTE — ED Notes (Signed)
Pt ambulatory to toilet with one assist.  

## 2016-09-28 NOTE — ED Notes (Signed)
NAD noted at this time. This RN apologized for and explained delay to patient. Pt states understanding. Will continue to monitor for further patient needs at this time. This RN turned on the TV for patient per request.

## 2016-10-01 LAB — URINE CULTURE: Culture: >=100000 — AB

## 2016-10-02 NOTE — Progress Notes (Signed)
ED Antimicrobial Stewardship Positive Culture Follow Up   [name] is an 66 y.o. female who presented to [facility] on 09/28/2016 with a chief complaint of  Chief Complaint  Patient presents with  . Flank Pain   Allergies  Allergen Reactions  . Codeine Phosphate [Codeine]     GI upset  . Penicillins     GI upset   Recent Results (from the past 720 hour(s))  Urine culture     Status: Abnormal   Collection Time: 09/28/16  4:30 PM  Result Value Ref Range Status   Specimen Description URINE, CLEAN CATCH  Final   Special Requests NONE  Final   Culture (A)  Final    >=100,000 COLONIES/mL ESCHERICHIA COLI Confirmed Extended Spectrum Beta-Lactamase Producer (ESBL) Performed at [facility], [address], [place], [place] [address]    Report Status 10/01/2016 FINAL  Final   Organism ID, Bacteria ESCHERICHIA COLI (A)  Final      Susceptibility   Escherichia coli - MIC*    AMPICILLIN >=32 RESISTANT Resistant     CEFAZOLIN >=64 RESISTANT Resistant     CEFTRIAXONE RESISTANT Resistant     CIPROFLOXACIN >=4 RESISTANT Resistant     GENTAMICIN <=1 SENSITIVE Sensitive     IMIPENEM <=0.25 SENSITIVE Sensitive     NITROFURANTOIN <=16 SENSITIVE Sensitive     TRIMETH/SULFA <=20 SENSITIVE Sensitive     AMPICILLIN/SULBACTAM 16 INTERMEDIATE Intermediate     PIP/TAZO <=4 SENSITIVE Sensitive     Extended ESBL POSITIVE Resistant     * >=100,000 COLONIES/mL ESCHERICHIA COLI    [x]  Treated with CIPRO, organism resistant to prescribed antimicrobial  Called patient to determine preferred pharmacy. [phone]  New antibiotic prescription: Macrobid (Nitrofurantoin) 100mg  po bid x 5 days called into [facility] on [address] in [place] [phone]. Patient stated she has appointment to see MD tomorrow (Monday) and would tell MD about culture results. Explained to patient if she does not fell better or gets worse in 2-3 days to see MD as may have to try a different antibiotic.  ED  Provider: Gates Rigg A 10/02/2016, 6:29 PM

## 2016-12-02 ENCOUNTER — Emergency Department: Payer: Medicare Other

## 2016-12-02 ENCOUNTER — Encounter: Payer: Self-pay | Admitting: Emergency Medicine

## 2016-12-02 ENCOUNTER — Emergency Department
Admission: EM | Admit: 2016-12-02 | Discharge: 2016-12-02 | Disposition: A | Discharge status: Home / Self Care | Payer: Medicare Other | Attending: Emergency Medicine | Admitting: Emergency Medicine

## 2016-12-02 DIAGNOSIS — Z7984 Long term (current) use of oral hypoglycemic drugs: Secondary | ICD-10-CM | POA: Diagnosis not present

## 2016-12-02 DIAGNOSIS — Z79899 Other long term (current) drug therapy: Secondary | ICD-10-CM | POA: Diagnosis not present

## 2016-12-02 DIAGNOSIS — Z853 Personal history of malignant neoplasm of breast: Secondary | ICD-10-CM | POA: Insufficient documentation

## 2016-12-02 DIAGNOSIS — E119 Type 2 diabetes mellitus without complications: Secondary | ICD-10-CM | POA: Insufficient documentation

## 2016-12-02 DIAGNOSIS — Z85038 Personal history of other malignant neoplasm of large intestine: Secondary | ICD-10-CM | POA: Diagnosis not present

## 2016-12-02 DIAGNOSIS — N938 Other specified abnormal uterine and vaginal bleeding: Secondary | ICD-10-CM | POA: Diagnosis not present

## 2016-12-02 DIAGNOSIS — I10 Essential (primary) hypertension: Secondary | ICD-10-CM | POA: Insufficient documentation

## 2016-12-02 DIAGNOSIS — N939 Abnormal uterine and vaginal bleeding, unspecified: Secondary | ICD-10-CM

## 2016-12-02 LAB — URINALYSIS, COMPLETE (UACMP) WITH MICROSCOPIC
Bilirubin Urine: NEGATIVE
Glucose, UA: NEGATIVE mg/dL
KETONES UR: NEGATIVE mg/dL
LEUKOCYTES UA: NEGATIVE
Nitrite: NEGATIVE
PH: 7 (ref 5.0–8.0)
Protein, ur: NEGATIVE mg/dL
SPECIFIC GRAVITY, URINE: 1.004 — AB (ref 1.005–1.030)

## 2016-12-02 LAB — COMPREHENSIVE METABOLIC PANEL
ALBUMIN: 3.8 g/dL (ref 3.5–5.0)
ALT: 16 U/L (ref 14–54)
AST: 25 U/L (ref 15–41)
Alkaline Phosphatase: 86 U/L (ref 38–126)
Anion gap: 7 (ref 5–15)
BUN: 21 mg/dL — AB (ref 6–20)
CHLORIDE: 100 mmol/L — AB (ref 101–111)
CO2: 29 mmol/L (ref 22–32)
CREATININE: 0.99 mg/dL (ref 0.44–1.00)
Calcium: 9.7 mg/dL (ref 8.9–10.3)
GFR calc Af Amer: >60 mL/min (ref >60)
GFR calc non Af Amer: 58 mL/min — ABNORMAL LOW (ref >60)
GLUCOSE: 129 mg/dL — AB (ref 65–99)
POTASSIUM: 4.5 mmol/L (ref 3.5–5.1)
Sodium: 136 mmol/L (ref 135–145)
Total Bilirubin: 0.5 mg/dL (ref 0.3–1.2)
Total Protein: 8.1 g/dL (ref 6.5–8.1)

## 2016-12-02 LAB — CBC
HEMATOCRIT: 32.9 % — AB (ref 35.0–47.0)
Hemoglobin: 10.7 g/dL — ABNORMAL LOW (ref 12.0–16.0)
MCH: 26.9 pg (ref 26.0–34.0)
MCHC: 32.6 g/dL (ref 32.0–36.0)
MCV: 82.5 fL (ref 80.0–100.0)
Platelets: 400 10*3/uL (ref 150–440)
RBC: 3.98 MIL/uL (ref 3.80–5.20)
RDW: 15.7 % — AB (ref 11.5–14.5)
WBC: 6.6 10*3/uL (ref 3.6–11.0)

## 2016-12-02 LAB — LIPASE, BLOOD: LIPASE: 22 U/L (ref 11–51)

## 2016-12-02 NOTE — ED Notes (Signed)
Patient went to Ultrasound.

## 2016-12-02 NOTE — ED Provider Notes (Signed)
[facility] Emergency Department Provider Note  Time seen: 6:06 PM  I have reviewed the triage vital signs and the nursing notes.   HISTORY  Chief Complaint Vaginal Bleeding    HPI [name] is a 66 y.o. female with a past medical history of gastric reflux, hypertension, obesity, diabetes, who presents to the emergency department for vaginal bleeding since 1 PM today. According to the patient she has not had vaginal bleeding since menopause approximately 15 years ago. She states around 1 PM today she noted vaginal bleeding which has remained fairly constant. She states she has gone through 2 pads since 1 PM. Denies any dizziness or lightheadedness. Denies any abdominal pain or cramping. Denies discharge. Denies any other bleeding.  Past Medical History:  Diagnosis Date  . Breast cancer ([place])    left breast mastectomy  . 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[place])   . Colon cancer ([place])   . Dysrhythmia   . Edema    FEET AND LEGS  . GERD (gastroesophageal reflux disease)   . Heart murmur   . History of hiatal hernia   . History of tachycardia   . Hypertension   . Iron deficiency anemia   . Morbid obesity ([place])   . 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 Osteoporosis, post-menopausal   . Shortness of breath dyspnea   . Type 2 diabetes mellitus without complication ([place])   . Valvular heart disease     There are no active problems to display for this patient.   Past Surgical History:  Procedure Laterality Date  . CATARACT EXTRACTION W/PHACO Right 01/26/2016   Procedure: CATARACT EXTRACTION PHACO AND INTRAOCULAR LENS PLACEMENT (IOC);  Surgeon: Birder Robson, MD;  Location: [facility] ORS;  Service: Ophthalmology;  Laterality: Right;  [place]  00:26 AP% 21.5 CDE 5.70 Fluid pack lot # [id number]  . COLON SURGERY     RESECTION  . COLONOSCOPY    . COLONOSCOPY WITH PROPOFOL N/A 09/07/2015   Procedure: COLONOSCOPY WITH PROPOFOL;  Surgeon: Manya Silvas,  MD;  Location: [facility] ENDOSCOPY;  Service: Endoscopy;  Laterality: N/A;  . ESOPHAGOGASTRODUODENOSCOPY    . EYE SURGERY    . INCISION AND DRAINAGE Bilateral    FOOT LESIONS  . KNEE ARTHROSCOPY Left   . LAPAROSCOPIC RIGHT COLECTOMY  2002  . MASTECTOMY Left 1992   Left mastectomy with right breast reduction for breast cancer  . OTHER SURGICAL HISTORY     Gastric Sleeve  . TONSILLECTOMY      Prior to Admission medications   Medication Sig Start Date End Date Taking? Authorizing Provider  amitriptyline (ELAVIL) 50 MG tablet Take 50 mg by mouth at bedtime.    [provider]  amLODipine (NORVASC) 5 MG tablet Take 5 mg by mouth daily.    [provider]  aspirin 81 MG tablet Take 81 mg by mouth daily.    [provider]  azelastine (ASTELIN) 0.1 % nasal spray Place 1 spray into both nostrils 2 (two) times daily. Use in each nostril as directed    [provider]  docusate sodium (COLACE) 100 MG capsule Take 200 mg by mouth daily.    [provider]  fluticasone (FLONASE) 50 MCG/ACT nasal spray Place 2 sprays into both nostrils daily.    [provider]  ibuprofen (ADVIL,MOTRIN) 600 MG tablet Take 1 tablet (600 mg total) by mouth every 8 (eight) hours as needed for mild pain or moderate pain (with food).  09/28/16   Eula Listen, MD  loratadine (CLARITIN) 10 MG tablet Take 10 mg by mouth daily.    [provider]  lovastatin (ALTOPREV) 40 MG 24 hr tablet Take 40 mg by mouth at bedtime.    [provider]  metFORMIN (GLUCOPHAGE) 500 MG tablet Take 500 mg by mouth 2 (two) times daily with a meal.    [provider]  metoprolol (LOPRESSOR) 100 MG tablet Take 200 mg by mouth 2 (two) times daily.    [provider]  omeprazole (PRILOSEC) 20 MG capsule Take 20 mg by mouth daily.    [provider]  oxybutynin (DITROPAN XL) 15 MG 24 hr tablet Take 15 mg by mouth at bedtime.    [provider]   oxyCODONE-acetaminophen (ROXICET) 5-325 MG tablet Take 1-2 tablets by mouth every 6 (six) hours as needed. 09/28/16 09/28/17  Eula Listen, MD    Allergies  Allergen Reactions  . Codeine Phosphate [Codeine]     GI upset  . Penicillins     GI upset    No family history on file.  Social History Social History  Substance Use Topics  . Smoking status: Never Smoker  . Smokeless tobacco: Never Used  . Alcohol use No    Review of Systems Constitutional: Negative for fever. Cardiovascular: Negative for chest pain. Respiratory: Negative for shortness of breath. Gastrointestinal: Negative for abdominal pain Genitourinary: Negative for dysuria. Positive for vaginal bleeding 5 hours. Musculoskeletal: Negative for back pain. Neurological: Negative for headache All other ROS negative  ____________________________________________   PHYSICAL EXAM:  VITAL SIGNS: ED Triage Vitals  Enc Vitals Group     BP 12/02/16 1624 (!) 198/89     Pulse Rate 12/02/16 1624 89     Resp 12/02/16 1624 18     Temp 12/02/16 1624 98.9 F (37.2 C)     Temp Source 12/02/16 1624 Oral     SpO2 12/02/16 1624 100 %     Weight 12/02/16 1625 300 lb (136.1 kg)     Height 12/02/16 1625 5\' 7"  (1.702 m)     Head Circumference --      Peak Flow --      Pain Score 12/02/16 1623 8     Pain Loc --      Pain Edu? --      Excl. in [place]? --     Constitutional: Alert and oriented. Well appearing and in no distress. Eyes: Normal exam ENT   Head: Normocephalic and atraumatic   Mouth/Throat: Mucous membranes are moist. Cardiovascular: Normal rate, regular rhythm. No murmur Respiratory: Normal respiratory effort without tachypnea nor retractions. Breath sounds are clear  Gastrointestinal: Soft and nontender. No distention.  Obese. Musculoskeletal: Nontender with normal range of motion in all extremities.  Neurologic:  Normal speech and language. No gross focal neurologic deficits Skin:  Skin is warm,  dry and intact.  Psychiatric: Mood and affect are normal.  ____________________________________________   INITIAL IMPRESSION / ASSESSMENT AND PLAN / ED COURSE  Pertinent labs & imaging results that were available during my care of the patient were reviewed by me and considered in my medical decision making (see chart for details).  Patient presents to the emergency department with vaginal bleeding since 1 PM. Labs are largely at baseline, with a hemoglobin of 10.7. Normal physical exam, nontender abdomen denies any abdominal pain. We'll perform a pelvic examination to visualize the cervix, and obtain an ultrasound and discuss with GYN.  Pelvic exam shows mild  to moderate amount of vaginal bleeding. No obvious cervical mass, although exam is somewhat limited due to body habitus. We will send for an ultrasound and continue to closely monitor.  Ultrasound shows thickened endometrium to 15 mm. I discussed the patient with Dr. Marcelline Mates of the OB/GYN who states she will have the patient follow-up in the office. Patient will be discharged at this time. ____________________________________________   FINAL CLINICAL IMPRESSION(S) / ED DIAGNOSES  Vaginal bleeding    Harvest Dark, MD 12/02/16 2118

## 2016-12-02 NOTE — ED Triage Notes (Signed)
Pt in via POV with complaints vaginal bleeding and pelvic pain since approximately 1300 today, saturating 1 pad/hour.  Pt hemodynamically stable, NAD noted at this time.

## 2016-12-02 NOTE — ED Notes (Signed)
Patient returned from Ultrasound. 

## 2016-12-02 NOTE — Discharge Instructions (Signed)
Please call the number provided for Dr. Marcelline Mates Monday morning to arrange a follow-up appointment as soon as possible. Return to the emergency department for any significant increase in bleeding, or any other symptom personally concerning to yourself.

## 2016-12-02 NOTE — ED Notes (Signed)
Vaginal bleeding that began at approx 1300 today. No hx of such X 20+ year per patient. Denies pain at this time.

## 2016-12-02 NOTE — ED Notes (Signed)

## 2016-12-07 ENCOUNTER — Ambulatory Visit (INDEPENDENT_AMBULATORY_CARE_PROVIDER_SITE_OTHER): Payer: Medicare Other | Admitting: Obstetrics and Gynecology

## 2016-12-07 ENCOUNTER — Encounter: Payer: Self-pay | Admitting: Obstetrics and Gynecology

## 2016-12-07 VITALS — BP 160/82 | HR 102 | Ht 67.0 in | Wt 300.0 lb

## 2016-12-07 DIAGNOSIS — Z853 Personal history of malignant neoplasm of breast: Secondary | ICD-10-CM

## 2016-12-07 DIAGNOSIS — Z85038 Personal history of other malignant neoplasm of large intestine: Secondary | ICD-10-CM | POA: Diagnosis not present

## 2016-12-07 DIAGNOSIS — I10 Essential (primary) hypertension: Secondary | ICD-10-CM

## 2016-12-07 DIAGNOSIS — D62 Acute posthemorrhagic anemia: Secondary | ICD-10-CM | POA: Diagnosis not present

## 2016-12-07 DIAGNOSIS — N950 Postmenopausal bleeding: Secondary | ICD-10-CM | POA: Diagnosis not present

## 2016-12-07 DIAGNOSIS — Z124 Encounter for screening for malignant neoplasm of cervix: Secondary | ICD-10-CM

## 2016-12-07 DIAGNOSIS — D259 Leiomyoma of uterus, unspecified: Secondary | ICD-10-CM

## 2016-12-07 DIAGNOSIS — E11621 Type 2 diabetes mellitus with foot ulcer: Secondary | ICD-10-CM | POA: Diagnosis not present

## 2016-12-07 DIAGNOSIS — L97509 Non-pressure chronic ulcer of other part of unspecified foot with unspecified severity: Secondary | ICD-10-CM | POA: Diagnosis not present

## 2016-12-07 MED ORDER — MEDROXYPROGESTERONE ACETATE 10 MG PO TABS: 10.0000 mg | ORAL_TABLET | Freq: Every day | ORAL | 2 refills | Status: DC

## 2016-12-07 NOTE — Patient Instructions (Addendum)

## 2016-12-07 NOTE — Progress Notes (Signed)
GYNECOLOGY CLINIC PROGRESS NOTE  Subjective:    [name] is a 66 y.o. [id number] post-menopausal female who presents for concerns regarding vaginal bleeding. She has been menopausal for 15 years. Has never been on HRT. Bleeding is described as flow about like a period and has occurred several times over the past 5 days.  Patient was seen in the Emergency Room on 12/02/2016 for her bleeding. Other menopausal symptoms include: none. Workup to date: CBC and ultrasound.  Menstrual History: No LMP recorded. Patient is postmenopausal.  Last mammogram: 11/05/2015. Results were normal.  Last colonoscopy: 08/2015.  Results were: adenomatous and hyperplastic polyps.  No malignancy. Internal hemorrhoids. Repeat in 3-5 years.   OB History  Gravida Para Term Preterm AB Living  3 1 1   2 1   SAB TAB Ectopic Multiple Live Births  2       1    # Outcome Date GA Lbr Len/2nd Weight Sex Delivery Anes PTL Lv  3 Term 51    M    LIV  2 SAB           1 SAB               Past Medical History:  Diagnosis Date  . Breast cancer ([place])    left breast mastectomy  . Breast cancer ([place])   . 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[place])   . Colon cancer ([place])   . Dysrhythmia   . Edema    FEET AND LEGS  . GERD (gastroesophageal reflux disease)   . Heart murmur   . History of hiatal hernia   . History of tachycardia   . Hypertension   . Iron deficiency anemia   . Morbid obesity ([place])   . 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 Osteoporosis, post-menopausal   . Shortness of breath dyspnea   . Type 2 diabetes mellitus without complication ([place])   . Valvular heart disease     History reviewed. No pertinent family history. Notes parents were deceased at an early age, never was able to obtain history.     Social History   Social History  . Marital status: Legally Separated    Spouse name: N/A  . Number of children: N/A  . Years of education: N/A   Occupational History  . Not on file.    Social History Main Topics  . Smoking status: Never Smoker  . Smokeless tobacco: Never Used  . Alcohol use No  . Drug use: No  . Sexual activity: Not Currently    Birth control/ protection: None   Other Topics Concern  . Not on file   Social History Narrative  . No narrative on file    Current Outpatient Prescriptions on File Prior to Visit  Medication Sig Dispense Refill  . amitriptyline (ELAVIL) 50 MG tablet Take 50 mg by mouth at bedtime.    Marland Kitchen amLODipine (NORVASC) 5 MG tablet Take 5 mg by mouth daily.    Marland Kitchen aspirin 81 MG tablet Take 81 mg by mouth daily.    Marland Kitchen azelastine (ASTELIN) 0.1 % nasal spray Place 1 spray into both nostrils 2 (two) times daily. Use in each nostril as directed    . docusate sodium (COLACE) 100 MG capsule Take 200 mg by mouth daily.    . fluticasone (FLONASE) 50 MCG/ACT nasal spray Place 2 sprays into both nostrils daily.    Marland Kitchen ibuprofen (ADVIL,MOTRIN) 600 MG tablet Take 1 tablet (600  mg total) by mouth every 8 (eight) hours as needed for mild pain or moderate pain (with food). 20 tablet 0  . loratadine (CLARITIN) 10 MG tablet Take 10 mg by mouth daily.    Marland Kitchen lovastatin (ALTOPREV) 40 MG 24 hr tablet Take 40 mg by mouth at bedtime.    . metFORMIN (GLUCOPHAGE) 500 MG tablet Take 500 mg by mouth 2 (two) times daily with a meal.    . metoprolol (LOPRESSOR) 100 MG tablet Take 200 mg by mouth 2 (two) times daily.    Marland Kitchen omeprazole (PRILOSEC) 20 MG capsule Take 20 mg by mouth daily.    Marland Kitchen oxybutynin (DITROPAN XL) 15 MG 24 hr tablet Take 15 mg by mouth at bedtime.    Marland Kitchen oxyCODONE-acetaminophen (ROXICET) 5-325 MG tablet Take 1-2 tablets by mouth every 6 (six) hours as needed. 12 tablet 0   No current facility-administered medications on file prior to visit.     Allergies  Allergen Reactions  . Codeine Phosphate [Codeine]     GI upset  . Penicillins     GI upset     Review of Systems Pertinent items noted in HPI and remainder of comprehensive ROS otherwise  negative.    Objective:    BP (!) 160/82 (BP Location: Other (Comment), Patient Position: Sitting, Cuff Size: Normal)   Pulse (!) 102   Ht 5\' 7"  (1.702 m)   Wt 300 lb (136.1 kg)   BMI 46.99 kg/m  General appearance: alert and no distress.  Wheelchair bound.  Morbidly obese.  Neck: No masses or thyromegaly or limitation in range of motion Abdomen: normal findings: bowel sounds normal and soft, non-tender and abnormal findings:  umbilical hernia  Pelvis: external genitalia normal, rectovaginal septum normal.  Vagina with scant dark red blood in vaginal vault. No vaginal discharge.  Cervix normal appearing, no lesions and no motion tenderness.  Uterus and adnexae unable to be palpated due to body habitus.  No tenderness of adnexal regions.  Extremities: extremities normal, atraumatic, no cyanosis or edema and orthopedic boot on right foot due to h/o heel amputation Neurologic: Mental status: Alert, oriented, thought content appropriate     Labs:  Lab Results  Component Value Date   WBC 6.6 12/02/2016   HGB 10.7 (L) 12/02/2016   HCT 32.9 (L) 12/02/2016   MCV 82.5 12/02/2016   PLT 400 12/02/2016    Lab Results  Component Value Date   TSH 0.948 08/19/2014     Imaging:  CLINICAL DATA:  Mid pelvic pain and postmenopausal vaginal bleeding for 6 hours  EXAM: TRANSABDOMINAL AND TRANSVAGINAL ULTRASOUND OF PELVIS  TECHNIQUE: Both transabdominal and transvaginal ultrasound examinations of the pelvis were performed. Transabdominal technique was performed for global imaging of the pelvis including uterus, ovaries, adnexal regions, and pelvic cul-de-sac. It was necessary to proceed with endovaginal exam following the transabdominal exam to visualize the LEFT ovary and endometrium.  COMPARISON:  None  FINDINGS: Uterus  Measurements: 7.6 x 3.1 x 4.1 cm. Retroverted. Mildly hyperechoic nodule at LEFT fundus 1.6 x 2.1 x 1.8 cm without shadowing question leiomyoma or  lipoleiomyoma. Additional tiny potential leiomyomata 10 x 10 x 12 mm at LEFT uterus and 9 x 9 x 10 mm at fundus.  Endometrium  Thickness: Abnormally thickened for a postmenopausal patient at 15 mm thick. Small amount of endometrial fluid.  Right ovary  Measurements: 3.0 x 1.7 x 1.4 cm. Normal morphology without mass  Left ovary  Not visualized on either transabdominal or endovaginal  imaging question obscured by bowel gas  Other findings  Trace free pelvic fluid.  No adnexal masses.  IMPRESSION: Probable high uterine leiomyomas as above, largest potentially containing fat.  Abnormal thickening of the endometrial complex for a postmenopausal patient at 15 mm thick with a small amount of endometrial fluid.  In the setting of post-menopausal bleeding, endometrial sampling is indicated to exclude carcinoma. If results are benign, sonohysterogram should be considered for focal lesion work-up. (Ref: Radiological Reasoning: Algorithmic Workup of Abnormal Vaginal Bleeding with Endovaginal Sonography and Sonohysterography. AJR 2008; 409:W11-91)  Assessment:   Postmenopausal bleeding   Cervical cancer screening Morbid obesity H/o DM with complications H/o HTN H/o cancer (breast and colon) Anemia, mild Fibroid uterus   Plan:   - Discussed etiologies of postmenopausal bleeding, concern about precancerous/hyperplasia or cancerous etiology (5 to 10% percent of cases). Also discussed role of unopposed estrogen exposure in leading to thickened or proliferative endometrium; and its possible correlation with endometrial hyperplasia/carcinoma.  Discussed that obesity is linked to endometrial pathology given that adipose cells produce extra estrogen (estrone) which can cause the endometrium to have a significant amount of estrogen exposure.  However, she was reassured that endometrial atrophy and endometrial polyps are the most common causes of postmenopausal bleeding.   Uterine bleeding in postmenopausal women is usually light and self-limited. Exclusion of cancer is the main objective; therefore, treatment is usually unnecessary once cancer has been excluded.  The primary goal in the diagnostic evaluation of postmenopausal women with uterine bleeding is to exclude malignancy; this will include an endometrial biopsy (performed today, see procedure note below).    - Patient notes that she has not had a pap smear in over 20-30 years.  Will perform today to screen for cervical cancer. No abnormal lesions noted today.  - H/o DM with vascular complications. 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 Currently being managed by PCP.  - H/o HTN. Elevated BP noted today. Notes her BP is usually ok at PCP office. Reports compliance with meds.  - H/o cancer (breast and colon).  Is due for next mammogram. Up to date on colonoscopy.  - Anemia, mild. Likely due to recent episode of bleeding, however review of patient's chart notes episodes of anemia even back to 2016. Notes that bleeding has slowed down.  Advised that if bleeding increases again before next appointment, will prescribe a course of Provera to manage.  - Small possible fibroids vs fatty tissue noted on uterus. Not likely cause of vaginal bleeding.  - Follow up in 2 weeks to discuss biopsy results.     Endometrial Biopsy Procedure Note  The patient is positioned on the exam table in the dorsal lithotomy position. Bimanual exam confirms uterine position and size. A Graves speculum is placed into the vagina. A single toothed tenaculum is placed onto the anterior lip of the cervix. The pipette is placed into the endocervical canal and is advanced to the uterine fundus. Using a piston like technique, with vacuum created by withdrawing the stylus, the endometrial specimen is obtained and transferred to the biopsy container. Minimal bleeding is encountered. The procedure is well tolerated.   Uterine  Position: unable to palpate due to    Uterine Length: 7 cm   Uterine Specimen: Average   Post procedure instructions are given. The patient is scheduled for follow up appointment.    Rubie Maid, MD [facility]

## 2016-12-12 ENCOUNTER — Telehealth: Payer: Self-pay

## 2016-12-12 LAB — PAP IG AND HPV HIGH-RISK
HPV, high-risk: NEGATIVE
PAP Smear Comment: 0

## 2016-12-12 LAB — PATHOLOGY

## 2016-12-14 NOTE — Telephone Encounter (Signed)
See emb lab.

## 2016-12-21 ENCOUNTER — Ambulatory Visit (INDEPENDENT_AMBULATORY_CARE_PROVIDER_SITE_OTHER): Payer: Medicare Other | Admitting: Obstetrics and Gynecology

## 2016-12-21 ENCOUNTER — Encounter: Payer: Self-pay | Admitting: Obstetrics and Gynecology

## 2016-12-21 VITALS — BP 147/82 | HR 88 | Ht 67.0 in | Wt 300.0 lb

## 2016-12-21 DIAGNOSIS — C55 Malignant neoplasm of uterus, part unspecified: Secondary | ICD-10-CM | POA: Diagnosis not present

## 2016-12-21 DIAGNOSIS — Z85038 Personal history of other malignant neoplasm of large intestine: Secondary | ICD-10-CM | POA: Diagnosis not present

## 2016-12-21 DIAGNOSIS — N950 Postmenopausal bleeding: Secondary | ICD-10-CM

## 2016-12-21 DIAGNOSIS — L97509 Non-pressure chronic ulcer of other part of unspecified foot with unspecified severity: Secondary | ICD-10-CM | POA: Diagnosis not present

## 2016-12-21 DIAGNOSIS — Z853 Personal history of malignant neoplasm of breast: Secondary | ICD-10-CM

## 2016-12-21 DIAGNOSIS — E11621 Type 2 diabetes mellitus with foot ulcer: Secondary | ICD-10-CM

## 2016-12-21 NOTE — Patient Instructions (Signed)
Uterine Cancer Uterine cancer is an abnormal growth of cancer tissue (malignant tumor) in the uterus. Unlike noncancerous (benign) tumors, malignant tumors can spread to other parts of the body. Uterine cancer usually occurs after menopause. However, it may also occur around the time that menopause begins. The wall of the uterus has an inner layer of tissue (endometrium) and an outer layer of muscle tissue (myometrium). The most common type of uterine cancer begins in the endometrium (endometrial cancer). Cancer that begins in the myometrium (uterine sarcoma) is very rare. What are the causes? The exact cause of this condition is not known. What increases the risk? You are more likely to develop this condition if you:  Are older than 50.  Have an enlarged endometrium (endometrial hyperplasia).  Use hormone therapy.  Are severely overweight (obese).  Use the medicine tamoxifen.  You are white (Caucasian).  Cannot bear children (are infertile).  Have never been pregnant.  Started menstruating at an age younger than 12 years.  Are older than 52 and are still having menstrual periods.  Have a history of cancer of the ovaries, intestines, or colon or rectum (colorectal cancer).  Have a history of enlarged ovaries with small cysts (polycystic ovarian syndrome).  Have a family history of: ? Uterine cancer. ? Hereditary nonpolyposis colon cancer (HNPCC).  Have diabetes, high blood pressure, thyroid disease, or gallbladder disease.  Use long-term, high-dose birth control pills.  Have been exposed to radiation.  Smoke.  What are the signs or symptoms? Symptoms of this condition include:  Abnormal vaginal bleeding or discharge. Bleeding may start as a watery, blood-streaked flow that gradually contains more blood. This is the most common symptom. If you experience abnormal vaginal bleeding, do not assume that it is part of menopause.  Vaginal bleeding after  menopause.  Unexplained weight loss.  Bleeding between periods.  Urination that is difficult, painful, or more frequent than usual.  A lump (mass) in the vagina.  Pain, bloating, or fullness in the abdomen.  Pain in the pelvic area.  Pain during sex.  How is this diagnosed? This condition may be diagnosed based on:  Your medical history and your symptoms.  A physical and pelvic exam. Your health care provider will feel your pelvis for any growths or enlarged lymph nodes.  Blood and urine tests.  Imaging tests, such as X-rays, CT scans, ultrasound, or MRIs.  A procedure in which a thin, flexible tube with a light and camera on the end is inserted through the vagina and used to look inside the uterus (hysteroscopy).  A Pap test to check for abnormal cells in the lower part of the uterus (cervix) and the upper vagina.  Removing a tissue sample (biopsy) from the uterine lining to check for cancer cells.  Dilation and curettage (D&C). This is a procedure that involves stretching (dilation) the cervix and scraping (curettage) the inside lining of the uterus to get a biopsy and check for cancer cells.  Your cancer will be staged to determine its severity and extent. Staging is an assessment of:  The size of the tumor.  Whether the cancer has spread.  Where the cancer has spread.  The stages of uterine cancer are as follows:  Stage I. The cancer is only found in the uterus.  Stage II. The cancer has spread to the cervix.  Stage III. The cancer has spread outside the uterus, but not outside the pelvis. The cancer may have spread to the lymph nodes in the pelvis.  Lymph nodes are part of your body's disease-fighting (immune) system. Lymph nodes are found in many locations in your body, including the neck, underarm, and groin.  Stage IV. The cancer has spread to other parts of the body, such as the bladder or rectum.  How is this treated? This condition is often treated with  surgery to remove:  The uterus, cervix, fallopian tubes, and ovaries (total hysterectomy).  The uterus and cervix (simple hysterectomy).  The type of hysterectomy you will have depends on the extent of your cancer. Lymph nodes near the uterus may also be removed in some cases. Treatment may also include one or more of the following:  Chemotherapy. This uses medicines to kill the cancer cells and prevent their spread.  Radiation therapy. This uses high-energy rays to kill the cancer cells and prevent the spread of cancer.  Chemoradiation. This is a combination treatment that alternates chemotherapy with radiation treatments to enhance the way radiation works.  Brachytherapy. This involves placing radioactive materials inside the body where the cancer was removed.  Hormone therapy. This includes taking medicines that lower the levels of estrogen in the body.  Follow these instructions at home: Activity  Return to your normal activities as told by your health care provider. Ask your health care provider what activities are safe for you.  Exercise regularly as told by your health care provider.  Do not drive or use heavy machinery while taking prescription pain medicine. General instructions  Take over-the-counter and prescription medicines only as told by your health care provider.  Maintain a healthy diet.  Work with your health care provider to: ? Manage any long-term (chronic) conditions you have, such as diabetes, high blood pressure, thyroid disease, or gallbladder disease. ? Manage any side effects of your treatment.  Do not use any products that contain nicotine or tobacco, such as cigarettes and e-cigarettes. If you need help quitting, ask your health care provider.  Consider joining a support group to help you cope with stress. Your health care provider may be able to recommend a local or online support group.  Keep all follow-up visits as told by your health care  provider. This is important. Where to find more information:  American Cancer Society: https://www.cancer.Miguel Barrera ([place]): https://www.cancer.gov Contact a health care provider if:  You have pain in your pelvis or abdomen that gets worse.  You cannot urinate.  You have abnormal bleeding.  You have a fever. Get help right away if:  You develop sudden or new severe symptoms, such as: ? Heavy bleeding. ? Severe weakness. ? Pain that is severe or does not get better with medicine. Summary  Uterine cancer is an abnormal growth of cancer tissue (malignant tumor) in the uterus. The most common type of uterine cancer begins in the endometrium (endometrial cancer).  This condition is often treated with surgery to remove the uterus, cervix, fallopian tubes, and ovaries (total hysterectomy) or the uterus and cervix (simple hysterectomy).  Work with your health care provider to manage any long-term (chronic) conditions you have, such as diabetes, high blood pressure, thyroid disease, or gallbladder disease.  Consider joining a support group to help you cope with stress. Your health care provider may be able to recommend a local or online support group. This information is not intended to replace advice given to you by your health care provider. Make sure you discuss any questions you have with your health care provider. Document Released: 05/09/2005 Document Revised: 05/06/2016 Document  Reviewed: 05/06/2016 Elsevier Interactive Patient Education  2017 [employer]vier Inc.  

## 2016-12-21 NOTE — Progress Notes (Signed)
    GYNECOLOGY PROGRESS NOTE  Subjective:    Patient ID: [name], female    DOB: 04-12-1951, 66 y.o.   MRN: [id number]  HPI  Patient is a 66 y.o. [id number] morbidly obese postmenopausal female with a past history of breast, colon cancer, and TYpe II DM who presents for follow up of endometrial biopsy for postmenopausal bleeding. Patient denies complaints today, notes that she has not had any further bleeding since her episode which sent her to the ER in July.    The following portions of the patient's history were reviewed and updated as appropriate: allergies, current medications, past family history, past medical history, past social history, past surgical history and problem list.  Review of Systems Pertinent items noted in HPI and remainder of comprehensive ROS otherwise negative.   Objective:   Blood pressure (!) 147/82, pulse 88, height 5\' 7"  (1.702 m), weight 300 lb (136.1 kg). General appearance: alert Exam deferred today   Cytology:  12/07/2016: Pap smear negative  Pathology (12/07/2016):  ENDOMETRIUM, BIOPSY:  SMALL FRAGMENTS OF ATYPICAL GLANDS, SUSPICIOUS FOR MUCINOUS  ADENOCARCOINOMA OF THE ENDOMETRIUM, MIXED WITH DEGENERATED TISSUE, AND  FEW BENIGN ENDOMETRIAL GLANDS.I  COMMENT: THE BIOPSY IS SCANT, RECOMMEND ADDITIONAL BIOPSY FOR BETTER  EVALUATION. THIS CASE WAS REVIEWED IN INTRADEPARTMENTAL CONSULTATION AND  THE DIAGNOSIS REFLECTS OUR CONSENSUS OPINION.    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 All questions answered.  Discussed management options with patient, including repeating the endometrial biopsy in office to obtain more tissue as she tolerated the last procedure well, undergoing a Hysteroscopy D&C to  ensure adequate tissue sampling (which would require anesthesia), and referral to a GYN Oncologist to discuss possibility of proceeding with further management options (usually hysterectomy, based on current results with or without repeat sampling).  Patient notes that based on her history of previous cancers, she would like to go ahead and have the referral as she would like to proceed with a hysterectomy. Will place referral.   - Patient up to date on colonoscopy (performed last year 08/2015, with internal hemorrhoids and benign polyps noted), but is due for a mammogram. Last mammogram normal 10/2015.  Will place order.    A total of 15 minutes were spent face-to-face with the patient during this encounter and over half of that time dealt with counseling and coordination of care.   Rubie Maid, MD [facility]

## 2016-12-24 DIAGNOSIS — M2550 Pain in unspecified joint: Secondary | ICD-10-CM | POA: Insufficient documentation

## 2016-12-24 DIAGNOSIS — E11621 Type 2 diabetes mellitus with foot ulcer: Secondary | ICD-10-CM | POA: Insufficient documentation

## 2016-12-24 DIAGNOSIS — M869 Osteomyelitis, unspecified: Secondary | ICD-10-CM | POA: Insufficient documentation

## 2016-12-24 DIAGNOSIS — R9431 Abnormal electrocardiogram [ECG] [EKG]: Secondary | ICD-10-CM | POA: Insufficient documentation

## 2016-12-24 DIAGNOSIS — L97509 Non-pressure chronic ulcer of other part of unspecified foot with unspecified severity: Secondary | ICD-10-CM

## 2016-12-24 DIAGNOSIS — K432 Incisional hernia without obstruction or gangrene: Secondary | ICD-10-CM | POA: Insufficient documentation

## 2016-12-24 DIAGNOSIS — E7800 Pure hypercholesterolemia, unspecified: Secondary | ICD-10-CM | POA: Insufficient documentation

## 2016-12-24 DIAGNOSIS — E11610 Type 2 diabetes mellitus with diabetic neuropathic arthropathy: Secondary | ICD-10-CM | POA: Insufficient documentation

## 2017-01-04 ENCOUNTER — Encounter: Payer: Self-pay | Admitting: Obstetrics and Gynecology

## 2017-01-04 ENCOUNTER — Inpatient Hospital Stay: Payer: Medicare Other | Attending: Obstetrics and Gynecology | Admitting: Obstetrics and Gynecology

## 2017-01-04 VITALS — BP 142/83 | HR 89 | Temp 99.2°F | Resp 18 | Ht 67.0 in | Wt 318.9 lb

## 2017-01-04 DIAGNOSIS — C549 Malignant neoplasm of corpus uteri, unspecified: Secondary | ICD-10-CM | POA: Diagnosis present

## 2017-01-04 DIAGNOSIS — Z7982 Long term (current) use of aspirin: Secondary | ICD-10-CM | POA: Insufficient documentation

## 2017-01-04 DIAGNOSIS — Z853 Personal history of malignant neoplasm of breast: Secondary | ICD-10-CM | POA: Insufficient documentation

## 2017-01-04 DIAGNOSIS — Z89511 Acquired absence of right leg below knee: Secondary | ICD-10-CM | POA: Diagnosis not present

## 2017-01-04 DIAGNOSIS — Z7984 Long term (current) use of oral hypoglycemic drugs: Secondary | ICD-10-CM | POA: Diagnosis not present

## 2017-01-04 DIAGNOSIS — E1169 Type 2 diabetes mellitus with other specified complication: Secondary | ICD-10-CM | POA: Insufficient documentation

## 2017-01-04 DIAGNOSIS — E782 Mixed hyperlipidemia: Secondary | ICD-10-CM | POA: Insufficient documentation

## 2017-01-04 DIAGNOSIS — Z6841 Body Mass Index (BMI) 40.0 and over, adult: Secondary | ICD-10-CM | POA: Diagnosis not present

## 2017-01-04 DIAGNOSIS — Z79899 Other long term (current) drug therapy: Secondary | ICD-10-CM | POA: Insufficient documentation

## 2017-01-04 DIAGNOSIS — K219 Gastro-esophageal reflux disease without esophagitis: Secondary | ICD-10-CM

## 2017-01-04 DIAGNOSIS — M869 Osteomyelitis, unspecified: Secondary | ICD-10-CM

## 2017-01-04 DIAGNOSIS — I10 Essential (primary) hypertension: Secondary | ICD-10-CM | POA: Diagnosis not present

## 2017-01-04 DIAGNOSIS — K432 Incisional hernia without obstruction or gangrene: Secondary | ICD-10-CM | POA: Diagnosis not present

## 2017-01-04 DIAGNOSIS — E11621 Type 2 diabetes mellitus with foot ulcer: Secondary | ICD-10-CM | POA: Diagnosis not present

## 2017-01-04 NOTE — Progress Notes (Signed)
  Oncology Nurse Navigator Documentation Chaperoned pelvic exam.  Navigator Location: [facility] (01/04/17 1400)   )Navigator Encounter Type: Initial GynOnc (01/04/17 1400)                     Patient Visit Type: GynOnc (01/04/17 1400)                              Time Spent with Patient: 30 (01/04/17 1400)

## 2017-01-04 NOTE — Progress Notes (Addendum)
Gynecologic Oncology Consult Visit   Referring Provider: Dr Marcelline Mates  Chief Concern: possible uterine cancer  Subjective:  [name] is a 66 y.o. female [id number] who is seen in consultation from Dr. Marcelline Mates for possible uterine cancer.  Patient presented with a few days of postmenopausal bleeding and endometrial biopsy with Dr Marcelline Mates showed suspicion for mucinous cancer (see below).   PAP smear normal.  Not bleeding now.  History of breast cancer about 25 years ago and colon cancer about 7 years ago treated by colectomy, and morbid obesity and Type II DM.  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   COMMENT: THE BIOPSY IS SCANT, RECOMMEND ADDITIONAL BIOPSY FOR BETTER  EVALUATION. THIS CASE WAS REVIEWED IN INTRADEPARTMENTAL CONSULTATION AND  THE DIAGNOSIS REFLECTS OUR CONSENSUS OPINION.   - Patient up to date on colonoscopy (performed last year 08/2015, with internal hemorrhoids and benign polyps noted), but is due for a mammogram. Last mammogram normal 10/2015.    Problem List: Patient Active Problem List   Diagnosis Date Noted  . 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[place]) 12/24/2016  . Diabetic foot ulcer ([place]) 12/24/2016  . Electrocardiogram abnormal 12/24/2016  . Hypercholesterolemia 12/24/2016  . Hypomagnesemia 12/24/2016  . Joint pain 12/24/2016  . Morbid obesity ([place]) 12/24/2016  . 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 Ventral incisional hernia 12/24/2016  . Diabetes mellitus ([place]) 11/27/2013  . Essential hypertension 11/27/2013  . Mixed hyperlipidemia 11/27/2013    Past Medical History: Past Medical History:  Diagnosis Date  . Breast cancer ([place])    left breast mastectomy  . Breast  cancer ([place])   . 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[place])   . Colon cancer ([place])   . Dysrhythmia   . Edema    FEET AND LEGS  . GERD (gastroesophageal reflux disease)   . Heart murmur   . History of hiatal hernia   . History of tachycardia   . Hypertension   . Iron deficiency anemia   . Morbid obesity ([place])   . 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 Osteoporosis, post-menopausal   . Shortness of breath dyspnea   . Type 2 diabetes mellitus without complication ([place])   . Valvular heart disease     Past Surgical History: Past Surgical History:  Procedure Laterality Date  . CATARACT EXTRACTION W/PHACO Right 01/26/2016   Procedure: CATARACT EXTRACTION PHACO AND INTRAOCULAR LENS PLACEMENT (IOC);  Surgeon: Birder Robson, MD;  Location: [facility] ORS;  Service: Ophthalmology;  Laterality: Right;  [place]  00:26 AP% 21.5 CDE 5.70 Fluid pack lot # [id number]  . COLON SURGERY     RESECTION  . COLONOSCOPY    . COLONOSCOPY WITH PROPOFOL N/A 09/07/2015   Procedure: COLONOSCOPY WITH PROPOFOL;  Surgeon: Manya Silvas, MD;  [facility] ENDOSCOPY;  Service: Endoscopy;  Laterality: N/A;  . DILATION AND CURETTAGE OF UTERUS    . ESOPHAGOGASTRODUODENOSCOPY    . EYE SURGERY    . INCISION AND DRAINAGE Bilateral    FOOT LESIONS  . KNEE ARTHROSCOPY Left   . LAPAROSCOPIC RIGHT COLECTOMY  2002  . MASTECTOMY Left 1992   Left mastectomy with right breast reduction for breast cancer  .  OTHER SURGICAL HISTORY     Gastric Sleeve  . TONSILLECTOMY       OB History:  OB History  Gravida Para Term Preterm AB Living  3 1 1   2 1   SAB TAB Ectopic Multiple Live Births  2       1    # Outcome Date GA Lbr Len/2nd Weight Sex Delivery Anes PTL Lv  3 Term 30    M    LIV  2 SAB           1 SAB               Family History: History reviewed. No pertinent family history.  Social History: Social History   Social History  . Marital status: Legally  Separated    Spouse name: N/A  . Number of children: N/A  . Years of education: N/A   Occupational History  . Not on file.   Social History Main Topics  . Smoking status: Never Smoker  . Smokeless tobacco: Never Used  . Alcohol use No  . Drug use: No  . Sexual activity: Not Currently    Birth control/ protection: None   Other Topics Concern  . Not on file   Social History Narrative  . No narrative on file    Allergies: Allergies  Allergen Reactions  . Codeine Phosphate [Codeine]     GI upset  . Penicillins     GI upset    Current Medications: Current Outpatient Prescriptions  Medication Sig Dispense Refill  . acetaminophen (TYLENOL) 500 MG tablet Take by mouth 2 (two) times daily.     Marland Kitchen amitriptyline (ELAVIL) 50 MG tablet Take 50 mg by mouth at bedtime.    Marland Kitchen amLODipine (NORVASC) 5 MG tablet Take 5 mg by mouth daily.    Marland Kitchen aspirin 81 MG tablet Take 81 mg by mouth daily.    Marland Kitchen azelastine (ASTELIN) 0.1 % nasal spray Place 1 spray into both nostrils 2 (two) times daily. Use in each nostril as directed    . Calcium 600-200 MG-UNIT tablet Take 1 tablet by mouth 2 (two) times daily.    Marland Kitchen Dextran 70-Hypromellose (ARTIFICIAL TEARS) 0.1-0.3 % SOLN Apply 1 drop to eye 2 (two) times daily as needed.     . docusate sodium (COLACE) 100 MG capsule Take 200 mg by mouth daily.    . fluticasone (FLONASE) 50 MCG/ACT nasal spray Place 2 sprays into both nostrils daily.    Marland Kitchen gabapentin (NEURONTIN) 600 MG tablet Take by mouth 2 (two) times daily.     . Infant Care Products (DERMACLOUD EX) Apply topically. 2 times a day when needed    . loratadine (CLARITIN) 10 MG tablet Take 10 mg by mouth daily.    Marland Kitchen lovastatin (ALTOPREV) 40 MG 24 hr tablet Take 80 mg by mouth at bedtime.     . magnesium hydroxide (MILK OF MAGNESIA) 400 MG/5ML suspension Take 30 mLs by mouth daily as needed.     . meloxicam (MOBIC) 15 MG tablet Take 15 mg by mouth daily.     . Menthol, Topical Analgesic, (BIOFREEZE) 4 %  GEL Apply 1 application topically 3 (three) times daily as needed.    . metFORMIN (GLUCOPHAGE) 500 MG tablet Take 500 mg by mouth 2 (two) times daily with a meal.    . metoprolol (LOPRESSOR) 100 MG tablet Take 100 mg by mouth 2 (two) times daily.     Marland Kitchen omeprazole (PRILOSEC) 20 MG capsule Take  20 mg by mouth daily.    Marland Kitchen oxybutynin (DITROPAN XL) 15 MG 24 hr tablet Take 15 mg by mouth at bedtime.    . polyethylene glycol powder (GLYCOLAX/MIRALAX) powder Take 0.5 Containers by mouth 3 (three) times a week.     . senna-docusate (SENOKOT-S) 8.6-50 MG tablet Take 1 tablet by mouth 2 (two) times daily.     . Throat Lozenges (MENTHOL COUGH DROPS MT) Use as directed 1 lozenge in the mouth or throat every 2 (two) hours as needed.     No current facility-administered medications for this visit.     Review of Systems Per HPI   Objective:  Physical Examination:  BP (!) 142/83   Pulse 89   Temp 99.2 F (37.3 C) (Tympanic)   Resp 18   Ht 5\' 7"  (1.702 m)   Wt (!) 318 lb 14.4 oz (144.7 kg)   BMI 49.95 kg/m    ECOG Performance Status: 1 - Symptomatic but completely ambulatory  General appearance: alert, cooperative and appears stated age HEENT:PERRLA, extra ocular movement intact, neck supple with midline trachea and thyroid without masses Lymph node survey: non-palpable, axillary, inguinal, supraclavicular Cardiovascular: regular rate and rhythm, no murmurs or gallops Respiratory: normal air entry, lungs clear to auscultation and no rales, rhonchi or wheezing Breast exam: not examined. Abdomen: soft, non-tender, without masses or organomegaly, no hernias and well healed incision Back: inspection of back is normal Extremities: extremities normal, atraumatic, no cyanosis or edema Skin exam - normal coloration and turgor, no rashes, no suspicious skin lesions noted. Neurological exam reveals alert, oriented, normal speech, no focal findings or movement disorder noted.  Pelvic: exam chaperoned by  nurse;  Vulva: normal appearing vulva with no masses, tenderness or lesions; Vagina: normal vagina; Adnexa: normal adnexa in size, nontender and no masses; Uterus: uterus is normal size, shape, consistency and nontender; Cervix: no lesions; Rectal: normal rectal, no masses      Assessment:  CALANDRIA MULLINGS is a 66 y.o. female with postmenopausal bleeding and endometrial biopsy concerning for mucinous cancer, but very small tissue sample.    Medical co-morbidities complicating care: Morbid obesity (BMI 50), Diabetes with foot ulcer and osteomyelitis, large ventral hernia, history of colon and breast cancer.   Plan:   Problem List Items Addressed This Visit    None    Visit Diagnoses    Malignant neoplasm of body of uterus, unspecified site [facility])    -  Primary      I discussed management with the patient and Dr Marcelline Mates.  She is not a good surgical candidate, so recommend hysteroscopy and D&C.  May also want to do ECC and/or cone biopsy in view of possible mucinous histology on biopsy.  Dr. Marcelline Mates will do this procedure in the near future.  If she has cancer I would probably want to do the hysterectomy at [facility] and involve plastic surgery for ventral hernia repair.    The patient's diagnosis, an outline of the further diagnostic and laboratory studies which will be required, the recommendation, and alternatives were discussed.  All questions were answered to the patient's satisfaction.  A total of 60 minutes were spent with the patient/family today; 40% was spent in education, counseling and coordination of care for possible uterine cancer.    Mellody Drown, MD  CC:  Denton Lank, MD [address] [place], [place] [address] [phone]

## 2017-01-04 NOTE — Progress Notes (Signed)
[facility]. No gyn bleeding or spotting or pain.

## 2017-01-09 IMAGING — CR DG CHEST 2V
1 series · 2 of 2 positions shown · non-contrast
Comparison: 06/20/2012

CLINICAL DATA: Un healing wound on bottom of feet. Pre hyperbaric
chamber. Diabetic.

EXAM:
CHEST  2 VIEW

[Series 1: dxr chest pa (or ap) and lateral · 0.14mm/px · 2 of 2 slices shown]
[im 1/2]
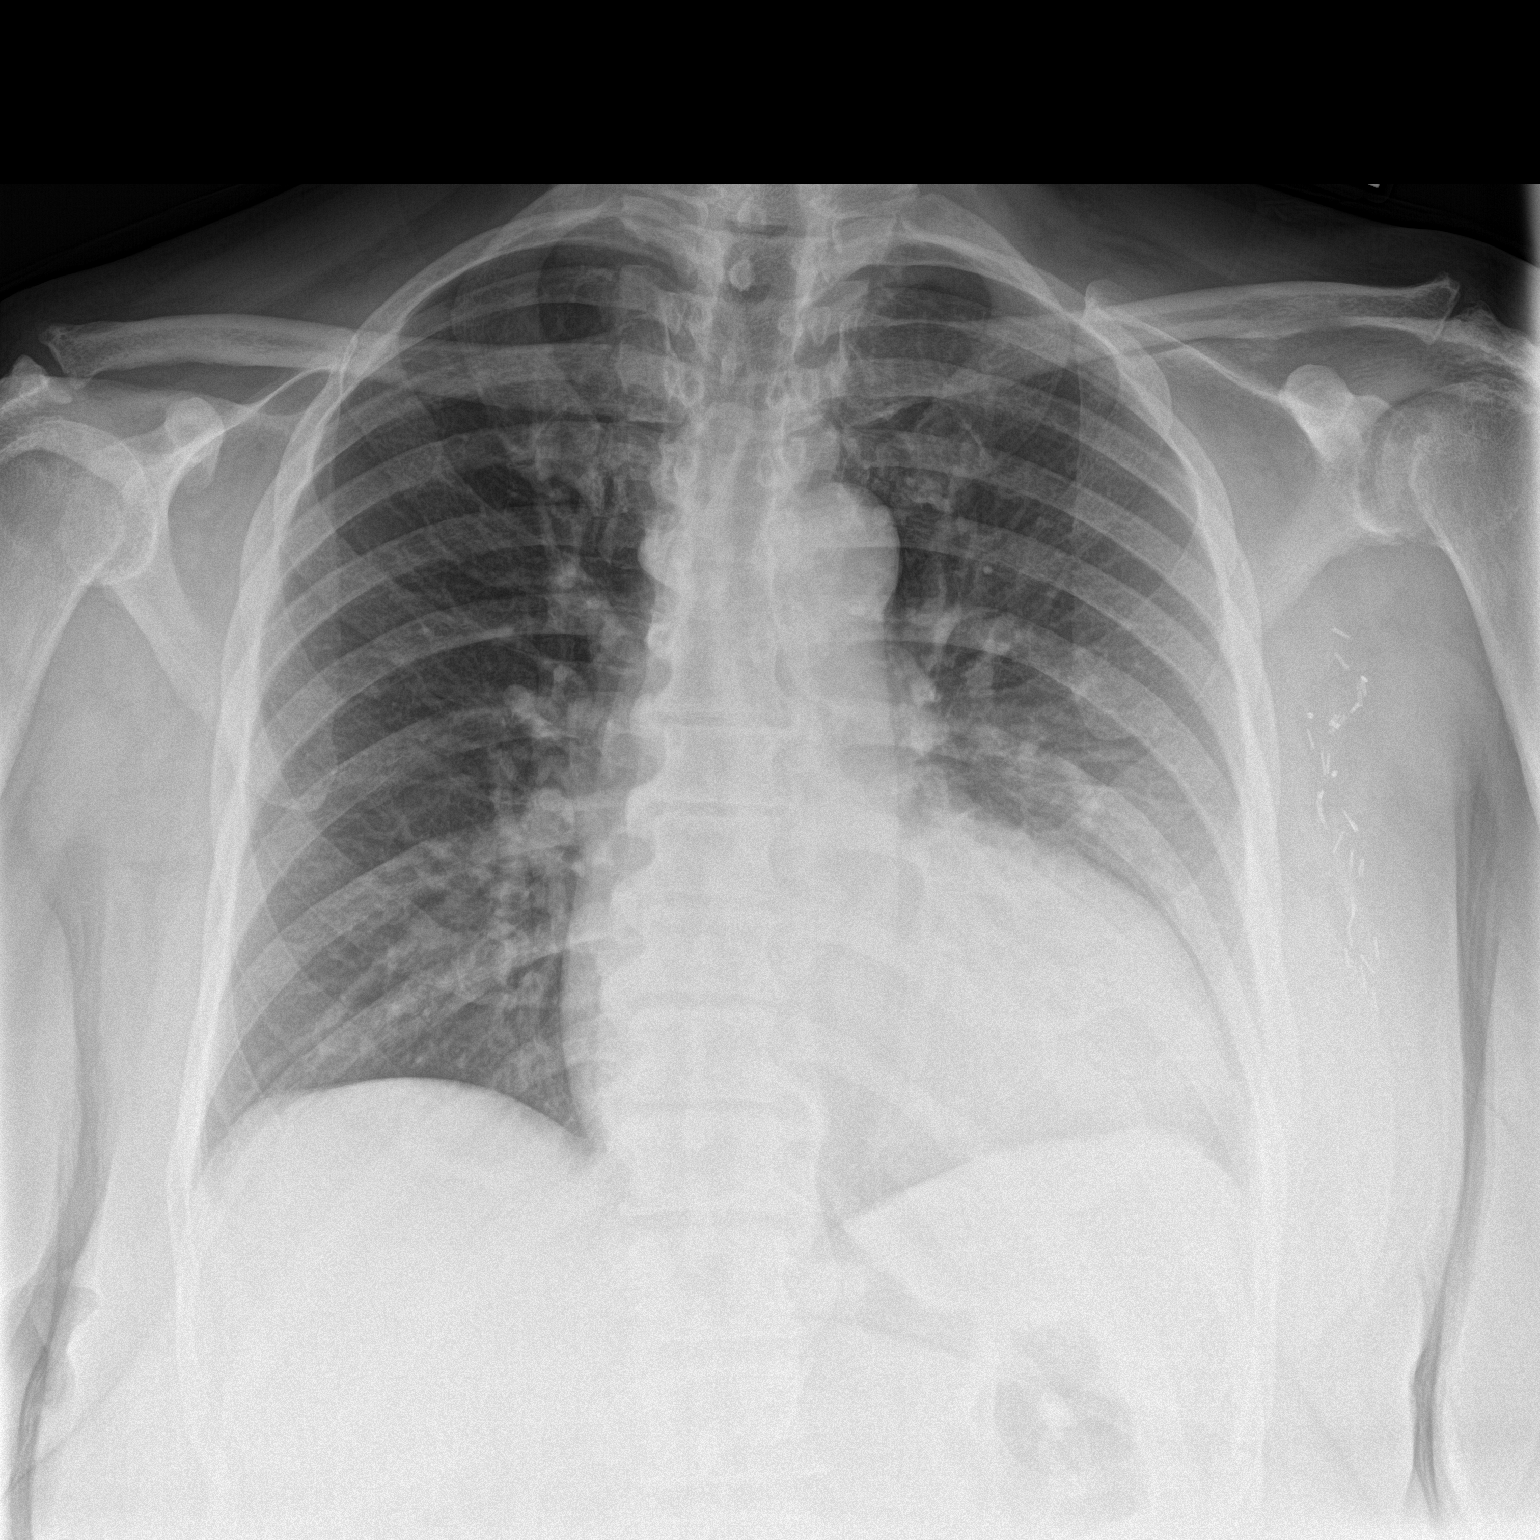
[im 2/2]
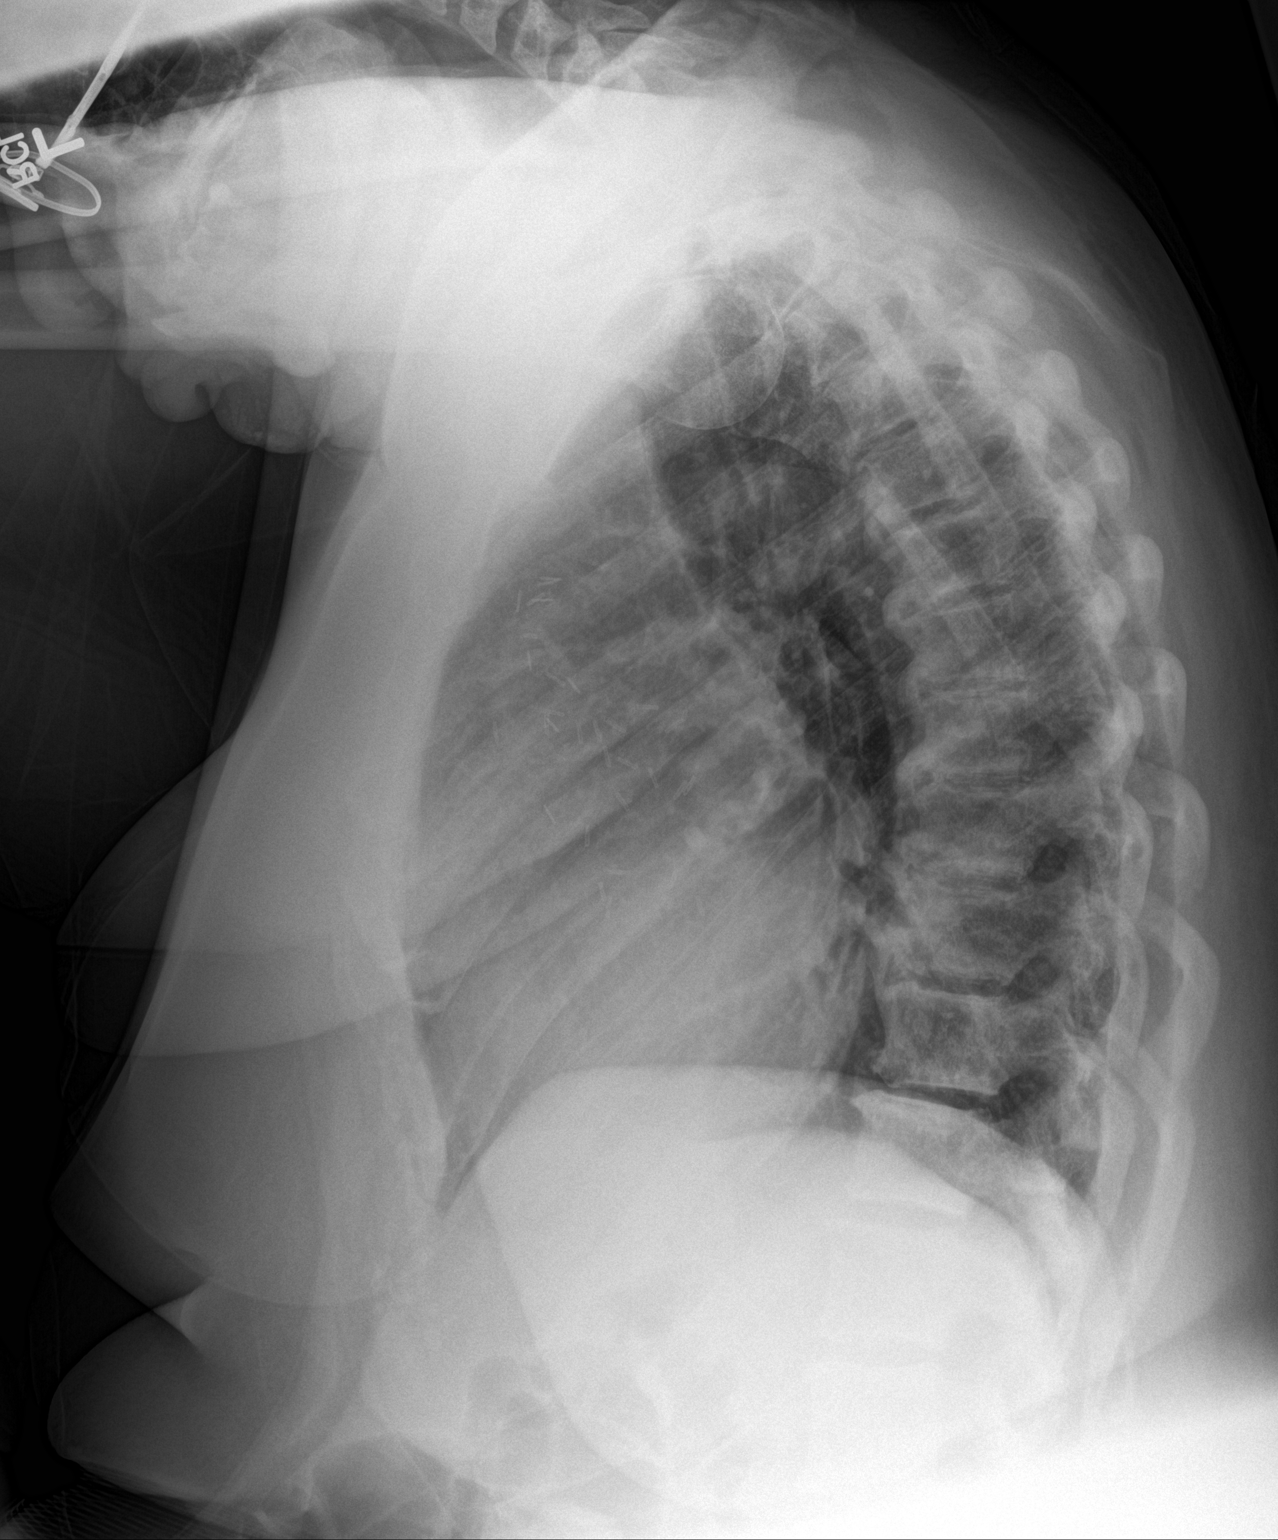

[2 of 2 positions shown; findings below may reference images not displayed]

FINDINGS: Surgical clips in the left axilla. Mild cardiomegaly. No focal
airspace opacity, effusion or edema. No acute bony abnormality.
IMPRESSION: Mild cardiomegaly.  No active disease.

## 2017-01-17 ENCOUNTER — Encounter: Payer: Self-pay | Admitting: Obstetrics and Gynecology

## 2017-01-17 ENCOUNTER — Ambulatory Visit (INDEPENDENT_AMBULATORY_CARE_PROVIDER_SITE_OTHER): Payer: Medicare Other | Admitting: Obstetrics and Gynecology

## 2017-01-17 VITALS — BP 113/64 | HR 90 | Ht 67.0 in | Wt 318.0 lb

## 2017-01-17 DIAGNOSIS — L97509 Non-pressure chronic ulcer of other part of unspecified foot with unspecified severity: Secondary | ICD-10-CM | POA: Diagnosis not present

## 2017-01-17 DIAGNOSIS — C541 Malignant neoplasm of endometrium: Secondary | ICD-10-CM | POA: Diagnosis not present

## 2017-01-17 DIAGNOSIS — Z862 Personal history of diseases of the blood and blood-forming organs and certain disorders involving the immune mechanism: Secondary | ICD-10-CM

## 2017-01-17 DIAGNOSIS — E11621 Type 2 diabetes mellitus with foot ulcer: Secondary | ICD-10-CM

## 2017-01-17 DIAGNOSIS — Z859 Personal history of malignant neoplasm, unspecified: Secondary | ICD-10-CM | POA: Diagnosis not present

## 2017-01-17 NOTE — H&P (Addendum)
GYNECOLOGY PRE-OPERATIVE HISTORY AND PHYSICAL  Subjective:    Patient is a 66 y.o. [id number] female scheduled for Hysteroscopy D&C. Indications for procedure are abnormal endometrial biopsy with cells suspicious for endometrial mucinous adenocarcinoma. Of note, patient has a past history of breast and colon cancer.    Pertinent Gynecological History: No LMP recorded. Patient is postmenopausal.  Last mammogram: 11/05/2015. Results were normal.  Last colonoscopy: 08/2015.  Results were: adenomatous and hyperplastic polyps.  No malignancy. Internal hemorrhoids. Repeat in 3-5 years.   Discussed Blood/Blood Products: no   Menstrual History: Menarche age: 72 No LMP recorded. Patient is postmenopausal.    Past Medical History:  Diagnosis Date  . Breast cancer ([place])    left breast mastectomy  . Breast cancer ([place])   . 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[place])   . Colon cancer ([place])   . Dysrhythmia   . Edema    FEET AND LEGS  . GERD (gastroesophageal reflux disease)   . Heart murmur   . History of hiatal hernia   . History of tachycardia   . Hypertension   . Iron deficiency anemia   . Morbid obesity ([place])   . 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 Osteoporosis, post-menopausal   . Shortness of breath dyspnea   . Type 2 diabetes mellitus without complication ([place])   . Valvular heart disease     Past Surgical History:  Procedure Laterality Date  . CATARACT EXTRACTION W/PHACO Right 01/26/2016   Procedure: CATARACT EXTRACTION PHACO AND INTRAOCULAR LENS PLACEMENT (IOC);  Surgeon: Birder Robson, MD;  Location: [facility] ORS;  Service: Ophthalmology;  Laterality: Right;  [place]  00:26 AP% 21.5 CDE 5.70 Fluid pack lot # [id number]  . COLON SURGERY     RESECTION  . COLONOSCOPY    . COLONOSCOPY WITH PROPOFOL N/A 09/07/2015   Procedure: COLONOSCOPY WITH PROPOFOL;  Surgeon: Manya Silvas, MD;  Location: [facility] ENDOSCOPY;  Service: Endoscopy;   Laterality: N/A;  . DILATION AND CURETTAGE OF UTERUS    . ESOPHAGOGASTRODUODENOSCOPY    . EYE SURGERY    . INCISION AND DRAINAGE Bilateral    FOOT LESIONS  . KNEE ARTHROSCOPY Left   . LAPAROSCOPIC RIGHT COLECTOMY  2002  . MASTECTOMY Left 1992   Left mastectomy with right breast reduction for breast cancer  . OTHER SURGICAL HISTORY     Gastric Sleeve  . TONSILLECTOMY      OB History  Gravida Para Term Preterm AB Living  3 1 1   2 1   SAB TAB Ectopic Multiple Live Births  2       1    # Outcome Date GA Lbr Len/2nd Weight Sex Delivery Anes PTL Lv  3 Term 56    M    LIV  2 SAB           1 SAB               History reviewed. No pertinent family history.   Social History   Social History  . Marital status: Legally Separated    Spouse name: N/A  . Number of children: N/A  . Years of education: N/A   Social History Main Topics  . Smoking status: Never Smoker  . Smokeless tobacco: Never Used  . Alcohol use No  . Drug use: No  . Sexual activity: Not Currently    Birth control/ protection: None   Other Topics Concern  .  None   Social History Narrative  . None    Allergies  Allergen Reactions  . Codeine Phosphate [Codeine]     GI upset  . Penicillins     GI upset    Review of Systems Constitutional: No recent fever/chills/sweats Respiratory: No recent cough/bronchitis Cardiovascular: No chest pain Gastrointestinal: No recent nausea/vomiting/diarrhea Genitourinary: No UTI symptoms Hematologic/lymphatic:No history of coagulopathy or recent blood thinner use    Objective:    BP 113/64 (BP Location: Left Arm, Patient Position: Sitting, Cuff Size: Large)   Pulse 90   Ht 5\' 7"  (1.702 m)   Wt (!) 318 lb (144.2 kg)   BMI 49.81 kg/m   General:   Normal  Skin:   Diabetic foot ulcer on right (osteomyelitis)  HEENT:  Normal  Neck:  Supple without Adenopathy or Thyromegaly  Lungs:   Heart:              Breasts:   Abdomen:  Pelvis:  M/S   Extremeties:    Neuro:    clear to auscultation bilaterally   Normal without murmur   Not Examined   soft, non-tender; bowel sounds normal; no masses,  no organomegaly   Exam deferred to OR  No CVAT.    Warm/Dry   Normal       Labs:  Lab Results  Component Value Date   WBC 6.6 12/02/2016   HGB 10.7 (L) 12/02/2016   HCT 32.9 (L) 12/02/2016   MCV 82.5 12/02/2016   PLT 400 12/02/2016    Lab Results  Component Value Date   TSH 0.948 08/19/2014    Pathology:  Diagnosis:  ENDOMETRIUM, BIOPSY:  SMALL FRAGMENTS OF ATYPICAL GLANDS, SUSPICIOUS FOR MUCINOUS  ADENOCARCOINOMA OF THE ENDOMETRIUM, MIXED WITH DEGENERATED TISSUE, AND FEW BENIGN ENDOMETRIAL GLANDS.I  COMMENT: THE BIOPSY IS SCANT, RECOMMEND ADDITIONAL BIOPSY FOR BETTER  EVALUATION. THIS CASE WAS REVIEWED IN INTRADEPARTMENTAL CONSULTATION AND THE DIAGNOSIS REFLECTS OUR CONSENSUS OPINION.  Assessment:    Suspected endometrial cancer Morbid obesity Type II DM History of cancer (breast and colon) H/o anemia  Plan:    Counseling: Procedure, risks, reasons, benefits and complications (including injury to bowel, bladder, major blood vessel, ureter, bleeding, possibility of transfusion, infection, or fistula formation) reviewed in detail. Consent signed. Preop testing ordered. Instructions reviewed, including NPO after midnight.   Rubie Maid, MD [facility]

## 2017-01-17 NOTE — Patient Instructions (Addendum)
You are scheduled for surgery on 02/06/2017.  Nothing to eat after midnight on day prior to surgery.  Do not take any medications unless recommended by your provider on day prior to surgery.  Do not take NSAIDs (Motrin, Aleve) or aspirin 7 days prior to surgery.  You may take Tylenol products for minor aches and pains.  You will receive a prescription for pain medications post-operatively.  You will be contacted by phone approximately 1 week prior to surgery to schedule pre-operative appointment.  Please call the office if you have any questions regarding your upcoming surgery.     Outpatient Surgery, Adult An outpatient surgery is a procedure that does not require an overnight stay at a hospital or clinic. A person having an outpatient surgery can go home hours after the surgery is complete. Let your health care provider know about:  Any allergies you have.  All medicines you are taking, including vitamins, herbs, eye drops, creams, and over-the-counter medicines.  Any problems you or family members have had with anesthetic medicines.  Any blood disorders you have.  Any surgeries you have had.  Any medical conditions you have.  Any use of cigarettes, alcohol, marijuana, or street drugs.  Whether you are pregnant or may be pregnant. What are the risks? The risk and complications of surgery depend on the specific procedure. Common risks and complications include:  Infection.  Bleeding.  Allergic reactions to medicines.  Temporary increase in pain.  Failure to fix the problem that the surgery was meant to fix.  Before the procedure Staying hydrated  Follow instructions from your health care provider about hydration, which may include:  Up to 2 hours before the procedure - you may continue to drink clear liquids, such as water, clear fruit juice, black coffee, and plain tea.  Eating and drinking restrictions Follow instructions from your health care provider about eating  and drinking, which may include:  8 hours before the procedure - stop eating heavy meals or foods such as meat, fried foods, or fatty foods.  6 hours before the procedure - stop eating light meals or foods, such as toast or cereal.  6 hours before the procedure - stop drinking milk or drinks that contain milk.  2 hours before the procedure - stop drinking clear liquids.  Medicines Ask your health care provider about:  Changing or stopping your regular medicines. This is especially important if you are taking diabetes medicines or blood thinners.  Taking medicines such as aspirin and ibuprofen. These medicines can thin your blood. Do not take these medicines before the procedure if your health care provider instructs you not to.  General instructions  Do not use any tobacco products, such as cigarettes, chewing tobacco, and e-cigarettes, for as long as possible before the procedure. If you need help quitting, ask your health care provider. Quitting lowers your risk for complications during and after surgery.  Plan to have someone take you home from the hospital or clinic and stay with you for 24 hours.  Ask your health care provider how your surgical site will be marked or identified.  Call your health care provider if you develop an illness or a problem that may prevent you from safely having your procedure. What happens during the procedure?  You will be settled on an operating table.  To reduce your risk of infection: ? Your health care team will wash or sanitize their hands. ? Your skin will be washed with soap. ? Hair may be  removed from the surgical area.  You will be connected to heart, blood pressure, and oxygen monitors.  You may be given one or more of the following: ? A medicine to help you relax (sedative). ? A medicine to numb the area (local anesthetic). ? A medicine to make you fall asleep (general anesthetic). ? A medicine that is injected into your spine to  numb the area below and slightly above the injection site (spinal anesthetic). ? A medicine that is injected into an area of your body to numb everything below the injection site (regional anesthetic). The specifics of the procedure will depend on the type of procedure that you are having. The procedure may also vary among health care providers and hospitals. After the procedure  Your blood pressure, heart rate, breathing rate, and blood oxygen level will be monitored until the medicines you were given have worn off.  Do not drive for 24 hours if you received a sedative.  If there are no complications, you will be allowed to go home with a responsible adult when you are awake, stable, and taking fluids well.  The surgical site will feel tender.  You may feel nauseous and have some swelling, bruising, and numbness around the surgical site. This information is not intended to replace advice given to you by your health care provider. Make sure you discuss any questions you have with your health care provider. Document Released: 02/01/2001 Document Revised: 10/20/2015 Document Reviewed: 08/30/2015 Elsevier Interactive Patient Education  [employer].

## 2017-01-17 NOTE — Progress Notes (Signed)
GYNECOLOGY PROGRESS NOTE  Subjective:    Patient ID: [name], female    DOB: 02-Oct-1950, 66 y.o.   MRN: [id number]  HPI  Patient is a 66 y.o. [id number] morbidly obese postmenopausal female with a past history of breast, colon cancer, and TYpe II DM  who presents for follow up discussion and pre-operative exam.  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 Was referred to GYN Oncology to determine next steps (proceeding with D&C sent for frozen section with subsequent hysterectomy and cancer staging if noted to be malignant, vs performing a D&C for more definitive diagnosis).  Per Oncology, they would prefer to have the D&C performed first .  Patient presents to schedule surgery.   The following portions of the patient's history were reviewed and updated as appropriate: allergies, current medications, past family history, past medical history, past social history, past surgical history and problem list.  Review of Systems Pertinent items noted in HPI and remainder of comprehensive ROS otherwise negative.   Objective:   Blood pressure 113/64, pulse 90, height 5\' 7"  (1.702 m), weight (!) 318 lb (144.2 kg). General appearance: alert and no distress See H&P for remainder of exam.     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 THIS CASE WAS REVIEWED IN  INTRADEPARTMENTAL CONSULTATION AND THE DIAGNOSIS REFLECTS OUR CONSENSUS OPINION.   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  The risks of surgery were discussed in detail with the patient including but not limited to: bleeding which may require transfusion or reoperation; infection which may require prolonged hospitalization or re-hospitalization and antibiotic therapy; injury to bowel, bladder, and major vessels or other surrounding organs; need for additional procedures including laparotomy; thromboembolic phenomenon, incisional problems and other postoperative or anesthesia complications.  The postoperative expectations were also discussed in detail. The patient also understands the alternative treatment options which were discussed in full. All questions were answered.  She was told that she will be contacted by our surgical scheduler regarding the time and date of her surgery; routine preoperative instructions of having nothing to eat or drink after midnight on the day prior to surgery and also coming to the hospital 1.5 hours prior to her time of surgery were also emphasized.  She was told she may be called for a preoperative appointment about a week prior to surgery and will be given further preoperative instructions at that visit. Printed patient education handouts about the procedure were given to the patient to review at home.  Patient to be scheduled for  - If pathology does return positive for malignancy, will refer back to GYN Oncology.   A total of 15 minutes were spent face-to-face with the patient during this encounter and over half of  that time dealt with counseling and coordination of care.    Rubie Maid, MD [facility]

## 2017-01-31 ENCOUNTER — Encounter
Admission: RE | Admit: 2017-01-31 | Discharge: 2017-01-31 | Disposition: A | Discharge status: Home / Self Care | Payer: Medicare Other | Source: Ambulatory Visit | Attending: Obstetrics and Gynecology | Admitting: Obstetrics and Gynecology

## 2017-01-31 ENCOUNTER — Other Ambulatory Visit: Payer: Medicare Other

## 2017-01-31 DIAGNOSIS — I10 Essential (primary) hypertension: Secondary | ICD-10-CM | POA: Insufficient documentation

## 2017-01-31 DIAGNOSIS — Z853 Personal history of malignant neoplasm of breast: Secondary | ICD-10-CM | POA: Insufficient documentation

## 2017-01-31 DIAGNOSIS — E11610 Type 2 diabetes mellitus with diabetic neuropathic arthropathy: Secondary | ICD-10-CM | POA: Insufficient documentation

## 2017-01-31 DIAGNOSIS — Z01812 Encounter for preprocedural laboratory examination: Secondary | ICD-10-CM | POA: Insufficient documentation

## 2017-01-31 DIAGNOSIS — E7800 Pure hypercholesterolemia, unspecified: Secondary | ICD-10-CM | POA: Insufficient documentation

## 2017-01-31 DIAGNOSIS — I498 Other specified cardiac arrhythmias: Secondary | ICD-10-CM | POA: Diagnosis not present

## 2017-01-31 DIAGNOSIS — Z01810 Encounter for preprocedural cardiovascular examination: Secondary | ICD-10-CM | POA: Insufficient documentation

## 2017-01-31 DIAGNOSIS — M869 Osteomyelitis, unspecified: Secondary | ICD-10-CM | POA: Insufficient documentation

## 2017-01-31 DIAGNOSIS — E11621 Type 2 diabetes mellitus with foot ulcer: Secondary | ICD-10-CM | POA: Insufficient documentation

## 2017-01-31 DIAGNOSIS — M2550 Pain in unspecified joint: Secondary | ICD-10-CM | POA: Diagnosis not present

## 2017-01-31 DIAGNOSIS — K432 Incisional hernia without obstruction or gangrene: Secondary | ICD-10-CM | POA: Diagnosis not present

## 2017-01-31 DIAGNOSIS — L97509 Non-pressure chronic ulcer of other part of unspecified foot with unspecified severity: Secondary | ICD-10-CM | POA: Insufficient documentation

## 2017-01-31 HISTORY — DX: Carpal tunnel syndrome, unspecified upper limb: G56.00

## 2017-01-31 LAB — BASIC METABOLIC PANEL
Anion gap: 9 (ref 5–15)
BUN: 21 mg/dL — ABNORMAL HIGH (ref 6–20)
CALCIUM: 10 mg/dL (ref 8.9–10.3)
CO2: 29 mmol/L (ref 22–32)
CREATININE: 0.96 mg/dL (ref 0.44–1.00)
Chloride: 97 mmol/L — ABNORMAL LOW (ref 101–111)
GFR calc Af Amer: >60 mL/min (ref >60)
GFR, EST NON AFRICAN AMERICAN: 60 mL/min — AB (ref >60)
Glucose, Bld: 160 mg/dL — ABNORMAL HIGH (ref 65–99)
Potassium: 4.3 mmol/L (ref 3.5–5.1)
SODIUM: 135 mmol/L (ref 135–145)

## 2017-01-31 LAB — CBC
HCT: 33.3 % — ABNORMAL LOW (ref 35.0–47.0)
Hemoglobin: 10.9 g/dL — ABNORMAL LOW (ref 12.0–16.0)
MCH: 27.1 pg (ref 26.0–34.0)
MCHC: 32.8 g/dL (ref 32.0–36.0)
MCV: 82.8 fL (ref 80.0–100.0)
PLATELETS: 384 10*3/uL (ref 150–440)
RBC: 4.02 MIL/uL (ref 3.80–5.20)
RDW: 15.9 % — ABNORMAL HIGH (ref 11.5–14.5)
WBC: 7.3 10*3/uL (ref 3.6–11.0)

## 2017-01-31 NOTE — Patient Instructions (Signed)
Your procedure is scheduled on: February 06, 2017 [facility] ) Report to Same Day Surgery 2nd floor medical mall ([place] Entrance-take elevator on left to 2nd floor.  Check in with surgery information desk.) To find out your arrival time please call [phone] between 1PM - 3PM on February 03, 2017 (FRIDAY )  Remember: Instructions that are not followed completely may result in serious medical risk, up to and including death, or upon the discretion of your surgeon and anesthesiologist your surgery may need to be rescheduled.    _x___ 1. Do not eat food after midnight the night before your procedure. You may drink clear liquids up to 2 hours before you are scheduled to arrive at the hospital for your procedure.  Do not drink clear liquids within 2 hours of your scheduled arrival to the hospital.  Clear liquids include  --Water or Apple juice without pulp  --Clear carbohydrate beverage such as ClearFast or Gatorade  --Black Coffee or Clear Tea (No milk, no creamers, do not add anything to                  the coffee or Tea Type 1 and type 2 diabetics should only drink water.  No gum chewing or hard candies.     __x__ 2. No Alcohol for 24 hours before or after surgery.   __x__3. No Smoking for 24 prior to surgery.   ____  4. Bring all medications with you on the day of surgery if instructed.    __x__ 5. Notify your doctor if there is any change in your medical condition     (cold, fever, infections).     Do not wear jewelry, make-up, hairpins, clips or nail polish.  Do not wear lotions, powders, or perfumes. You may wear deodorant.  Do not shave 48 hours prior to surgery. Men may shave face and neck.  Do not bring valuables to the hospital.    [facility] is not responsible for any belongings or valuables.               Contacts, dentures or bridgework may not be worn into surgery.  Leave your suitcase in the car. After surgery it may be brought to your room.  For patients  admitted to the hospital, discharge time is determined by your treatment team               Patients discharged the day of surgery will not be allowed to drive home.  You will need someone to drive you home and stay with you the night of your procedure.    Please read over the following fact sheets that you were given:   [facility] Preparing for Surgery and or MRSA Information  TAKE THE FOLLOWING MEDICATIONS WITH A SIP OF WATER THE MORNING OF SURGERY :   1. OMEPRAZOLE (PRILOSEC )   2.AMLODIPINE (NORVASC )  3 GABAPENTIN ( NEURONTIN)  4. METOPROLOL (LOPRESSOR )    ____Fleets enema or Magnesium Citrate as directed.   _x___ Use CHG Soap or sage wipes as directed on instruction sheet   ____ Use inhalers on the day of surgery and bring to hospital day of surgery  _X___ Stop Metformin  2 days prior to surgery. (STOP METFORMIN ON SEPTEMBER 15 )    ____ Take 1/2 of usual insulin dose the night before surgery and none on the morning     surgery.   _x___ Follow recommendations from Cardiologist, Pulmonologist or PCP regarding  stopping Aspirin, Coumadin, Plavix ,Eliquis, Effient, or Pradaxa, and Pletal. (STOP ASPIRIN NOW )  X____Stop Anti-inflammatories such as Advil, Aleve, Ibuprofen, Motrin, Naproxen, Naprosyn, Goodies powders or aspirin products. OK to take Tylenol (STOP MOBIC NOW )  _x___ Stop supplements until after surgery.  But may continue Vitamin D, Vitamin B,       and multivitamin.   ____ Bring C-Pap to the hospital.

## 2017-02-06 ENCOUNTER — Ambulatory Visit: Payer: Medicare Other | Admitting: Registered Nurse

## 2017-02-06 ENCOUNTER — Encounter
Admission: RE | Disposition: A | Discharge status: Nursing Home (Includes ICF) | Payer: Self-pay | Source: Ambulatory Visit | Attending: Obstetrics and Gynecology

## 2017-02-06 ENCOUNTER — Ambulatory Visit
Admission: RE | Admit: 2017-02-06 | Discharge: 2017-02-06 | Disposition: A | Discharge status: Nursing Home (Includes ICF) | Payer: Medicare Other | Source: Ambulatory Visit | Attending: Obstetrics and Gynecology | Admitting: Obstetrics and Gynecology

## 2017-02-06 DIAGNOSIS — I10 Essential (primary) hypertension: Secondary | ICD-10-CM | POA: Insufficient documentation

## 2017-02-06 DIAGNOSIS — Z853 Personal history of malignant neoplasm of breast: Secondary | ICD-10-CM | POA: Insufficient documentation

## 2017-02-06 DIAGNOSIS — Z85038 Personal history of other malignant neoplasm of large intestine: Secondary | ICD-10-CM | POA: Diagnosis not present

## 2017-02-06 DIAGNOSIS — Z79899 Other long term (current) drug therapy: Secondary | ICD-10-CM | POA: Insufficient documentation

## 2017-02-06 DIAGNOSIS — Z7982 Long term (current) use of aspirin: Secondary | ICD-10-CM | POA: Diagnosis not present

## 2017-02-06 DIAGNOSIS — N858 Other specified noninflammatory disorders of uterus: Secondary | ICD-10-CM | POA: Diagnosis not present

## 2017-02-06 DIAGNOSIS — Z9012 Acquired absence of left breast and nipple: Secondary | ICD-10-CM | POA: Diagnosis not present

## 2017-02-06 DIAGNOSIS — Z6841 Body Mass Index (BMI) 40.0 and over, adult: Secondary | ICD-10-CM | POA: Diagnosis not present

## 2017-02-06 DIAGNOSIS — I252 Old myocardial infarction: Secondary | ICD-10-CM | POA: Diagnosis not present

## 2017-02-06 DIAGNOSIS — N950 Postmenopausal bleeding: Secondary | ICD-10-CM | POA: Diagnosis not present

## 2017-02-06 DIAGNOSIS — K219 Gastro-esophageal reflux disease without esophagitis: Secondary | ICD-10-CM | POA: Diagnosis not present

## 2017-02-06 DIAGNOSIS — Z9049 Acquired absence of other specified parts of digestive tract: Secondary | ICD-10-CM | POA: Insufficient documentation

## 2017-02-06 DIAGNOSIS — E119 Type 2 diabetes mellitus without complications: Secondary | ICD-10-CM | POA: Insufficient documentation

## 2017-02-06 DIAGNOSIS — Z7984 Long term (current) use of oral hypoglycemic drugs: Secondary | ICD-10-CM | POA: Insufficient documentation

## 2017-02-06 HISTORY — PX: HYSTEROSCOPY WITH D & C: SHX1775

## 2017-02-06 LAB — GLUCOSE, CAPILLARY
GLUCOSE-CAPILLARY: 153 mg/dL — AB (ref 65–99)
Glucose-Capillary: 135 mg/dL — ABNORMAL HIGH (ref 65–99)

## 2017-02-06 SURGERY — DILATATION AND CURETTAGE /HYSTEROSCOPY
Anesthesia: General | Site: Uterus | Wound class: Clean Contaminated

## 2017-02-06 MED ORDER — HYDROCODONE-ACETAMINOPHEN 5-325 MG PO TABS: 1.0000 | ORAL_TABLET | Freq: Four times a day (QID) | ORAL | 0 refills | Status: DC | PRN

## 2017-02-06 MED ORDER — ACETAMINOPHEN 10 MG/ML IV SOLN
INTRAVENOUS | Status: DC | PRN
  Administered 2017-02-06: 1000 mg via INTRAVENOUS

## 2017-02-06 MED ORDER — MIDAZOLAM HCL 2 MG/2ML IJ SOLN
INTRAMUSCULAR | Status: DC | PRN
  Administered 2017-02-06: 2 mg via INTRAVENOUS

## 2017-02-06 MED ORDER — LIDOCAINE HCL (PF) 2 % IJ SOLN
INTRAMUSCULAR | Status: AC
  Filled 2017-02-06: qty 6

## 2017-02-06 MED ORDER — ONDANSETRON HCL 4 MG/2ML IJ SOLN: 4.0000 mg | Freq: Once | INTRAMUSCULAR | Status: DC | PRN

## 2017-02-06 MED ORDER — PROPOFOL 10 MG/ML IV BOLUS
INTRAVENOUS | Status: DC | PRN
  Administered 2017-02-06: 200 mg via INTRAVENOUS

## 2017-02-06 MED ORDER — LIDOCAINE HCL (CARDIAC) 20 MG/ML IV SOLN
INTRAVENOUS | Status: DC | PRN
  Administered 2017-02-06: 100 mg via INTRAVENOUS

## 2017-02-06 MED ORDER — ONDANSETRON HCL 4 MG/2ML IJ SOLN
INTRAMUSCULAR | Status: AC
  Filled 2017-02-06: qty 2

## 2017-02-06 MED ORDER — PHENYLEPHRINE HCL 10 MG/ML IJ SOLN
INTRAMUSCULAR | Status: DC | PRN
  Administered 2017-02-06 (×3): 100 ug via INTRAVENOUS

## 2017-02-06 MED ORDER — SUCCINYLCHOLINE CHLORIDE 20 MG/ML IJ SOLN
INTRAMUSCULAR | Status: DC | PRN
  Administered 2017-02-06: 100 mg via INTRAVENOUS

## 2017-02-06 MED ORDER — FENTANYL CITRATE (PF) 100 MCG/2ML IJ SOLN
INTRAMUSCULAR | Status: AC
  Filled 2017-02-06: qty 2

## 2017-02-06 MED ORDER — FENTANYL CITRATE (PF) 100 MCG/2ML IJ SOLN
INTRAMUSCULAR | Status: DC | PRN
  Administered 2017-02-06 (×2): 25 ug via INTRAVENOUS
  Administered 2017-02-06: 50 ug via INTRAVENOUS

## 2017-02-06 MED ORDER — FENTANYL CITRATE (PF) 100 MCG/2ML IJ SOLN: 25.0000 ug | INTRAMUSCULAR | Status: DC | PRN

## 2017-02-06 MED ORDER — DEXAMETHASONE SODIUM PHOSPHATE 10 MG/ML IJ SOLN
INTRAMUSCULAR | Status: DC | PRN
  Administered 2017-02-06: 10 mg via INTRAVENOUS

## 2017-02-06 MED ORDER — ACETAMINOPHEN NICU IV SYRINGE 10 MG/ML
INTRAVENOUS | Status: AC
  Filled 2017-02-06: qty 1

## 2017-02-06 MED ORDER — MIDAZOLAM HCL 2 MG/2ML IJ SOLN
INTRAMUSCULAR | Status: AC
  Filled 2017-02-06: qty 2

## 2017-02-06 MED ORDER — SODIUM CHLORIDE 0.9 % IV SOLN
INTRAVENOUS | Status: DC
  Administered 2017-02-06 (×2): via INTRAVENOUS

## 2017-02-06 MED ORDER — FAMOTIDINE 20 MG PO TABS
20.0000 mg | ORAL_TABLET | Freq: Once | ORAL | Status: AC
  Administered 2017-02-06: 20 mg via ORAL

## 2017-02-06 MED ORDER — DEXAMETHASONE SODIUM PHOSPHATE 10 MG/ML IJ SOLN
INTRAMUSCULAR | Status: AC
  Filled 2017-02-06: qty 1

## 2017-02-06 MED ORDER — GLYCOPYRROLATE 0.2 MG/ML IJ SOLN
INTRAMUSCULAR | Status: AC
  Filled 2017-02-06: qty 1

## 2017-02-06 MED ORDER — FAMOTIDINE 20 MG PO TABS
ORAL_TABLET | ORAL | Status: AC
  Administered 2017-02-06: 20 mg via ORAL
  Filled 2017-02-06: qty 1

## 2017-02-06 MED ORDER — LACTATED RINGERS IV SOLN: INTRAVENOUS | Status: DC

## 2017-02-06 MED ORDER — PROPOFOL 10 MG/ML IV BOLUS
INTRAVENOUS | Status: AC
  Filled 2017-02-06: qty 20

## 2017-02-06 SURGICAL SUPPLY — 15 items
BAG INFUSER PRESSURE 100CC (MISCELLANEOUS) ×3 IMPLANT
CATH ROBINSON RED A/P 16FR (CATHETERS) ×3 IMPLANT
GLOVE BIO SURGEON STRL SZ 6.5 (GLOVE) ×2 IMPLANT
GLOVE BIO SURGEONS STRL SZ 6.5 (GLOVE) ×1
GLOVE INDICATOR 7.0 STRL GRN (GLOVE) ×3 IMPLANT
GOWN STRL REUS W/ TWL LRG LVL3 (GOWN DISPOSABLE) ×2 IMPLANT
GOWN STRL REUS W/TWL LRG LVL3 (GOWN DISPOSABLE) ×4
IV LACTATED RINGERS 1000ML (IV SOLUTION) ×3 IMPLANT
KIT RM TURNOVER CYSTO AR (KITS) ×3 IMPLANT
PACK DNC HYST (MISCELLANEOUS) ×3 IMPLANT
PAD OB MATERNITY 4.3X12.25 (PERSONAL CARE ITEMS) ×3 IMPLANT
PAD PREP 24X41 OB/GYN DISP (PERSONAL CARE ITEMS) ×3 IMPLANT
SOL PREP PVP 2OZ (MISCELLANEOUS) ×3
SOLUTION PREP PVP 2OZ (MISCELLANEOUS) ×1 IMPLANT
SURGILUBE 2OZ TUBE FLIPTOP (MISCELLANEOUS) ×3 IMPLANT

## 2017-02-06 NOTE — Anesthesia Postprocedure Evaluation (Signed)
Anesthesia Post Note  Patient: [name]  Procedure(s) Performed: Procedure(s) (LRB): DILATATION AND CURETTAGE /HYSTEROSCOPY (N/A)  Patient location during evaluation: PACU Anesthesia Type: General Level of consciousness: awake and alert Pain management: pain level controlled Vital Signs Assessment: post-procedure vital signs reviewed and stable Respiratory status: spontaneous breathing, nonlabored ventilation, respiratory function stable and patient connected to nasal cannula oxygen Cardiovascular status: blood pressure returned to baseline and stable Postop Assessment: no apparent nausea or vomiting Anesthetic complications: no     Last Vitals:  Vitals:   02/06/17 1557 02/06/17 1625  BP: 123/83 139/73  Pulse: 92 92  Resp: 18 18  Temp: (!) 36 C   SpO2: 97% 96%    Last Pain:  Vitals:   02/06/17 1557  TempSrc: Temporal  PainSc: 0-No pain                 Jordyn Hofacker S

## 2017-02-06 NOTE — Anesthesia Preprocedure Evaluation (Signed)
Anesthesia Evaluation  Patient identified by MRN, date of birth, ID band Patient awake    Reviewed: Allergy & Precautions, NPO status , Patient's Chart, lab work & pertinent test results  Airway Mallampati: II       Dental  (+) Upper Dentures, Lower Dentures   Pulmonary shortness of breath and with exertion,     + decreased breath sounds      Cardiovascular Exercise Tolerance: Poor hypertension, Pt. on medications and Pt. on home beta blockers + Past MI   Rhythm:Regular Rate:Normal     Neuro/Psych negative psych ROS   GI/Hepatic Neg liver ROS, hiatal hernia, GERD  Medicated,  Endo/Other  diabetes, Type 2, Oral Hypoglycemic Agents  Renal/GU negative Renal ROS  negative genitourinary   Musculoskeletal   Abdominal (+) + obese,   Peds negative pediatric ROS (+)  Hematology  (+) anemia ,   Anesthesia Other Findings   Reproductive/Obstetrics                             Anesthesia Physical Anesthesia Plan  ASA: III  Anesthesia Plan: General   Post-op Pain Management:    Induction: Intravenous  PONV Risk Score and Plan:   Airway Management Planned: Oral ETT  Additional Equipment:   Intra-op Plan:   Post-operative Plan: Extubation in OR  Informed Consent: I have reviewed the patients History and Physical, chart, labs and discussed the procedure including the risks, benefits and alternatives for the proposed anesthesia with the patient or authorized representative who has indicated his/her understanding and acceptance.     Plan Discussed with: CRNA  Anesthesia Plan Comments:         Anesthesia Quick Evaluation

## 2017-02-06 NOTE — Op Note (Signed)
Procedure(s): DILATATION AND CURETTAGE /HYSTEROSCOPY Procedure Note  [name] female 66 y.o. 02/06/2017  Indications: The patient is a 66 y.o. G43P1021 female with PMH of DM II, morbid obesity, breast and colon cancer, with PMB, and abnormal office endometrial biopsy with cells suspicious for endometrial mucinous adenocarcinoma  Pre-operative Diagnosis: morbid obesity, h/o breast and colon cancer, with PMB and abnormal endometrial biopsy  Post-operative Diagnosis: Same  Surgeon: Rubie Maid, MD  Assistants: None  Anesthesia: General endotracheal anesthesia  Findings: The uterus was sounded to 7.5 cm. Normal appearing endometrial cavity, with atrophic endometrium present.  Small submucosal fibroid on posterior wall of uterus.   Procedure Details: The patient was seen in the Holding Room. The risks, benefits, complications, treatment options, and expected outcomes were discussed with the patient.  The patient concurred with the proposed plan, giving informed consent.  The site of surgery properly noted/marked. The patient was taken to the Operating Room, identified as [name] and the procedure verified as Procedure(s) (LRB): DILATATION AND CURETTAGE /HYSTEROSCOPY (N/A). A Time Out was held and the above information confirmed.  She was then placed under general anesthesia without difficulty. She was placed in the dorsal lithotomy position, and was prepped and draped in a sterile manner.  A straight catheterization was performed. A sterile speculum was inserted into the vagina and the cervix was grasped at the anterior lip using a single-toothed tenaculum.  The uterus was sounded to 7.5 cm  Hanks dilators were used to dilate the endocervical canal. The 5-mmhysteroscope was inserted, using lactated Ringer's as irrigant was used for the hysteroscopy. The above-noted findings were photo documented. The hysteroscope removed. The smooth and serrated curettes were then used to curettage  the endometrial cavity with production of scant tissue.   Procedure was then terminated with all instrumentation being removed from the vagina. The patient was then awakened mobilized and taken to the recovery room in satisfactory condition.  Estimated Blood Loss:  minimal      Drains: straight catheterization prior to procedure with 200 ml of clear urine         Total IV Fluids: 600 ml  Specimens: Endometrial curettings         Implants: None         Complications:  None; patient tolerated the procedure well.         Disposition: PACU - hemodynamically stable.         Condition: stable   Rubie Maid, MD [facility]

## 2017-02-06 NOTE — Discharge Instructions (Signed)
Hysteroscopy, Care After Refer to this sheet in the next few weeks. These instructions provide you with information on caring for yourself after your procedure. Your health care provider may also give you more specific instructions. Your treatment has been planned according to current medical practices, but problems sometimes occur. Call your health care provider if you have any problems or questions after your procedure. What can I expect after the procedure? After your procedure, it is typical to have the following:  You may have some cramping. This normally lasts for a couple days.  You may have bleeding. This can vary from light spotting for a few days to menstrual-like bleeding for 3-7 days.  Follow these instructions at home:  Rest for the first 1-2 days after the procedure.  Only take over-the-counter or prescription medicines as directed by your health care provider. Do not take aspirin. It can increase the chances of bleeding.  Take showers instead of baths for 2 weeks or as directed by your health care provider.  Do not drive for 24 hours or as directed.  Do not drink alcohol while taking pain medicine.  Do not use tampons, douche, or have sexual intercourse for 2 weeks or until your health care provider says it is okay.  Take your temperature twice a day for 4-5 days. Write it down each time.  Follow your health care provider's advice about diet, exercise, and lifting.  If you develop constipation, you may: ? Take a mild laxative if your health care provider approves. ? Add bran foods to your diet. ? Drink enough fluids to keep your urine clear or pale yellow.  Try to have someone with you or available to you for the first 24-48 hours, especially if you were given a general anesthetic.  Follow up with your health care provider as directed. Contact a health care provider if:  You feel dizzy or lightheaded.  You feel sick to your stomach (nauseous).  You have  abnormal vaginal discharge.  You have a rash.  You have pain that is not controlled with medicine. Get help right away if:  You have bleeding that is heavier than a normal menstrual period.  You have a fever.  You have increasing cramps or pain, not controlled with medicine.  You have new belly (abdominal) pain.  You pass out.  You have pain in the tops of your shoulders (shoulder strap areas).  You have shortness of breath. This information is not intended to replace advice given to you by your health care provider. Make sure you discuss any questions you have with your health care provider. Document Released: 02/27/2013 Document Revised: 10/15/2015 Document Reviewed: 12/06/2012 Elsevier Interactive Patient Education  2017 [place] Anesthesia, Adult, Care After These instructions provide you with information about caring for yourself after your procedure. Your health care provider may also give you more specific instructions. Your treatment has been planned according to current medical practices, but problems sometimes occur. Call your health care provider if you have any problems or questions after your procedure. What can I expect after the procedure? After the procedure, it is common to have:  Vomiting.  A sore throat.  Mental slowness.  It is common to feel:  Nauseous.  Cold or shivery.  Sleepy.  Tired.  Sore or achy, even in parts of your body where you did not have surgery.  Follow these instructions at home: For at least 24 hours after the procedure:  Do not: ? Participate in  activities where you could fall or become injured. ? Drive. ? Use heavy machinery. ? Drink alcohol. ? Take sleeping pills or medicines that cause drowsiness. ? Make important decisions or sign legal documents. ? Take care of children on your own.  Rest. Eating and drinking  If you vomit, drink water, juice, or soup when you can drink without vomiting.  Drink  enough fluid to keep your urine clear or pale yellow.  Make sure you have little or no nausea before eating solid foods.  Follow the diet recommended by your health care provider. General instructions  Have a responsible adult stay with you until you are awake and alert.  Return to your normal activities as told by your health care provider. Ask your health care provider what activities are safe for you.  Take over-the-counter and prescription medicines only as told by your health care provider.  If you smoke, do not smoke without supervision.  Keep all follow-up visits as told by your health care provider. This is important. Contact a health care provider if:  You continue to have nausea or vomiting at home, and medicines are not helpful.  You cannot drink fluids or start eating again.  You cannot urinate after 8-12 hours.  You develop a skin rash.  You have fever.  You have increasing redness at the site of your procedure. Get help right away if:  You have difficulty breathing.  You have chest pain.  You have unexpected bleeding.  You feel that you are having a life-threatening or urgent problem. This information is not intended to replace advice given to you by your health care provider. Make sure you discuss any questions you have with your health care provider. Document Released: 08/15/2000 Document Revised: 10/12/2015 Document Reviewed: 04/23/2015 Elsevier Interactive Patient Education  [employer].

## 2017-02-06 NOTE — H&P (Signed)
UPDATE TO PREVIOUS HISTORY AND PHYSICAL  The patient has been seen and examined.  H&P is up to date, no changes noted. [name] is a 66 y.o. [phone] female scheduled for Hysteroscopy D&C. Indications for procedure are abnormal endometrial biopsy with cells suspicious for endometrial mucinous adenocarcinoma. Of note, patient has a past history of breast and colon cancer.  All questions from patient and family member answered. Patient can proceed to the OR for scheduled procedure.   Rubie Maid, MD 02/06/2017 2:25 PM

## 2017-02-06 NOTE — Anesthesia Procedure Notes (Signed)
Procedure Name: Intubation Date/Time: 02/06/2017 2:35 PM Performed by: Doreen Salvage Pre-anesthesia Checklist: Patient identified, Patient being monitored, Timeout performed, Emergency Drugs available and Suction available Patient Re-evaluated:Patient Re-evaluated prior to induction Oxygen Delivery Method: Circle system utilized Preoxygenation: Pre-oxygenation with 100% oxygen Induction Type: IV induction Ventilation: Mask ventilation without difficulty Laryngoscope Size: Mac and 3 Grade View: Grade I Tube type: Oral Tube size: 7.0 mm Number of attempts: 1 Airway Equipment and Method: Stylet Placement Confirmation: ETT inserted through vocal cords under direct vision,  positive ETCO2 and breath sounds checked- equal and bilateral Secured at: 21 cm Tube secured with: Tape Dental Injury: Teeth and Oropharynx as per pre-operative assessment

## 2017-02-06 NOTE — Anesthesia Post-op Follow-up Note (Signed)
Anesthesia QCDR form completed.        

## 2017-02-06 NOTE — Transfer of Care (Signed)
Immediate Anesthesia Transfer of Care Note  Patient: [name]  Procedure(s) Performed: Procedure(s): DILATATION AND CURETTAGE /HYSTEROSCOPY (N/A)  Patient Location: PACU  Anesthesia Type:General  Level of Consciousness: sedated  Airway & Oxygen Therapy: Patient Spontanous Breathing and Patient connected to face mask oxygen  Post-op Assessment: Report given to RN and Post -op Vital signs reviewed and stable  Post vital signs: Reviewed and stable  Last Vitals:  Vitals:   02/06/17 1316 02/06/17 1518  BP: (!) 149/78 139/78  Pulse: (!) 107 91  Resp: 20 14  Temp: 37.6 C (!) 36.2 C  SpO2: 977% 414%    Complications: No apparent anesthesia complications

## 2017-02-09 LAB — SURGICAL PATHOLOGY

## 2017-02-15 ENCOUNTER — Ambulatory Visit (INDEPENDENT_AMBULATORY_CARE_PROVIDER_SITE_OTHER): Payer: Medicare Other | Admitting: Obstetrics and Gynecology

## 2017-02-15 VITALS — BP 146/65 | HR 87 | Ht 67.0 in | Wt 318.0 lb

## 2017-02-15 DIAGNOSIS — Z4889 Encounter for other specified surgical aftercare: Secondary | ICD-10-CM

## 2017-02-15 DIAGNOSIS — N950 Postmenopausal bleeding: Secondary | ICD-10-CM | POA: Diagnosis not present

## 2017-02-15 DIAGNOSIS — R879 Unspecified abnormal finding in specimens from female genital organs: Secondary | ICD-10-CM

## 2017-02-15 NOTE — Progress Notes (Deleted)
    GYNECOLOGY PROGRESS NOTE  Subjective:    Patient ID: DAISHIA FETTERLY, female    DOB: 10-May-1951, 66 y.o.   MRN: [id number]  HPI  Patient is a 65 y.o. G83P1021 female who presents for   {Common ambulatory SmartLinks:19316}  Review of Systems {ros; complete:30496}   Objective:   Blood pressure (!) 146/65, pulse 87, height 5\' 7"  (1.702 m), weight (!) 318 lb (144.2 kg). General appearance: {general exam:16600} Abdomen: {abdominal exam:16834} Pelvic: {pelvic exam:16852::"cervix normal in appearance","external genitalia normal","no adnexal masses or tenderness","no cervical motion tenderness","rectovaginal septum normal","uterus normal size, shape, and consistency","vagina normal without discharge"} Extremities: {extremity exam:5109} Neurologic: {neuro exam:17854}   Assessment:    Plan:

## 2017-02-16 NOTE — Progress Notes (Signed)
    OBSTETRICS/GYNECOLOGY POST-OPERATIVE CLINIC VISIT  Subjective:     [name] is a 66 y.o. female who presents to the clinic 10 days status post Hysteroscopy D&C for PMB and abnormal in office endometrial biopsy suggestive of malignancy. She denies complaints today. Eating a regular diet without difficulty. Bowel movements are normal. The patient is not having any pain.  The patient denies any bleeding.   The following portions of the patient's history were reviewed and updated as appropriate: allergies, current medications, past family history, past medical history, past social history, past surgical history and problem list.  Review of Systems Pertinent items noted in HPI and remainder of comprehensive ROS otherwise negative.    Objective:    BP (!) 146/65 (BP Location: Left Arm, Patient Position: Sitting, Cuff Size: Large)   Pulse 87   Ht 5\' 7"  (1.702 m)   Wt (!) 318 lb (144.2 kg)   BMI 49.81 kg/m  General:  alert and no distress  Abdomen: soft, bowel sounds active, non-tender  Pelvis:   deferred    Pathology:  A. ENDOMETRIUM; CURETTAGE:  - COMPLEX EPITHELIAL METAPLASIA INVOLVING AREAS OF ENDOMETRIAL BREAKDOWN  AND A POLYPOID FRAGMENT, SEE COMMENT.   Assessment:    Doing well postoperatively.  PMB  Plan:   1. Patient asks if she can resume all home meds.  Advised to do so.  2. Operative findings again reviewed. Pathology report discussed. Due to discrepancy in findings between [facility] and in office endometrial biopsy will refer to GYN Oncology on final decision regarding management options for PMB.  4. Activity restrictions: none 5. Anticipated return to work: not applicable. 6. Follow up: once management determined by GYN Oncology as follow up deemed necessary. Can also f/u yearly for annual exams.     Rubie Maid, MD [facility]

## 2017-02-18 IMAGING — MR MRI HEAD WITHOUT CONTRAST
9 of 10 series · 40 of 48 positions shown · non-contrast
Comparison: Head CT without contrast 09/15/2014.

CLINICAL DATA: 64-year-old female with syncope yesterday.
Generalized weakness. Initial encounter.

EXAM:
MRI HEAD WITHOUT CONTRAST
TECHNIQUE: Multiplanar, multiecho pulse sequences of the brain and surrounding
structures were obtained without intravenous contrast.

[Series 2: T1 · sagittal · 5.0mm · 0.45mm/px · 3 of 28 slices shown]
[im 1/28]
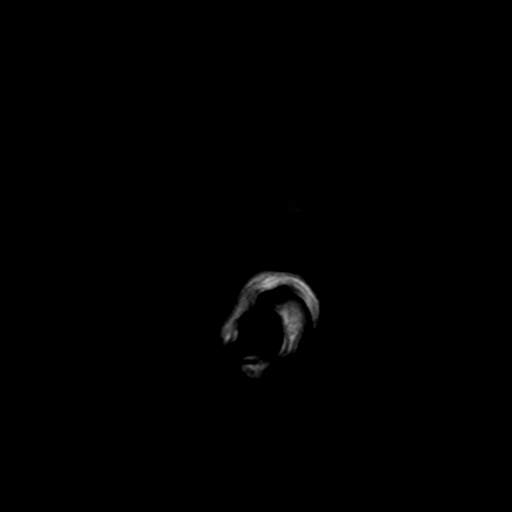
[im 14/28]
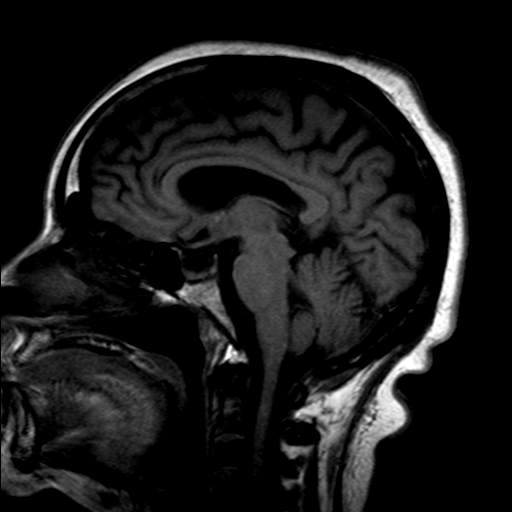
[im 28/28]
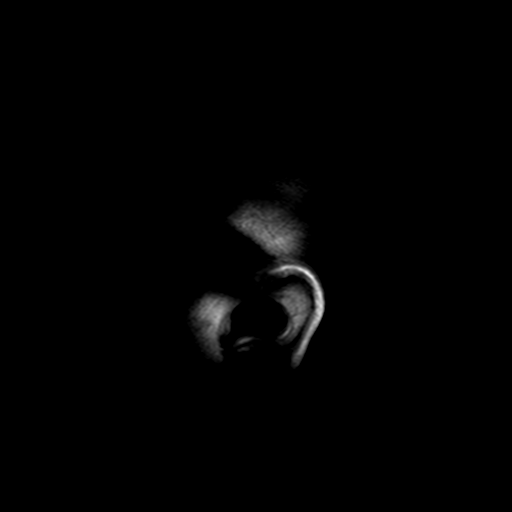

[Series 4: DWI · axial · 4.0mm · 0.94mm/px · z∈[-93,+80]mm · 5 of 45 slices shown (1 of 4)]
[im 1/45]
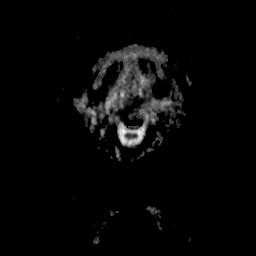
[im 12/45]
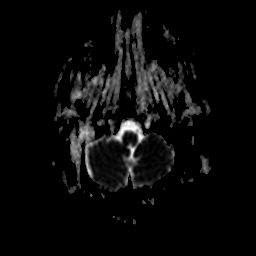
[im 23/45]
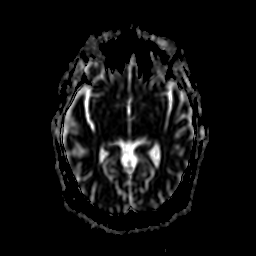
[im 34/45]
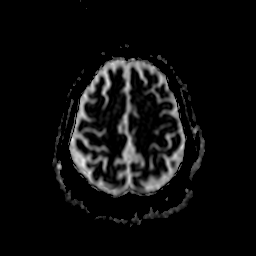
[im 45/45]
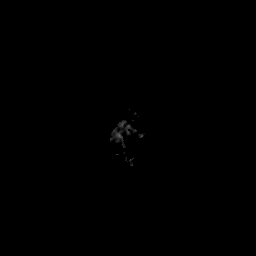

[Series 5: DWI · axial · 4.0mm · 0.94mm/px · z∈[-85,+80]mm · 6 of 42 slices shown (2 of 4)]
[im 1/42]
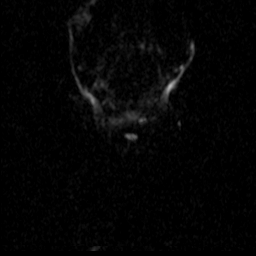
[im 9/42]
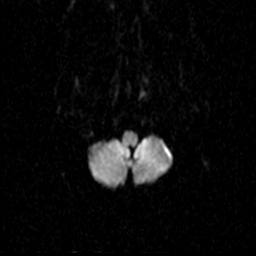
[im 17/42]
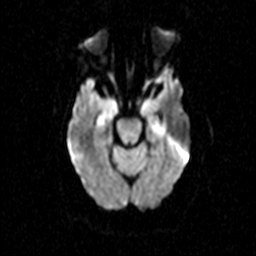
[im 25/42]
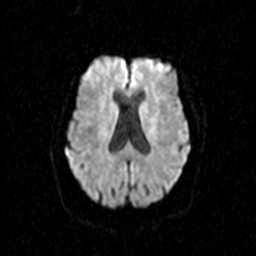
[im 33/42]
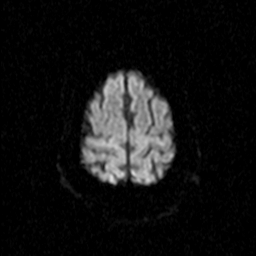
[im 42/42]
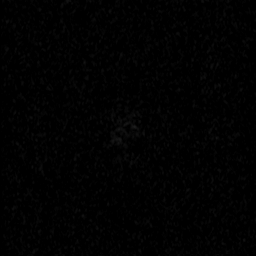

[Series 7: DWI · coronal · 5.0mm · 1.80mm/px · 5 of 38 slices shown (3 of 4)]
[im 1/38]
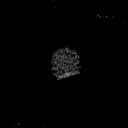
[im 10/38]
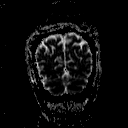
[im 19/38]
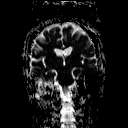
[im 28/38]
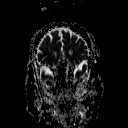
[im 38/38]
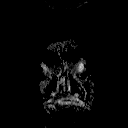

[Series 8: DWI · coronal · 5.0mm · 1.80mm/px · 5 of 38 slices shown (4 of 4)]
[im 1/38]
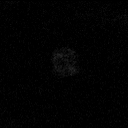
[im 10/38]
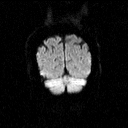
[im 19/38]
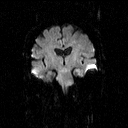
[im 28/38]
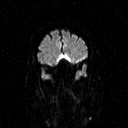
[im 38/38]
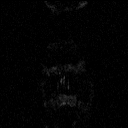

[Series 9: T2 · axial · 5.0mm · 0.45mm/px · z∈[-90,+77]mm · 4 of 27 slices shown (1 of 3)]
[im 1/27]
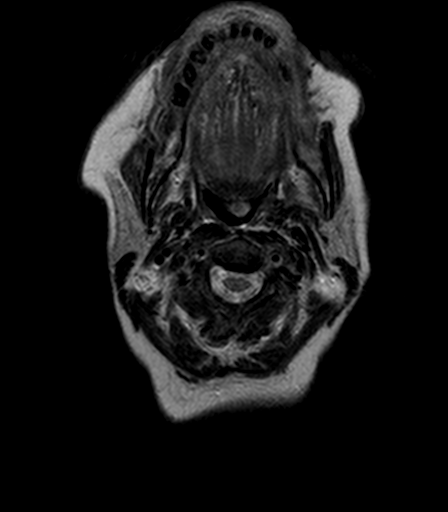
[im 9/27]
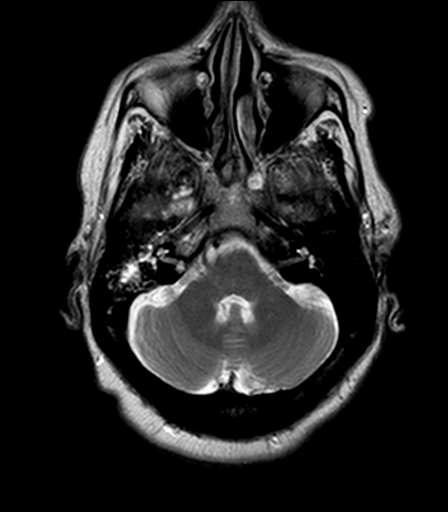
[im 18/27]
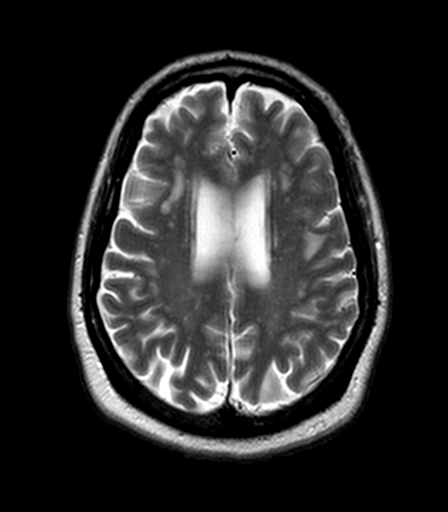
[im 27/27]
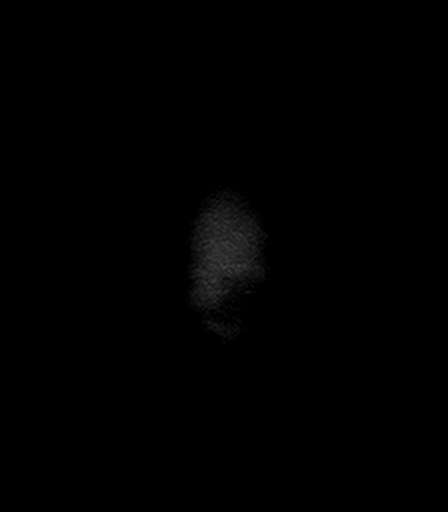

[Series 10: FLAIR · axial · 5.0mm · 0.90mm/px · z∈[-90,+77]mm · 4 of 27 slices shown]
[im 1/27]
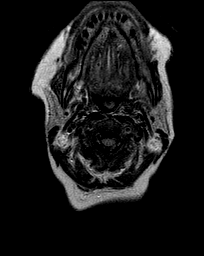
[im 9/27]
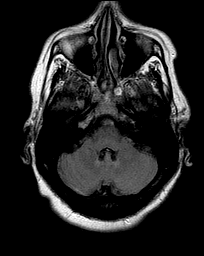
[im 18/27]
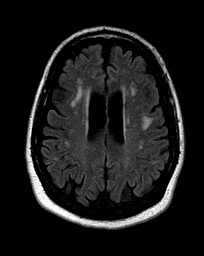
[im 27/27]
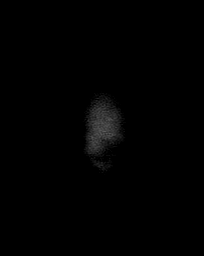

[Series 11: T2 · axial · 5.0mm · 0.45mm/px · z∈[-90,+77]mm · 4 of 27 slices shown (2 of 3)]
[im 1/27]
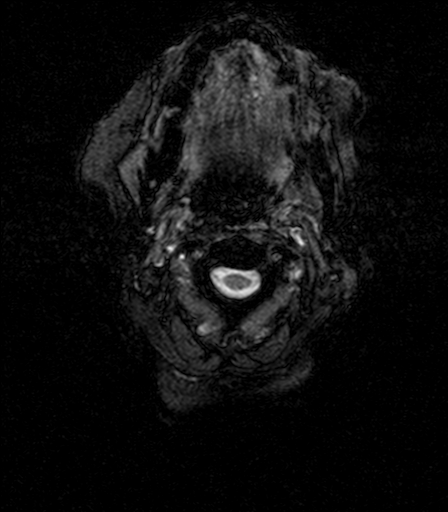
[im 9/27]
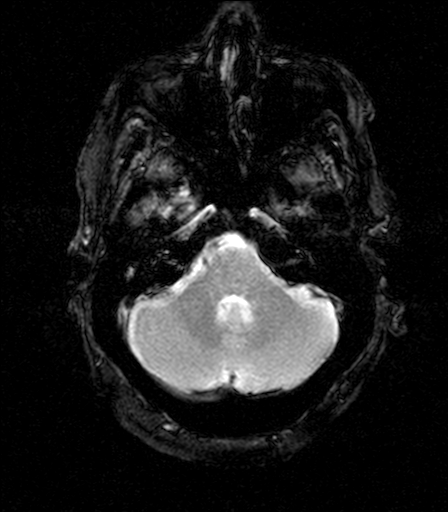
[im 18/27]
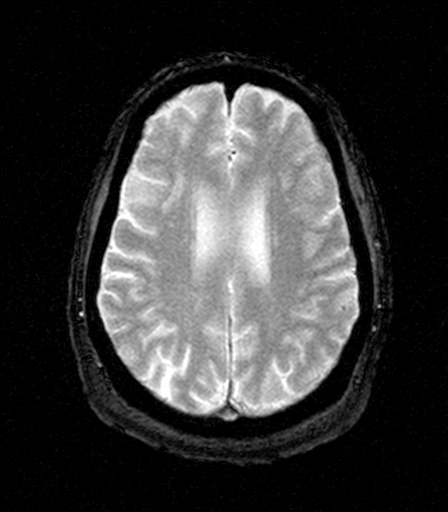
[im 27/27]
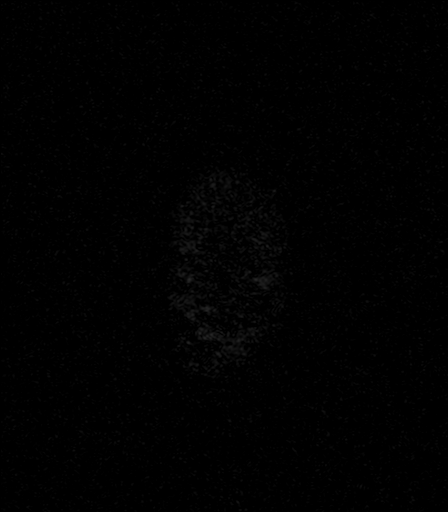

[Series 13: T2 · coronal · 5.0mm · 0.45mm/px · 4 of 30 slices shown (3 of 3)]
[im 1/30]
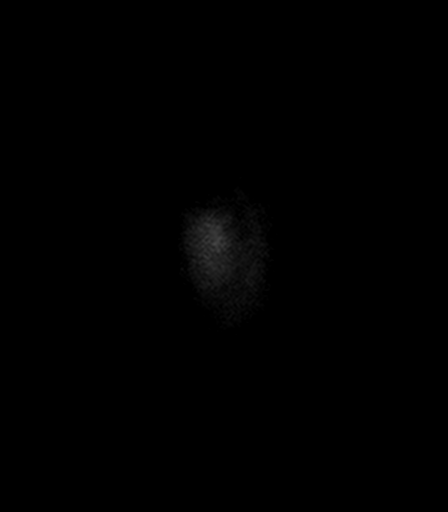
[im 10/30]
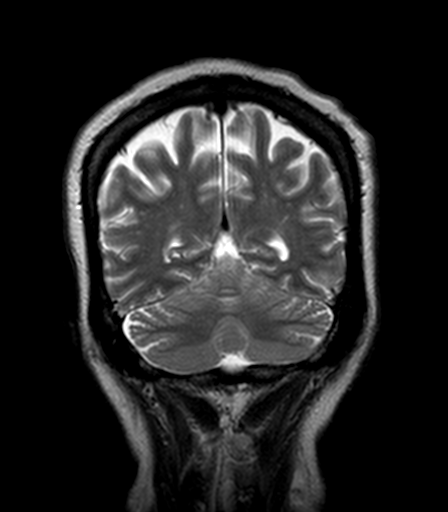
[im 20/30]
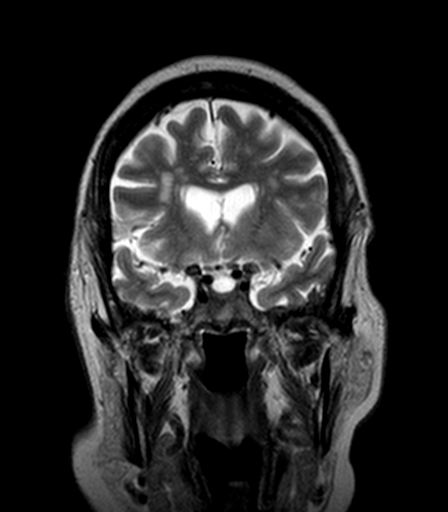
[im 30/30]
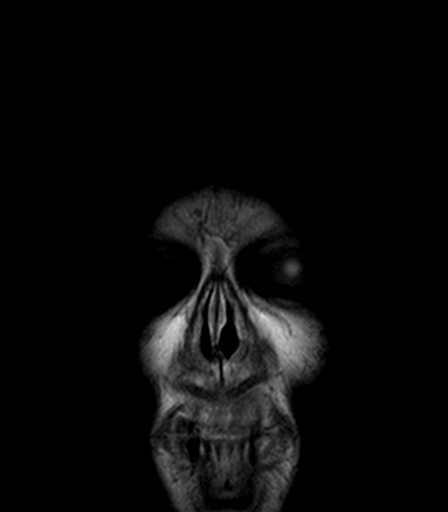

[40 of 48 positions shown; findings below may reference images not displayed]

FINDINGS: Major intracranial vascular flow voids are within normal limits. No
restricted diffusion or evidence of acute infarction.

Cerebral volume is within normal limits for age. No midline shift,
mass effect, evidence of mass lesion, ventriculomegaly, extra-axial
collection or acute intracranial hemorrhage. Cervicomedullary
junction and pituitary are within normal limits. Negative visualized
cervical spine.

Patchy and confluent cerebral white matter T2 and FLAIR
hyperintensity in a nonspecific configuration. No cortical
encephalomalacia or chronic blood products identified in the brain.
Deep gray matter nuclei, brainstem and cerebellum are within normal
limits.

Right mastoid effusion. Negative visualized pharynx. Other Visible
internal auditory structures appear normal. Trace paranasal sinus
mucosal thickening. Postoperative changes to the left globe.
Visualized scalp soft tissues are within normal limits. Nonspecific
decreased bone marrow signal in the calvarium and visible cervical
spine. Normal bone marrow signal at the skullbase.
IMPRESSION: 1.  No acute intracranial abnormality.
2. Moderate for age nonspecific cerebral white matter signal
changes, most commonly due to chronic small vessel disease.

## 2017-03-08 ENCOUNTER — Inpatient Hospital Stay: Payer: Medicare Other

## 2017-03-22 ENCOUNTER — Inpatient Hospital Stay: Payer: Medicare Other | Attending: Obstetrics and Gynecology | Admitting: Obstetrics and Gynecology

## 2017-03-22 ENCOUNTER — Ambulatory Visit: Payer: Medicare Other

## 2017-03-22 ENCOUNTER — Encounter: Payer: Self-pay | Admitting: Obstetrics and Gynecology

## 2017-03-22 VITALS — BP 146/84 | HR 80 | Temp 96.9°F | Resp 18 | Ht 67.0 in | Wt 324.0 lb

## 2017-03-22 DIAGNOSIS — E1169 Type 2 diabetes mellitus with other specified complication: Secondary | ICD-10-CM | POA: Diagnosis not present

## 2017-03-22 DIAGNOSIS — Z853 Personal history of malignant neoplasm of breast: Secondary | ICD-10-CM

## 2017-03-22 DIAGNOSIS — K219 Gastro-esophageal reflux disease without esophagitis: Secondary | ICD-10-CM | POA: Insufficient documentation

## 2017-03-22 DIAGNOSIS — E11610 Type 2 diabetes mellitus with diabetic neuropathic arthropathy: Secondary | ICD-10-CM | POA: Insufficient documentation

## 2017-03-22 DIAGNOSIS — K439 Ventral hernia without obstruction or gangrene: Secondary | ICD-10-CM | POA: Diagnosis not present

## 2017-03-22 DIAGNOSIS — Z7984 Long term (current) use of oral hypoglycemic drugs: Secondary | ICD-10-CM | POA: Diagnosis not present

## 2017-03-22 DIAGNOSIS — I10 Essential (primary) hypertension: Secondary | ICD-10-CM | POA: Insufficient documentation

## 2017-03-22 DIAGNOSIS — Z85038 Personal history of other malignant neoplasm of large intestine: Secondary | ICD-10-CM

## 2017-03-22 DIAGNOSIS — Z7982 Long term (current) use of aspirin: Secondary | ICD-10-CM | POA: Insufficient documentation

## 2017-03-22 DIAGNOSIS — E782 Mixed hyperlipidemia: Secondary | ICD-10-CM | POA: Insufficient documentation

## 2017-03-22 DIAGNOSIS — C55 Malignant neoplasm of uterus, part unspecified: Secondary | ICD-10-CM | POA: Insufficient documentation

## 2017-03-22 DIAGNOSIS — Z79899 Other long term (current) drug therapy: Secondary | ICD-10-CM | POA: Diagnosis not present

## 2017-03-22 DIAGNOSIS — E11621 Type 2 diabetes mellitus with foot ulcer: Secondary | ICD-10-CM | POA: Insufficient documentation

## 2017-03-22 DIAGNOSIS — C549 Malignant neoplasm of corpus uteri, unspecified: Secondary | ICD-10-CM

## 2017-03-22 NOTE — Progress Notes (Signed)
Gynecologic Oncology Consult Visit   Referring Provider: Dr Marcelline Mates  Chief Concern: possible uterine cancer  Subjective:  [name] is a 66 y.o. female [id number] who is seen in consultation from Dr. Marcelline Mates for possible uterine cancer.  She has not had any recent bleeding.    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   Comment:  Additional deeper sections were reviewed. The total amount of tissue is  relatively small. Several small fragments of metaplastic epithelium are  present, with mixed features including mucinous, tubal, papillary  syncytial, and eosinophilic metaplasia. Some fragments consist of  metaplastic epithelium surfacing endometrial stroma with breakdown  changes and acute inflammation. Much of the metaplastic epithelium is  associated with a polypoid endometrial fragment. Some of the fragments  show moderate glandular architectural complexity, but there is no  significant cytologic atypia.   The previous endometrial biopsy was reviewed ([id number], taken  12/07/16). In comparison, the curettage specimen contains fragments with  a more varied cellular composition and identifiable stromal breakdown.  The degenerated material seen in the biopsy is not present in the  curettings.   It is possible that the metaplastic changes are limited to a  degenerating endometrial polyp, but in such fragmented samples it cannot  be determined if this is a localized or diffuse lesion. Close follow-up  with repeat sampling is advised, at a minimum, to be sure there is no  associated neoplasm.   Oncology history Patient presented with a few days of postmenopausal bleeding and endometrial biopsy with Dr Marcelline Mates showed suspicion for mucinous cancer (see below).   PAP smear normal.  Not bleeding now.  History of breast cancer about 25 years ago  and colon cancer about 7 years ago treated by colectomy, and morbid obesity and Type II DM.  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   COMMENT: THE BIOPSY IS SCANT, RECOMMEND ADDITIONAL BIOPSY FOR BETTER  EVALUATION. THIS CASE WAS REVIEWED IN INTRADEPARTMENTAL CONSULTATION AND  THE DIAGNOSIS REFLECTS OUR CONSENSUS OPINION.   - Patient up to date on colonoscopy (performed last year 08/2015, with internal hemorrhoids and benign polyps noted), but is due for a mammogram. Last mammogram normal 10/2015.    Problem List: Patient Active Problem List   Diagnosis Date Noted  . 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[place]) 12/24/2016  . Diabetic foot ulcer ([place]) 12/24/2016  . Electrocardiogram abnormal 12/24/2016  . Hypercholesterolemia 12/24/2016  . Hypomagnesemia 12/24/2016  . Joint pain 12/24/2016  . Morbid obesity ([place]) 12/24/2016  . 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 Ventral incisional hernia 12/24/2016  . Diabetes mellitus ([place]) 11/27/2013  . Essential hypertension 11/27/2013  . Mixed hyperlipidemia 11/27/2013    Past Medical History: Past Medical History:  Diagnosis Date  . Breast cancer ([place])    left breast mastectomy  . Breast cancer ([place])   . Carpal tunnel syndrome    Bilateral  . 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[place])   . Colon cancer ([place])   . Dysrhythmia   . Edema    FEET AND LEGS  . GERD (gastroesophageal reflux disease)   . Heart murmur   .  History of hiatal hernia   . History of tachycardia   . Hypertension   . Iron deficiency anemia   . Morbid obesity ([place])   . 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 Osteoporosis,  post-menopausal   . Shortness of breath dyspnea   . Type 2 diabetes mellitus without complication ([place])   . Valvular heart disease     Past Surgical History: Past Surgical History:  Procedure Laterality Date  . BREAST REDUCTION SURGERY Right   . BREAST SURGERY    . CATARACT EXTRACTION W/PHACO Right 01/26/2016   Procedure: CATARACT EXTRACTION PHACO AND INTRAOCULAR LENS PLACEMENT (IOC);  Surgeon: Birder Robson, MD;  Location: [facility] ORS;  Service: Ophthalmology;  Laterality: Right;  [place]  00:26 AP% 21.5 CDE 5.70 Fluid pack lot # [id number]  . COLON SURGERY     RESECTION  . COLONOSCOPY    . COLONOSCOPY WITH PROPOFOL N/A 09/07/2015   Procedure: COLONOSCOPY WITH PROPOFOL;  Surgeon: Manya Silvas, MD;  Location: [facility];  Service: Endoscopy;  Laterality: N/A;  . DILATION AND CURETTAGE OF UTERUS    . ESOPHAGOGASTRODUODENOSCOPY    . EYE SURGERY Bilateral    Cataract Extraction with IOL  . FOOT AMPUTATION Right    heel  . HYSTEROSCOPY W/D&C N/A 02/06/2017   Procedure: DILATATION AND CURETTAGE /HYSTEROSCOPY;  Surgeon: Rubie Maid, MD;  Location: [facility] ORS;  Service: Gynecology;  Laterality: N/A;  . INCISION AND DRAINAGE Bilateral    FOOT LESIONS  . KNEE ARTHROSCOPY Left   . KNEE ARTHROSCOPY Left   . LAPAROSCOPIC RIGHT COLECTOMY  2002  . MASTECTOMY Left 1992   Left mastectomy with right breast reduction for breast cancer  . OTHER SURGICAL HISTORY     Gastric Sleeve  . TONSILLECTOMY       OB History:  OB History  Gravida Para Term Preterm AB Living  3 1 1   2 1   SAB TAB Ectopic Multiple Live Births  2       1    # Outcome Date GA Lbr Len/2nd Weight Sex Delivery Anes PTL Lv  3 Term 49    M    LIV  2 SAB           1 SAB               Family History: History reviewed. No pertinent family history.  Social History: Social History   Social History  . Marital status: Legally Separated    Spouse name: N/A  . Number of children: N/A  . Years of education: N/A    Occupational History  . Not on file.   Social History Main Topics  . Smoking status: Never Smoker  . Smokeless tobacco: Never Used  . Alcohol use No  . Drug use: No  . Sexual activity: Not Currently    Birth control/ protection: None   Other Topics Concern  . Not on file   Social History Narrative  . No narrative on file    Allergies: Allergies  Allergen Reactions  . Codeine Phosphate [Codeine]     GI upset  . Penicillins     GI upset    Current Medications: Current Outpatient Prescriptions  Medication Sig Dispense Refill  . acetaminophen (TYLENOL) 500 MG tablet Take 1,000 mg by mouth 2 (two) times daily.     Marland Kitchen amitriptyline (ELAVIL) 50 MG tablet Take 50 mg by mouth at bedtime.    Marland Kitchen  amLODipine (NORVASC) 10 MG tablet Take 10 mg by mouth daily.    Marland Kitchen aspirin 81 MG tablet Take 81 mg by mouth daily.    . Calcium 600-200 MG-UNIT tablet Take 1 tablet by mouth 2 (two) times daily.    Marland Kitchen docusate sodium (COLACE) 100 MG capsule Take 200 mg by mouth daily.    . fluticasone (FLONASE) 50 MCG/ACT nasal spray Place 2 sprays into both nostrils daily.    Marland Kitchen gabapentin (NEURONTIN) 600 MG tablet Take by mouth 3 (three) times daily.     . Infant Care Products [facility] EX) Apply topically 2 times a day when needed for rash    . loratadine (CLARITIN) 10 MG tablet Take 10 mg by mouth daily.    Marland Kitchen lovastatin (MEVACOR) 40 MG tablet Take 80 mg by mouth at bedtime.    . magnesium hydroxide (MILK OF MAGNESIA) 400 MG/5ML suspension Take 30 mLs by mouth daily as needed (constipation).     . meloxicam (MOBIC) 15 MG tablet Take 15 mg by mouth daily.     . Menthol, Topical Analgesic, (BIOFREEZE) 4 % GEL Apply 1 application topically 3 (three) times daily as needed (joint pain).     . metFORMIN (GLUCOPHAGE) 1000 MG tablet Take 1,000 mg by mouth 2 (two) times daily with a meal.    . metoprolol (LOPRESSOR) 100 MG tablet Take 100 mg by mouth 2 (two) times daily.     Marland Kitchen omeprazole (PRILOSEC) 20 MG capsule  Take 20 mg by mouth daily.    Marland Kitchen oxybutynin (DITROPAN XL) 15 MG 24 hr tablet Take 15 mg by mouth at bedtime.    . polyethylene glycol (MIRALAX / GLYCOLAX) packet Take 17 g by mouth every Monday, Wednesday, and Friday.    . polyvinyl alcohol (LIQUIFILM TEARS) 1.4 % ophthalmic solution Place 1 drop into both eyes 2 (two) times daily as needed for dry eyes.    Marland Kitchen senna-docusate (SENOKOT-S) 8.6-50 MG tablet Take 1 tablet by mouth 2 (two) times daily.     . Throat Lozenges (MENTHOL COUGH DROPS MT) Use as directed 1 lozenge in the mouth or throat every 2 (two) hours as needed (cough).      No current facility-administered medications for this visit.     Review of Systems Per HPI   Objective:  Physical Examination:  BP (!) 146/84   Pulse 80   Temp (!) 96.9 F (36.1 C) (Tympanic)   Resp 18   Ht 5\' 7"  (1.702 m)   Wt (!) 324 lb (147 kg)   BMI 50.75 kg/m    ECOG Performance Status: 1 - Symptomatic but completely ambulatory  General appearance: alert, cooperative and appears stated age HEENT:PERRLA, extra ocular movement intact, neck supple with midline trachea and thyroid without masses Lymph node survey: non-palpable, axillary, inguinal, supraclavicular Cardiovascular: regular rate and rhythm, no murmurs or gallops Respiratory: normal air entry, lungs clear to auscultation and no rales, rhonchi or wheezing Breast exam: not examined. Abdomen: soft, non-tender, without masses or organomegaly, no hernias and well healed incision Back: inspection of back is normal Extremities: extremities normal, atraumatic, no cyanosis or edema Skin exam - normal coloration and turgor, no rashes, no suspicious skin lesions noted. Neurological exam reveals alert, oriented, normal speech, no focal findings or movement disorder noted.  Pelvic: exam chaperoned by nurse;  Vulva: normal appearing vulva with no masses, tenderness or lesions; Vagina: normal vagina; Adnexa: normal adnexa in size, nontender and no  masses; Uterus: uterus is normal size, shape,  consistency and nontender; Cervix: no lesions; Rectal: normal rectal, no masses      Assessment:  [name] is a 66 y.o. 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  There is also some breakdown of the tissue.  Medical co-morbidities complicating care: Morbid obesity (BMI 50), Diabetes with foot ulcer and osteomyelitis, large ventral hernia, history of colon and breast cancer.   Plan:   Problem List Items Addressed This Visit    None    Visit Diagnoses    Malignant neoplasm of body of uterus, unspecified site [facility])    -  Primary     I discussed management with the patient.  Since she is not having any bleeding and in view of the benign path and her significant comorbidities, would like to avoid surgery.  Will have the pathology reviewed at [facility] to make sure they are not concerned and will let the patient know.    The patient's diagnosis, an outline of the further diagnostic and laboratory studies which will be required, the recommendation, and alternatives were discussed.  All questions were answered to the patient's satisfaction.  A total of 60 minutes were spent with the patient/family today; 40% was spent in education, counseling and coordination of care for possible uterine cancer.    Mellody Drown, MD  CC:  Denton Lank, MD [address] [place], [place] [address] [phone]

## 2017-03-24 NOTE — Progress Notes (Signed)
  Oncology Nurse Navigator Documentation [facility] pathology request sent with confirmation. Original sent to medical records for filing. Navigator Location: [facility] (03/24/17 1000)   )                      Patient Visit Type: GynOnc (03/24/17 1000)                              Time Spent with Patient: 15 (03/24/17 1000)

## 2017-04-27 ENCOUNTER — Telehealth: Payer: Self-pay

## 2017-04-27 NOTE — Telephone Encounter (Signed)
Notified Ms. Blann that Dr. Fransisca Connors would like to see her back in clinic in 1-2 months. Appointment arranged with her. Read back performed. Oncology Nurse Navigator Documentation  Navigator Location: [facility] (04/27/17 1600)   )Navigator Encounter Type: Telephone;Follow-up Appt (04/27/17 1600) Telephone: Outgoing Call;Appt Confirmation/Clarification (04/27/17 1600)                                                  Time Spent with Patient: 15 (04/27/17 1600)

## 2017-06-13 ENCOUNTER — Other Ambulatory Visit: Payer: Self-pay | Admitting: Family Medicine

## 2017-06-13 DIAGNOSIS — Z1231 Encounter for screening mammogram for malignant neoplasm of breast: Secondary | ICD-10-CM

## 2017-06-28 ENCOUNTER — Encounter: Payer: Self-pay | Admitting: Obstetrics and Gynecology

## 2017-06-28 ENCOUNTER — Inpatient Hospital Stay: Payer: Medicare Other | Attending: Obstetrics and Gynecology | Admitting: Obstetrics and Gynecology

## 2017-06-28 ENCOUNTER — Other Ambulatory Visit: Payer: Self-pay | Admitting: Obstetrics and Gynecology

## 2017-06-28 VITALS — BP 157/85 | HR 91 | Temp 97.6°F | Resp 18 | Ht 67.0 in | Wt 319.0 lb

## 2017-06-28 DIAGNOSIS — C55 Malignant neoplasm of uterus, part unspecified: Secondary | ICD-10-CM

## 2017-06-28 DIAGNOSIS — C549 Malignant neoplasm of corpus uteri, unspecified: Secondary | ICD-10-CM

## 2017-06-28 NOTE — Progress Notes (Signed)
No gyn concerns 

## 2017-06-28 NOTE — Progress Notes (Signed)
Chaperoned pelvic exam. Pap/biopsies sent to lab/pathology. Oncology Nurse Navigator Documentation  Navigator Location: [facility] (06/28/17 1400)   )Navigator Encounter Type: Follow-up Appt (06/28/17 1400)                     Patient Visit Type: GynOnc (06/28/17 1400)                              Time Spent with Patient: 30 (06/28/17 1400)

## 2017-06-28 NOTE — Progress Notes (Signed)
Gynecologic Oncology Consult Visit   Referring Provider: Dr. Marcelline Mates  Chief Concern: postmenopausal bleeding.  Subjective:  [name] is a 67 y.o. female [id number] who is seen in consultation from Dr. Marcelline Mates for possible uterine cancer.  She still has not had any more bleeding, discharge, pain or other Gyn symptoms.  Still lives in adult care. Comes for follow up tissue sampling today.  Oncology history Patient presented with a few days of postmenopausal bleeding 7/18 and endometrial biopsy with Dr Marcelline Mates showed suspicion for mucinous cancer (see below).   PAP smear normal.  No bleeding after that.  History of breast cancer about 25 years ago and colon cancer about 7 years ago treated by colectomy, and morbid obesity and Type II DM.  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    Pelvic US 12/02/16: Probable uterine leiomyomas, largest potentially containing fat. Abnormal thickening of the endometrial complex for a postmenopausal patient at 15 mm thick with a small amount of endometrial fluid.  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 COMMENT: THE BIOPSY IS SCANT, RECOMMEND ADDITIONAL BIOPSY FOR BETTER  EVALUATION.    02/06/17 Dr Marcelline Mates did hysteroscopy and D&C.  No concerning finding seen. 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   Comment:  Additional deeper sections were reviewed. The total amount of tissue is  relatively small. Several small fragments of metaplastic epithelium are  present, with mixed features including mucinous, tubal, papillary  syncytial, and eosinophilic metaplasia. Some fragments consist of  metaplastic epithelium surfacing endometrial stroma with  breakdown  changes and acute inflammation. Much of the metaplastic epithelium is  associated with a polypoid endometrial fragment. Some of the fragments  show moderate glandular architectural complexity, but there is no  significant cytologic atypia. The previous endometrial biopsy was reviewed ([id number], taken  12/07/16). In comparison, the curettage specimen contains fragments with  a more varied cellular composition and identifiable stromal breakdown.  The degenerated material seen in the biopsy is not present in the  curettings. It is possible that the metaplastic changes are limited to a  degenerating endometrial polyp, but in such fragmented samples it cannot  be determined if this is a localized or diffuse lesion. Close follow-up  with repeat sampling is advised, at a minimum, to be sure there is no  associated neoplasm.   Path from [facility] was reviewed at [facility] and there were reported a few fragments of mucinous glands with no high grade atypia and only rare mitoses.  This could be adenocarcinoma vs reactive/metaplastic changes.    In view of her medical comorbidities, lack of symptoms and no definitive cancer diagnosis, it was decided not to do hysterectomy, rather to repeat tissue sampling in 3 months.   - Patient up to date on colonoscopy (performed last year 08/2015, with internal hemorrhoids and benign polyps noted).  Problem List: Patient Active Problem List   Diagnosis Date Noted  . 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[place]) 12/24/2016  . Diabetic foot ulcer ([place]) 12/24/2016  . Electrocardiogram abnormal 12/24/2016  . Hypercholesterolemia 12/24/2016  . Hypomagnesemia 12/24/2016  . Joint pain 12/24/2016  . Morbid obesity ([place]) 12/24/2016  . 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 Ventral incisional hernia 12/24/2016  . Diabetes mellitus ([place])  11/27/2013  . Essential hypertension 11/27/2013  . Mixed hyperlipidemia 11/27/2013    Past Medical  History: Past Medical History:  Diagnosis Date  . Breast cancer ([place])    left breast mastectomy  . Breast cancer ([place])   . Carpal tunnel syndrome    Bilateral  . 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[place])   . Colon cancer ([place])   . Dysrhythmia   . Edema    FEET AND LEGS  . GERD (gastroesophageal reflux disease)   . Heart murmur   . History of hiatal hernia   . History of tachycardia   . Hypertension   . Iron deficiency anemia   . Morbid obesity ([place])   . 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 Osteoporosis, post-menopausal   . Shortness of breath dyspnea   . Type 2 diabetes mellitus without complication ([place])   . Valvular heart disease     Past Surgical History: Past Surgical History:  Procedure Laterality Date  . BREAST REDUCTION SURGERY Right   . BREAST SURGERY    . CATARACT EXTRACTION W/PHACO Right 01/26/2016   Procedure: CATARACT EXTRACTION PHACO AND INTRAOCULAR LENS PLACEMENT (IOC);  Surgeon: Birder Robson, MD;  Location: [facility] ORS;  Service: Ophthalmology;  Laterality: Right;  [place]  00:26 AP% 21.5 CDE 5.70 Fluid pack lot # [id number]  . COLON SURGERY     RESECTION  . COLONOSCOPY    . COLONOSCOPY WITH PROPOFOL N/A 09/07/2015   Procedure: COLONOSCOPY WITH PROPOFOL;  Surgeon: Manya Silvas, MD;  Location: [facility] ENDOSCOPY;  Service: Endoscopy;  Laterality: N/A;  . DILATION AND CURETTAGE OF UTERUS    . ESOPHAGOGASTRODUODENOSCOPY    . EYE SURGERY Bilateral    Cataract Extraction with IOL  . FOOT AMPUTATION Right    heel  . HYSTEROSCOPY W/D&C N/A 02/06/2017   Procedure: DILATATION AND CURETTAGE /HYSTEROSCOPY;  Surgeon: Rubie Maid, MD;  Location: [facility] ORS;  Service: Gynecology;  Laterality: N/A;  . INCISION AND DRAINAGE Bilateral    FOOT LESIONS  . KNEE ARTHROSCOPY Left   . KNEE ARTHROSCOPY Left   . LAPAROSCOPIC RIGHT COLECTOMY  2002  . MASTECTOMY Left 1992   Left mastectomy with right breast reduction  for breast cancer  . OTHER SURGICAL HISTORY     Gastric Sleeve  . TONSILLECTOMY       OB History:  OB History  Gravida Para Term Preterm AB Living  3 1 1   2 1   SAB TAB Ectopic Multiple Live Births  2       1    # Outcome Date GA Lbr Len/2nd Weight Sex Delivery Anes PTL Lv  3 Term 54    M    LIV  2 SAB           1 SAB               Family History: History reviewed. No pertinent family history.  Social History: Social History   Socioeconomic History  . Marital status: Legally Separated    Spouse name: Not on file  . Number of children: Not on file  . Years of education: Not on file  . Highest education level: Not on file  Social Needs  . Financial resource strain: Not on file  . Food insecurity - worry: Not on file  . Food insecurity - inability: Not on file  . Transportation needs - medical: Not on file  .  Transportation needs - non-medical: Not on file  Occupational History  . Not on file  Tobacco Use  . Smoking status: Never Smoker  . Smokeless tobacco: Never Used  Substance and Sexual Activity  . Alcohol use: No  . Drug use: No  . Sexual activity: Not Currently    Birth control/protection: None  Other Topics Concern  . Not on file  Social History Narrative  . Not on file    Allergies: Allergies  Allergen Reactions  . Codeine Phosphate [Codeine]     GI upset  . Penicillins     GI upset    Current Medications: Current Outpatient Medications  Medication Sig Dispense Refill  . acetaminophen (TYLENOL) 500 MG tablet Take 1,000 mg by mouth 2 (two) times daily.     Marland Kitchen amitriptyline (ELAVIL) 50 MG tablet Take 50 mg by mouth at bedtime.    Marland Kitchen amLODipine (NORVASC) 10 MG tablet Take 10 mg by mouth daily.    Marland Kitchen aspirin 81 MG tablet Take 81 mg by mouth daily.    . Calcium 600-200 MG-UNIT tablet Take 1 tablet by mouth 2 (two) times daily.    Marland Kitchen docusate sodium (COLACE) 100 MG capsule Take 200 mg by mouth daily.    . fluticasone (FLONASE) 50 MCG/ACT nasal  spray Place 2 sprays into both nostrils daily.    Marland Kitchen gabapentin (NEURONTIN) 600 MG tablet Take by mouth 3 (three) times daily.     . Infant Care Products [facility] EX) Apply topically 2 times a day when needed for rash    . loratadine (CLARITIN) 10 MG tablet Take 10 mg by mouth daily.    Marland Kitchen lovastatin (MEVACOR) 40 MG tablet Take 80 mg by mouth at bedtime.    . magnesium hydroxide (MILK OF MAGNESIA) 400 MG/5ML suspension Take 30 mLs by mouth daily as needed (constipation).     . meloxicam (MOBIC) 15 MG tablet Take 15 mg by mouth daily.     . Menthol, Topical Analgesic, (BIOFREEZE) 4 % GEL Apply 1 application topically 3 (three) times daily as needed (joint pain).     . metFORMIN (GLUCOPHAGE) 1000 MG tablet Take 1,000 mg by mouth 2 (two) times daily with a meal.    . metoprolol (LOPRESSOR) 100 MG tablet Take 100 mg by mouth 2 (two) times daily.     Marland Kitchen omeprazole (PRILOSEC) 20 MG capsule Take 20 mg by mouth daily.    Marland Kitchen oxybutynin (DITROPAN XL) 15 MG 24 hr tablet Take 15 mg by mouth at bedtime.    . polyethylene glycol (MIRALAX / GLYCOLAX) packet Take 17 g by mouth every Monday, Wednesday, and Friday.    . polyvinyl alcohol (LIQUIFILM TEARS) 1.4 % ophthalmic solution Place 1 drop into both eyes 2 (two) times daily as needed for dry eyes.    Marland Kitchen senna-docusate (SENOKOT-S) 8.6-50 MG tablet Take 1 tablet by mouth 2 (two) times daily.     . Throat Lozenges (MENTHOL COUGH DROPS MT) Use as directed 1 lozenge in the mouth or throat every 2 (two) hours as needed (cough).      No current facility-administered medications for this visit.     Review of Systems Per HPI   Objective:  Physical Examination:  BP (!) 157/85   Pulse 91   Temp 97.6 F (36.4 C) (Tympanic)   Resp 18   Ht 5\' 7"  (1.702 m)   Wt (!) 319 lb (144.7 kg)   BMI 49.96 kg/m    ECOG Performance Status: 1 -  Symptomatic but completely ambulatory  General appearance: alert, cooperative and appears stated age HEENT:PERRLA, extra ocular  movement intact, neck supple with midline trachea and thyroid without masses Lymph node survey: non-palpable, axillary, inguinal, supraclavicular Cardiovascular: regular rate and rhythm, no murmurs or gallops Respiratory: normal air entry, lungs clear to auscultation and no rales, rhonchi or wheezing Breast exam: not examined. Abdomen: soft, non-tender, without masses or organomegaly, no hernias and well healed incision Back: inspection of back is normal Extremities: extremities normal, atraumatic, no cyanosis or edema Skin exam - normal coloration and turgor, no rashes, no suspicious skin lesions noted. Neurological exam reveals alert, oriented, normal speech, no focal findings or movement disorder noted.  Pelvic: exam chaperoned by nurse;  Vulva: normal appearing vulva with no masses, tenderness or lesions; Vagina: normal vagina; Adnexa: normal adnexa in size, nontender and no masses; Uterus: uterus is normal size, shape, consistency and nontender; Cervix: no lesions; Rectal: normal rectal, no masses.  Procedure note: time out performed.  A tenaculum was placed on the cervix. Uterus sounded to 7 cm and minimal tissue obtained with [place], endocervical curretage also performed as well as PAP.  She tolerated this well and there was minimal bleeding or discomfort.     Assessment:  [name] is a 67 y.o. 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  There is also some breakdown of the tissue. Path from [facility] reviewed at [facility] and also equivocal.  No further bleeding in past several months.   Medical co-morbidities complicating care: Morbid obesity (BMI 50), Diabetes with foot ulcer and osteomyelitis, large ventral hernia, history of colon and breast cancer.   Plan:    Problem List Items Addressed This Visit    None    Visit Diagnoses    Malignant neoplasm of body of uterus, unspecified site [facility])    -  Primary     I discussed management with the patient.  Since she is not having any bleeding and in view of the equivocal path and her significant comorbidities, would like to avoid surgery.  That said, in view of atypia on prior biopsies we repeated endometrial biopsy, ECC and PAP/HPV today.  We will contact her with results next week.  If reassuring she will RTC in 3 months or sooner if any symptoms arise.    The patient's diagnosis, an outline of the further diagnostic and laboratory studies which will be required, the recommendation, and alternatives were discussed.  All questions were answered to the patient's satisfaction.  Mellody Drown, MD  CC:  Denton Lank, MD [address] [place], [place] [address] [phone]

## 2017-06-30 ENCOUNTER — Ambulatory Visit
Admission: RE | Admit: 2017-06-30 | Discharge: 2017-06-30 | Disposition: A | Discharge status: Home / Self Care | Payer: Medicare Other | Source: Ambulatory Visit | Attending: Family Medicine | Admitting: Family Medicine

## 2017-06-30 DIAGNOSIS — Z1231 Encounter for screening mammogram for malignant neoplasm of breast: Secondary | ICD-10-CM | POA: Insufficient documentation

## 2017-06-30 LAB — PAP LB AND HPV HIGH-RISK
HPV, HIGH-RISK: NEGATIVE
PAP Smear Comment: 0

## 2017-07-03 LAB — SURGICAL PATHOLOGY

## 2017-09-27 ENCOUNTER — Inpatient Hospital Stay: Payer: Medicare Other | Attending: Obstetrics and Gynecology | Admitting: Obstetrics and Gynecology

## 2017-09-27 ENCOUNTER — Encounter (INDEPENDENT_AMBULATORY_CARE_PROVIDER_SITE_OTHER): Payer: Self-pay

## 2017-09-27 VITALS — BP 131/82 | HR 89 | Temp 97.5°F | Resp 18 | Ht 67.0 in | Wt 310.0 lb

## 2017-09-27 DIAGNOSIS — N8500 Endometrial hyperplasia, unspecified: Secondary | ICD-10-CM

## 2017-09-27 NOTE — Progress Notes (Signed)
Gynecologic Oncology Interval Visit   Referring Provider: Dr. 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  Subjective:  [name] is a 67 y.o. female [id number] who is seen in consultation from Dr. Marcelline Mates for possible uterine cancer.  She still has not had any more bleeding, discharge, pain or other Gyn symptoms.  Still lives in adult care. Comes for follow up and continued surveillance. She states she her diagnosis has not been discussed with her and she declined exam today.   06/28/2017  DIAGNOSIS:  A. ENDOCERVICAL CURETTAGE:  - ACUTE AND CHRONIC CERVICITIS.  - RARE STRIPS OF ATROPHIC ENDOMETRIUM.  - SQUAMOUS MUCOSA WITH ACUTE INFLAMMATION.  - NEGATIVE FOR ATYPIA AND MALIGNANCY.  - DEEPER SECTIONS WERE EXAMINED.   DIAGNOSIS:  A. ENDOMETRIUM; PIPELLE BIOPSY:  - LIMITED SPECIMEN CONSISTING OF SMALL STRIPS OF ATROPHIC ENDOMETRIUM.  - RARE FOCUS OF METAPLASTIC CELLS WITH ACUTE INFLAMMATION.  - DEEPER SECTIONS EXAMINED.   Pap NILM; HRHPV negative  Oncology history Patient presented with a few days of postmenopausal bleeding 7/18 and endometrial biopsy with Dr Marcelline Mates showed suspicion for mucinous cancer (see below).   PAP smear normal.  No bleeding after that.  History of breast cancer about 25 years ago and colon cancer about 7 years ago treated by colectomy, and morbid obesity and Type II DM.  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    Pelvic US 12/02/16: Probable uterine leiomyomas, largest potentially containing fat. Abnormal thickening of the endometrial complex for a postmenopausal patient at 15 mm thick with a small amount of endometrial fluid.  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 COMMENT: THE BIOPSY IS  SCANT, RECOMMEND ADDITIONAL BIOPSY FOR BETTER  EVALUATION.    02/06/17 Dr Marcelline Mates did hysteroscopy and D&C.  No concerning finding seen. 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   Comment:  Additional deeper sections were reviewed. The total amount of tissue is  relatively small. Several small fragments of metaplastic epithelium are  present, with mixed features including mucinous, tubal, papillary  syncytial, and eosinophilic metaplasia. Some fragments consist of  metaplastic epithelium surfacing endometrial stroma with breakdown  changes and acute inflammation. Much of the metaplastic epithelium is  associated with a polypoid endometrial fragment. Some of the fragments  show moderate glandular architectural complexity, but there is no  significant cytologic atypia. The previous endometrial biopsy was reviewed ([id number], taken  12/07/16). In comparison, the curettage specimen contains fragments with  a more varied cellular composition and identifiable stromal breakdown.  The degenerated material seen in the biopsy is not present in the  curettings. It is possible that the metaplastic changes are limited to a  degenerating endometrial polyp, but in such fragmented samples it cannot  be determined if this is a localized or diffuse lesion. Close follow-up  with repeat sampling is advised, at a minimum, to be sure there is no  associated neoplasm.   Path from [facility] at [facility] and there were reported a few fragments of mucinous glands with no high grade atypia and only rare mitoses.  This could be adenocarcinoma vs reactive/metaplastic changes.    In view of her medical comorbidities, lack of symptoms and no definitive cancer diagnosis, it was decided not to do  hysterectomy, rather to repeat tissue sampling in 3 months.   - Patient up to date on colonoscopy (performed last year 08/2015,  with internal hemorrhoids and benign polyps noted).  Problem List: Patient Active Problem List   Diagnosis Date Noted  . 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[place]) 12/24/2016  . Diabetic foot ulcer ([place]) 12/24/2016  . Electrocardiogram abnormal 12/24/2016  . Hypercholesterolemia 12/24/2016  . Hypomagnesemia 12/24/2016  . Joint pain 12/24/2016  . Morbid obesity ([place]) 12/24/2016  . 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 Ventral incisional hernia 12/24/2016  . Diabetes mellitus ([place]) 11/27/2013  . Essential hypertension 11/27/2013  . Mixed hyperlipidemia 11/27/2013    Past Medical History: Past Medical History:  Diagnosis Date  . Breast cancer ([place])    left breast mastectomy  . Breast cancer ([place])   . Carpal tunnel syndrome    Bilateral  . 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[place])   . Colon cancer ([place])   . Dysrhythmia   . Edema    FEET AND LEGS  . GERD (gastroesophageal reflux disease)   . Heart murmur   . History of hiatal hernia   . History of tachycardia   . Hypertension   . Iron deficiency anemia   . Morbid obesity ([place])   . 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 Osteoporosis, post-menopausal   . Shortness of breath dyspnea   . Type 2 diabetes mellitus without complication ([place])   . Valvular heart disease     Past Surgical History: Past Surgical History:  Procedure Laterality Date  . BREAST REDUCTION SURGERY Right   . BREAST SURGERY    . CATARACT EXTRACTION W/PHACO Right 01/26/2016   Procedure: CATARACT EXTRACTION PHACO AND INTRAOCULAR LENS PLACEMENT (IOC);  Surgeon: Birder Robson, MD;  Location: [facility] ORS;  Service: Ophthalmology;  Laterality: Right;  [place]  00:26 AP% 21.5 CDE 5.70 Fluid pack lot # [id number]  . COLON SURGERY     RESECTION  . COLONOSCOPY    . COLONOSCOPY WITH PROPOFOL N/A 09/07/2015   Procedure: COLONOSCOPY WITH PROPOFOL;  Surgeon: Manya Silvas, MD;  Location:  [facility] ENDOSCOPY;  Service: Endoscopy;  Laterality: N/A;  . DILATION AND CURETTAGE OF UTERUS    . ESOPHAGOGASTRODUODENOSCOPY    . EYE SURGERY Bilateral    Cataract Extraction with IOL  . FOOT AMPUTATION Right    heel  . HYSTEROSCOPY W/D&C N/A 02/06/2017   Procedure: DILATATION AND CURETTAGE /HYSTEROSCOPY;  Surgeon: Rubie Maid, MD;  Location: [facility] ORS;  Service: Gynecology;  Laterality: N/A;  . INCISION AND DRAINAGE Bilateral    FOOT LESIONS  . KNEE ARTHROSCOPY Left   . KNEE ARTHROSCOPY Left   . LAPAROSCOPIC RIGHT COLECTOMY  2002  . MASTECTOMY Left 1992   Left mastectomy with right breast reduction for breast cancer  . OTHER SURGICAL HISTORY     Gastric Sleeve  . REDUCTION MAMMAPLASTY Right    breast reduction on RIGHT after left mastectomy  . TONSILLECTOMY       OB History:  OB History  Gravida Para Term Preterm AB Living  '3 1 1   2 1  '$ SAB TAB Ectopic Multiple Live Births  2       1    # Outcome Date GA Lbr Len/2nd Weight Sex Delivery Anes PTL Lv  3 Term 59    M    LIV  2 SAB  1 SAB             Family History: Family History  Problem Relation Age of Onset  . Breast cancer Neg Hx     Social History: Social History   Socioeconomic History  . Marital status: Legally Separated    Spouse name: Not on file  . Number of children: Not on file  . Years of education: Not on file  . Highest education level: Not on file  Occupational History  . Occupation: reitred.    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 Financial resource strain: Not on file  . Food insecurity:    Worry: Not on file    Inability: Not on file  . Transportation needs:    Medical: Not on file    Non-medical: Not on file  Tobacco Use  . Smoking status: Never Smoker  . Smokeless tobacco: Never Used  Substance and Sexual Activity  . Alcohol use: No  . Drug use: No  . Sexual activity: Not Currently    Birth control/protection: None   Lifestyle  . Physical activity:    Days per week: Not on file    Minutes per session: Not on file  . Stress: Not on file  Relationships  . Social connections:    Talks on phone: Not on file    Gets together: Not on file    Attends religious service: Not on file    Active member of club or organization: Not on file    Attends meetings of clubs or organizations: Not on file    Relationship status: Not on file  . Intimate partner violence:    Fear of current or ex partner: Not on file    Emotionally abused: Not on file    Physically abused: Not on file    Forced sexual activity: Not on file  Other Topics Concern  . Not on file  Social History Narrative  . Not on file    Allergies: Allergies  Allergen Reactions  . Codeine Phosphate [Codeine]     GI upset  . Penicillins     GI upset    Current Medications: Current Outpatient Medications  Medication Sig Dispense Refill  . acetaminophen (TYLENOL) 500 MG tablet Take 1,000 mg by mouth 2 (two) times daily.     . Alpha-Lipoic Acid 600 MG CAPS Take 1 capsule by mouth daily.    Marland Kitchen amitriptyline (ELAVIL) 50 MG tablet Take 50 mg by mouth at bedtime.    Marland Kitchen amLODipine (NORVASC) 10 MG tablet Take 10 mg by mouth daily.    Marland Kitchen aspirin 81 MG tablet Take 81 mg by mouth daily.    . benzonatate (TESSALON) 100 MG capsule Take 100 mg by mouth 3 (three) times daily as needed for cough.    . Calcium 600-200 MG-UNIT tablet Take 1 tablet by mouth 2 (two) times daily.    Marland Kitchen dextromethorphan 15 MG/5ML syrup Take 5 mLs by mouth every 6 (six) hours as needed for cough.    . docusate sodium (COLACE) 100 MG capsule Take 200 mg by mouth daily.    . Dulaglutide (TRULICITY) 9.37 JI/9.6VE SOPN Inject 1 Dose into the skin every Saturday.    . fluticasone (FLONASE) 50 MCG/ACT nasal spray Place 2 sprays into both nostrils daily.    Marland Kitchen gabapentin (NEURONTIN) 600 MG tablet Take by mouth 3 (three) times daily.     . Infant Care Products [facility] EX) Apply  topically  2 times a day when needed for rash    . loratadine (CLARITIN) 10 MG tablet Take 10 mg by mouth daily.    Marland Kitchen lovastatin (MEVACOR) 40 MG tablet Take 80 mg by mouth at bedtime.    . magnesium hydroxide (MILK OF MAGNESIA) 400 MG/5ML suspension Take 30 mLs by mouth daily as needed (constipation).     . meloxicam (MOBIC) 15 MG tablet Take 15 mg by mouth daily.     . Menthol, Topical Analgesic, (BIOFREEZE) 4 % GEL Apply 1 application topically 3 (three) times daily as needed (joint pain).     . metFORMIN (GLUCOPHAGE) 1000 MG tablet Take 1,000 mg by mouth 2 (two) times daily with a meal.    . metoprolol (LOPRESSOR) 100 MG tablet Take 100 mg by mouth 2 (two) times daily.     Marland Kitchen omeprazole (PRILOSEC) 20 MG capsule Take 20 mg by mouth daily.    Marland Kitchen oxybutynin (DITROPAN XL) 15 MG 24 hr tablet Take 15 mg by mouth at bedtime.    . polyethylene glycol (MIRALAX / GLYCOLAX) packet Take 17 g by mouth every Monday, Wednesday, and Friday.    . polyvinyl alcohol (LIQUIFILM TEARS) 1.4 % ophthalmic solution Place 1 drop into both eyes 2 (two) times daily as needed for dry eyes.    Marland Kitchen senna-docusate (SENOKOT-S) 8.6-50 MG tablet Take 1 tablet by mouth 2 (two) times daily.     . Throat Lozenges (MENTHOL COUGH DROPS MT) Use as directed 1 lozenge in the mouth or throat every 2 (two) hours as needed (cough).      No current facility-administered medications for this visit.     Review of Systems Per HPI   Objective:  Physical Examination:  BP 131/82   Pulse 89   Temp (!) 97.5 F (36.4 C) (Tympanic)   Resp 18   Ht '5\' 7"'$  (1.702 m)   Wt (!) 310 lb (140.6 kg)   BMI 48.55 kg/m    ECOG Performance Status: 1 - Symptomatic but completely ambulatory  Deferred due to patient request.     Assessment:  [name] is a 68 y.o. female with postmenopausal bleeding and endometrial biopsy concerning for mucinous cancer, but D&C hysteroscopy only shows benign polyp with fragments of metaplastic epithelium  with mixed features  including mucinous, tubal, papillary syncytial, and eosinophilic metaplasia.  There is also some breakdown of the tissue. Path from [facility] at [facility] and also equivocal.  Follow up biopsies and Pap negative, however endometrial biopsy was a limited specimen.   Medical co-morbidities complicating care: Morbid obesity (BMI 50), Diabetes with foot ulcer and osteomyelitis, large ventral hernia, history of colon and breast cancer.   Plan:   Problem List Items Addressed This Visit    None    Visit Diagnoses    Endometrial hyperplasia    -  Primary   Relevant Orders   US PELVIC COMPLETE WITH TRANSVAGINAL     We discussed diagnosis and management with the patient. Beckey Rutter NP saw her last with Dr. Fransisca Connors and recalled having a discussion with the patient regarding her diagnosis and care plan. We reviewed that we felt it was important to make sure her needs were met and she completely understood her diagnosis and results. We also reviewed issues such as memory loss. It does not sound like memory issues have been of concern for her. Having written material may assist her with understanding her medical issues. We reviewed she was referred to Dr.  Berchuck due to concern about possible mucinous cancer of the uterus. But subsequent evaluation was negative for definite cancer. She did recall Dr. Fransisca Connors discussed taking medication for the condition. She does have one son and we offered to have him participate via telephone when we have a medical conversation with her. She seemed happy with these ideas.  We also recommended repeating the pelvic ultrasound to assess the endometrial stripe. We explained that she had a thickened EMS in the past measuring 15 mm and the ultrasound would help [place] follow up this finding. We also discussed that the thickened EMS can represent either cancer or benign conditions such as a polyp. She agrees with this plan and will follow up after the ultrasound.   I personally had a  face to face interaction and evaluated the patient jointly with the NP, Ms. Beckey Rutter.  I have reviewed her history and available records. Ms. Mckibbin declined physical exam.  I have discussed the case with the APP and the patient.  I agree with the above documentation, assessment and plan which was fully formulated by me.  Counseling was completed by me and Beckey Rutter. In total over 40 minutes was spend reviewing her prior history, evaluation, and our recommendations.   [place] Gaetana Michaelis, MD    CC:  Referring Provider: Dr. Marcelline Mates

## 2017-09-27 NOTE — Progress Notes (Signed)
No pain today, feels good. No gyn c/o

## 2017-10-05 ENCOUNTER — Ambulatory Visit: Payer: Medicare Other

## 2017-10-11 ENCOUNTER — Ambulatory Visit: Payer: Medicare Other

## 2017-10-12 ENCOUNTER — Ambulatory Visit
Admission: RE | Admit: 2017-10-12 | Discharge: 2017-10-12 | Disposition: A | Discharge status: Home / Self Care | Payer: Medicare Other | Source: Ambulatory Visit | Attending: Obstetrics and Gynecology | Admitting: Obstetrics and Gynecology

## 2017-10-12 DIAGNOSIS — Z780 Asymptomatic menopausal state: Secondary | ICD-10-CM | POA: Diagnosis not present

## 2017-10-12 DIAGNOSIS — N8500 Endometrial hyperplasia, unspecified: Secondary | ICD-10-CM | POA: Insufficient documentation

## 2017-10-20 ENCOUNTER — Telehealth: Payer: Self-pay

## 2017-10-20 DIAGNOSIS — N8500 Endometrial hyperplasia, unspecified: Secondary | ICD-10-CM

## 2017-10-20 NOTE — Telephone Encounter (Signed)
Call placed to [name]. Results of U/S reviewed. Endometrium remains mildly thickened at 8 mm, decreased since prior study when this measured 42mm. We cannot definitely rule out cancer without a biopsy, but this is reassuring and we can continue to follow. She will need repeat U/S and physician visit in 3 months. We will arrange and call her with these appointments. She repeated this information and verbalized understanding. Oncology Nurse Navigator Documentation  Navigator Location: [facility] (10/20/17 1500)   )Navigator Encounter Type: Telephone (10/20/17 1500) Telephone: Outgoing Call;Diagnostic Results (10/20/17 1500)                                                  Time Spent with Patient: 15 (10/20/17 1500)

## 2017-10-25 ENCOUNTER — Inpatient Hospital Stay: Payer: Medicare Other

## 2018-01-23 ENCOUNTER — Ambulatory Visit: Payer: Medicare Other

## 2018-01-24 ENCOUNTER — Ambulatory Visit: Payer: Medicare Other

## 2018-01-30 ENCOUNTER — Ambulatory Visit: Payer: Medicare Other

## 2018-02-06 ENCOUNTER — Ambulatory Visit
Admission: RE | Admit: 2018-02-06 | Discharge: 2018-02-06 | Disposition: A | Discharge status: Home / Self Care | Payer: Medicare Other | Source: Ambulatory Visit | Attending: Nurse Practitioner | Admitting: Nurse Practitioner

## 2018-02-06 DIAGNOSIS — N8500 Endometrial hyperplasia, unspecified: Secondary | ICD-10-CM | POA: Diagnosis present

## 2018-02-21 ENCOUNTER — Inpatient Hospital Stay: Payer: Medicare Other | Attending: Obstetrics and Gynecology | Admitting: Obstetrics and Gynecology

## 2018-02-21 VITALS — BP 151/84 | HR 89 | Temp 98.2°F | Resp 18 | Ht 67.0 in | Wt 302.1 lb

## 2018-02-21 DIAGNOSIS — R9389 Abnormal findings on diagnostic imaging of other specified body structures: Secondary | ICD-10-CM | POA: Diagnosis present

## 2018-02-21 DIAGNOSIS — N950 Postmenopausal bleeding: Secondary | ICD-10-CM | POA: Diagnosis not present

## 2018-02-21 NOTE — Progress Notes (Signed)
Gynecologic Oncology Interval Visit   Referring Provider: Dr. 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  Subjective:  [name] is a 67 y.o. female [phone] who initially saw Dr. 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  She was last seen in clinic by Dr. Theora Gianotti and Beckey Rutter, NP on 09/27/2017. At that time she was reporting confusion to her diagnosis and declined exam that day. She has had repeat ultrasound as below.   02/06/2018- US Pelvic Uterus: 5.9 x 3.9 x 3.8 cm. Multiple small fibroids, largest measuring 2.1 x 1.6 x 2.4 cm in left fundus.  Endometrium: thickness 20mm without focal abnormality visualized Right ovary: measuring 2.8 x 1.5 x 1.7 normal appearing Left ovary: not visualized.  No abnormal free fluid.   Today, no complaints of bleeding or discharge of other gyn problems.   Oncology history Patient presented with a few days of postmenopausal bleeding 7/18 and endometrial biopsy with Dr Marcelline Mates showed suspicion for mucinous cancer (see below).   PAP smear normal.  No bleeding after that.  History of breast cancer about 25 years ago and colon cancer about 7 years ago treated by colectomy, and morbid obesity and Type II DM.  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    Pelvic US 12/02/16: Probable uterine leiomyomas, largest potentially containing fat. Abnormal thickening of the endometrial complex for a postmenopausal patient at 15 mm thick with a small amount of endometrial fluid.  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 COMMENT: THE BIOPSY IS SCANT, RECOMMEND ADDITIONAL BIOPSY FOR BETTER  EVALUATION.     02/06/17 Dr Marcelline Mates did hysteroscopy and D&C.  No concerning finding seen. 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   Comment:  Additional deeper sections were reviewed. The total amount of tissue is relatively small. Several small fragments of metaplastic epithelium are present, with mixed features including mucinous, tubal, papillary  syncytial, and eosinophilic metaplasia. Some fragments consist of metaplastic epithelium surfacing endometrial stroma with breakdown changes and acute inflammation. Much of the metaplastic epithelium is associated with a polypoid endometrial fragment. Some of the fragments show moderate glandular architectural complexity, but there is no significant cytologic atypia. The previous endometrial biopsy was reviewed ([id number], taken 12/07/16). In comparison, the curettage specimen contains fragments with a more varied cellular composition and identifiable stromal breakdown. The degenerated material seen in the biopsy is not present in the curettings. It is possible that the metaplastic changes are limited to a degenerating endometrial polyp, but in such fragmented samples it cannot be determined if this is a localized or diffuse lesion. Close follow-up with repeat sampling is advised, at a minimum, to be sure there is no associated neoplasm.   Path from [facility] was reviewed at [facility] and there were reported a few fragments of mucinous glands with no high grade atypia and only rare mitoses.  This could be adenocarcinoma vs reactive/metaplastic changes.    In view of her medical comorbidities, lack of symptoms and no definitive cancer diagnosis, it was decided not to do hysterectomy, rather to repeat tissue sampling in 3 months.   - Patient up to date on colonoscopy (performed last year 08/2015, with internal hemorrhoids and  benign polyps noted).  06/28/2017 DIAGNOSIS:  A.  ENDOCERVICAL CURETTAGE:  - ACUTE AND CHRONIC CERVICITIS.  - RARE STRIPS OF ATROPHIC ENDOMETRIUM.  - SQUAMOUS MUCOSA WITH ACUTE INFLAMMATION.  - NEGATIVE FOR ATYPIA AND MALIGNANCY.  - DEEPER SECTIONS WERE EXAMINED.   DIAGNOSIS:  A. ENDOMETRIUM; PIPELLE BIOPSY:  - LIMITED SPECIMEN CONSISTING OF SMALL STRIPS OF ATROPHIC ENDOMETRIUM.  - RARE FOCUS OF METAPLASTIC CELLS WITH ACUTE INFLAMMATION.  - DEEPER SECTIONS EXAMINED.   Pap NILM; HRHPV negative  Problem List: Patient Active Problem List   Diagnosis Date Noted  . Postmenopausal bleeding 02/21/2018  . 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[place]) 12/24/2016  . Diabetic foot ulcer ([place]) 12/24/2016  . Electrocardiogram abnormal 12/24/2016  . Hypercholesterolemia 12/24/2016  . Hypomagnesemia 12/24/2016  . Joint pain 12/24/2016  . Morbid obesity ([place]) 12/24/2016  . 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 Ventral incisional hernia 12/24/2016  . Diabetes mellitus ([place]) 11/27/2013  . Essential hypertension 11/27/2013  . Mixed hyperlipidemia 11/27/2013    Past Medical History: Past Medical History:  Diagnosis Date  . Breast cancer ([place])    left breast mastectomy  . Breast cancer ([place])   . Carpal tunnel syndrome    Bilateral  . 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[place])   . Colon cancer ([place])   . Dysrhythmia   . Edema    FEET AND LEGS  . GERD (gastroesophageal reflux disease)   . Heart murmur   . History of hiatal hernia   . History of tachycardia   . Hypertension   . Iron deficiency anemia   . Morbid obesity ([place])   . 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 Osteoporosis, post-menopausal   . Shortness of breath dyspnea   . Type 2 diabetes mellitus without complication ([place])   . Valvular heart disease     Past Surgical History: Past Surgical History:  Procedure Laterality Date  . BREAST REDUCTION SURGERY Right   . BREAST SURGERY    . CATARACT  EXTRACTION W/PHACO Right 01/26/2016   Procedure: CATARACT EXTRACTION PHACO AND INTRAOCULAR LENS PLACEMENT (IOC);  Surgeon: Birder Robson, MD;  Location: [facility] ORS;  Service: Ophthalmology;  Laterality: Right;  [place]  00:26 AP% 21.5 CDE 5.70 Fluid pack lot # [id number]  . COLON SURGERY     RESECTION  . COLONOSCOPY    . COLONOSCOPY WITH PROPOFOL N/A 09/07/2015   Procedure: COLONOSCOPY WITH PROPOFOL;  Surgeon: Manya Silvas, MD;  Location[facility] END[facility]COPY;  Service: Endoscopy;  Laterality: N/A;  . DILATION AND CURETTAGE OF UTERUS    . ESOPHAGOGASTRODUODENOSCOPY    . EYE SURGERY Bilateral    Cataract Extraction with IOL  . FOOT AMPUTATION Right    heel  . HYSTEROSCOPY W/D&C N/A 02/06/2017   Procedure: DILATATION AND CURETTAGE /HYSTEROSCOPY;  Surgeon: Rubie Maid, MD;  Location: [facility] ORS;  Service: Gynecology;  Laterality: N/A;  . INCISION AND DRAINAGE Bilateral    FOOT LESIONS  . KNEE ARTHROSCOPY Left   . KNEE ARTHROSCOPY Left   . LAPAROSCOPIC RIGHT COLECTOMY  2002  . MASTECTOMY Left 1992   Left mastectomy with right breast reduction for breast cancer  . OTHER SURGICAL HISTORY     Gastric Sleeve  . REDUCTION MAMMAPLASTY Right    breast reduction on RIGHT after left mastectomy  . TONSILLECTOMY       OB History:  OB History  Gravida Para Term Preterm AB  Living  3 1 1   2 1   SAB TAB Ectopic Multiple Live Births  2       1    # Outcome Date GA Lbr Len/2nd Weight Sex Delivery Anes PTL Lv  3 Term 34    M    LIV  2 SAB           1 SAB             Family History: Family History  Problem Relation Age of Onset  . Breast cancer Neg Hx     Social History: Social History   Socioeconomic History  . Marital status: Legally Separated    Spouse name: Not on file  . Number of children: Not on file  . Years of education: Not on file  . Highest education level: Not on file  Occupational History  . Occupation: reitred.    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 Financial resource strain: Not on file  . Food insecurity:    Worry: Not on file    Inability: Not on file  . Transportation needs:    Medical: Not on file    Non-medical: Not on file  Tobacco Use  . Smoking status: Never Smoker  . Smokeless tobacco: Never Used  Substance and Sexual Activity  . Alcohol use: No  . Drug use: No  . Sexual activity: Not Currently    Birth control/protection: None  Lifestyle  . Physical activity:    Days per week: Not on file    Minutes per session: Not on file  . Stress: Not on file  Relationships  . Social connections:    Talks on phone: Not on file    Gets together: Not on file    Attends religious service: Not on file    Active member of club or organization: Not on file    Attends meetings of clubs or organizations: Not on file    Relationship status: Not on file  . Intimate partner violence:    Fear of current or ex partner: Not on file    Emotionally abused: Not on file    Physically abused: Not on file    Forced sexual activity: Not on file  Other Topics Concern  . Not on file  Social History Narrative  . Not on file    Allergies: Allergies  Allergen Reactions  . Codeine Phosphate [Codeine]     GI upset  . Penicillins     GI upset    Current Medications: Current Outpatient Medications  Medication Sig Dispense Refill  . acetaminophen (TYLENOL) 500 MG tablet Take 1,000 mg by mouth 2 (two) times daily.     . Alpha-Lipoic Acid 600 MG CAPS Take 1 capsule by mouth daily.    Marland Kitchen amitriptyline (ELAVIL) 50 MG tablet Take 50 mg by mouth at bedtime.    Marland Kitchen amLODipine (NORVASC) 10 MG tablet Take 10 mg by mouth daily.    Marland Kitchen aspirin 81 MG tablet Take 81 mg by mouth daily.    . benzonatate (TESSALON) 100 MG capsule Take 100 mg by mouth 3 (three) times daily as needed for cough.    . Calcium 600-200 MG-UNIT tablet Take 1 tablet by mouth 2 (two) times daily.    Marland Kitchen dextromethorphan 15 MG/5ML syrup Take 5 mLs by  mouth every 6 (six) hours as needed for cough.    . docusate sodium (COLACE) 100 MG capsule Take  200 mg by mouth daily.    . Dulaglutide (TRULICITY) 0.09 FG/1.8EX SOPN Inject 1 Dose into the skin every Saturday.    . fluticasone (FLONASE) 50 MCG/ACT nasal spray Place 2 sprays into both nostrils daily.    Marland Kitchen gabapentin (NEURONTIN) 600 MG tablet Take by mouth 3 (three) times daily.     . Infant Care Products [facility] EX) Apply topically 2 times a day when needed for rash    . loratadine (CLARITIN) 10 MG tablet Take 10 mg by mouth daily.    Marland Kitchen lovastatin (MEVACOR) 40 MG tablet Take 80 mg by mouth at bedtime.    . magnesium hydroxide (MILK OF MAGNESIA) 400 MG/5ML suspension Take 30 mLs by mouth daily as needed (constipation).     . meloxicam (MOBIC) 15 MG tablet Take 15 mg by mouth daily.     . Menthol, Topical Analgesic, (BIOFREEZE) 4 % GEL Apply 1 application topically 3 (three) times daily as needed (joint pain).     . metFORMIN (GLUCOPHAGE) 1000 MG tablet Take 1,000 mg by mouth 2 (two) times daily with a meal.    . metoprolol (LOPRESSOR) 100 MG tablet Take 100 mg by mouth 2 (two) times daily.     Marland Kitchen omeprazole (PRILOSEC) 20 MG capsule Take 20 mg by mouth daily.    Marland Kitchen oxybutynin (DITROPAN XL) 15 MG 24 hr tablet Take 15 mg by mouth at bedtime.    . polyethylene glycol (MIRALAX / GLYCOLAX) packet Take 17 g by mouth every Monday, Wednesday, and Friday.    . polyvinyl alcohol (LIQUIFILM TEARS) 1.4 % ophthalmic solution Place 1 drop into both eyes 2 (two) times daily as needed for dry eyes.    Marland Kitchen senna-docusate (SENOKOT-S) 8.6-50 MG tablet Take 1 tablet by mouth 2 (two) times daily.     . Throat Lozenges (MENTHOL COUGH DROPS MT) Use as directed 1 lozenge in the mouth or throat every 2 (two) hours as needed (cough).      No current facility-administered medications for this visit.     Review of Systems General:  no complaints Skin: no complaints Eyes: no complaints HEENT: no complaints Breasts: no  complaints Pulmonary: no complaints Cardiac: no complaints Gastrointestinal: no complaints Genitourinary/Sexual: no complaints Ob/Gyn: no complaints Musculoskeletal: no complaints Hematology: no complaints Neurologic/Psych: no complaints   Objective:  Physical Examination:  BP (!) 151/84   Pulse 89   Temp 98.2 F (36.8 C) (Tympanic)   Resp 18   Ht 5\' 7"  (1.702 m)   Wt (!) 302 lb 1.6 oz (137 kg)   BMI 47.32 kg/m    ECOG Performance Status: 1 - Symptomatic but completely ambulatory  GENERAL: Patient is a well appearing female in no acute distress HEENT:  PERRL, neck supple with midline trachea. Thyroid without masses.  NODES:  No cervical, supraclavicular, axillary, or inguinal lymphadenopathy palpated.  LUNGS:  Clear to auscultation bilaterally.  No wheezes or rhonchi. HEART:  Regular rate and rhythm. No murmur appreciated. ABDOMEN:  Soft, nontender.  Positive, normoactive bowel sounds.  MSK:  No focal spinal tenderness to palpation. Full range of motion bilaterally in the upper extremities. EXTREMITIES:  No peripheral edema.   SKIN:  Clear with no obvious rashes or skin changes. No nail dyscrasia. NEURO:  Nonfocal. Well oriented.  Appropriate affect.  Pelvic: Exam Chaperoned by NP EGBUS: no lesions Cervix: no lesions, nontender, mobile Vagina: no lesions, no discharge or bleeding Uterus: normal size, nontender, mobile Adnexa: no palpable masses Rectovaginal: confirmatory  Assessment:  LUTISHA KNOCHE is a 68 y.o. 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  There is also some breakdown of the tissue. Path from [facility] reviewed at [facility] and also equivocal.  Follow up ECC and endometrial biopsies and Pap negative.   Medical co-morbidities  complicating care: Morbid obesity (BMI 50), Diabetes with foot ulcer and osteomyelitis, large ventral hernia, history of colon and breast cancer.   Plan:   Problem List Items Addressed This Visit      Other   Postmenopausal bleeding    Other Visit Diagnoses    Endometrial thickening on ultrasound    -  Primary     We discussed diagnosis and management with the patient. We have followed her for over a year and no evidence of uterine cancer has emerged.  [place] today is stable and reassuring.  In view of this she will have her follow up with Dr Posey Pronto with pelvic US in 6 months.  We can see her back if any new concerning findings on scan or id she develops bleeding or other concerning symptoms.  Explained to the patient that there is no evidence of cancer after more than a year of follow up and she is comfortable following with Dr Posey Pronto.    Mellody Drown, MD  Beckey Rutter, DNP, AGNP-C [place] [facility] (work cell) [phone] (office)  CC:  Referring Provider: Dr. Marcelline Mates

## 2018-02-21 NOTE — Patient Instructions (Signed)
Follow up with Dr Posey Pronto to be referred to a regular GYN provider

## 2018-02-21 NOTE — Progress Notes (Signed)
No gyn conerns

## 2018-04-16 ENCOUNTER — Other Ambulatory Visit: Payer: Self-pay | Admitting: Family Medicine

## 2018-04-16 DIAGNOSIS — Z13820 Encounter for screening for osteoporosis: Secondary | ICD-10-CM

## 2018-05-06 IMAGING — US US PELVIS COMPLETE
1 series · 13 of 25 positions shown · non-contrast
Comparison: None

CLINICAL DATA: Mid pelvic pain and postmenopausal vaginal bleeding
for 6 hours

EXAM:
TRANSABDOMINAL AND TRANSVAGINAL ULTRASOUND OF PELVIS
TECHNIQUE: Both transabdominal and transvaginal ultrasound examinations of the
pelvis were performed. Transabdominal technique was performed for
global imaging of the pelvis including uterus, ovaries, adnexal
regions, and pelvic cul-de-sac. It was necessary to proceed with
endovaginal exam following the transabdominal exam to visualize the
LEFT ovary and endometrium.

[Series 1: us pelvis complete · 0.28mm/px · 13 of 90 slices shown]
[im 1/90]
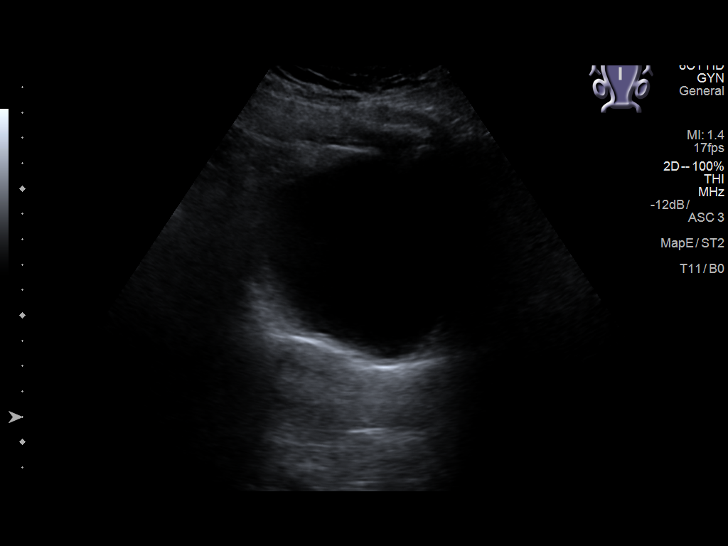
[im 8/90]
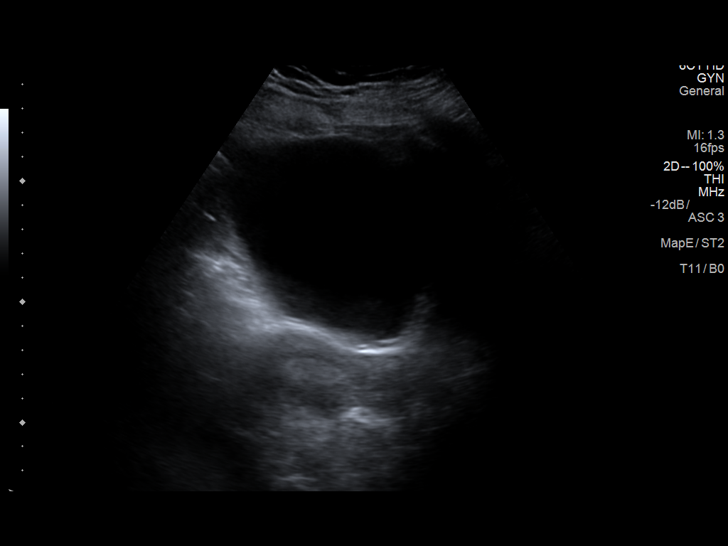
[im 15/90]
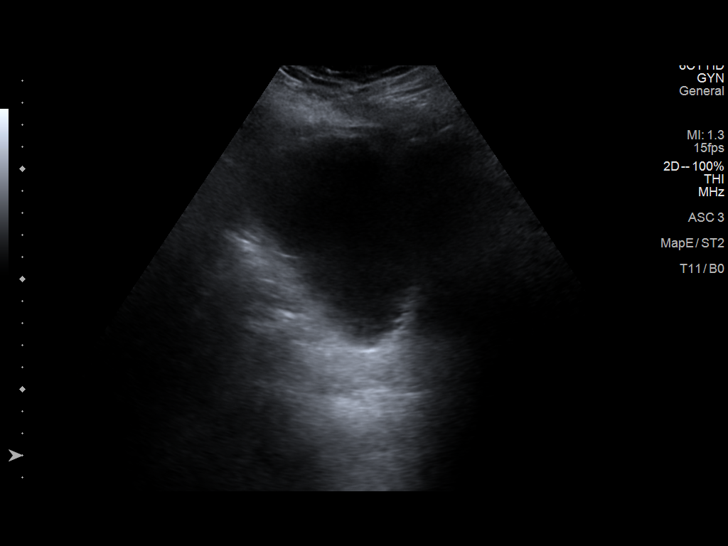
[im 23/90]
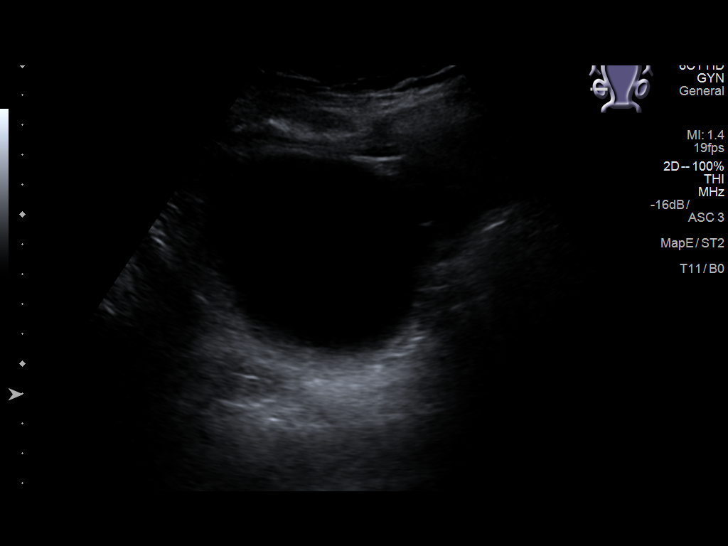
[im 30/90]
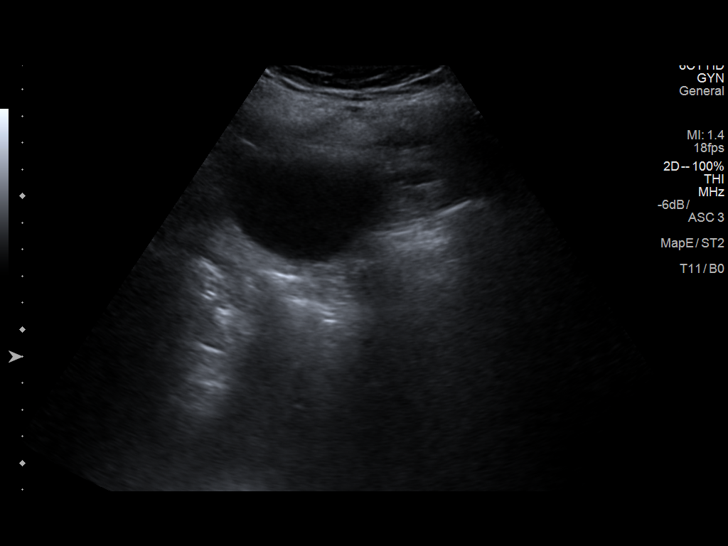
[im 38/90]
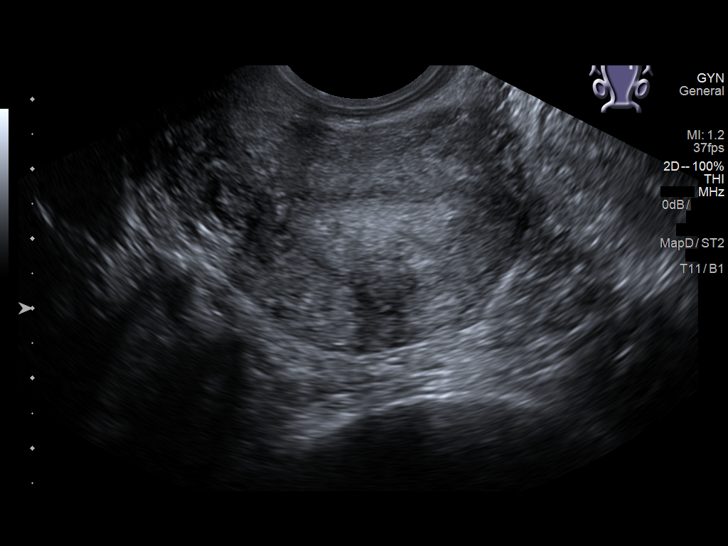
[im 45/90]
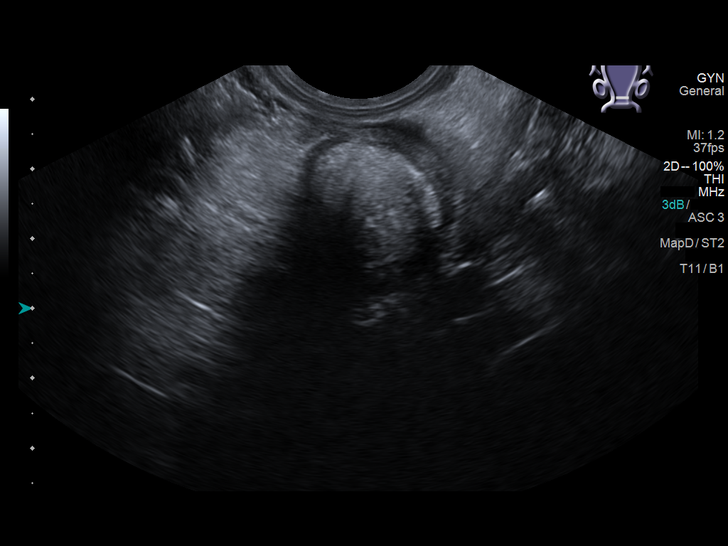
[im 52/90]
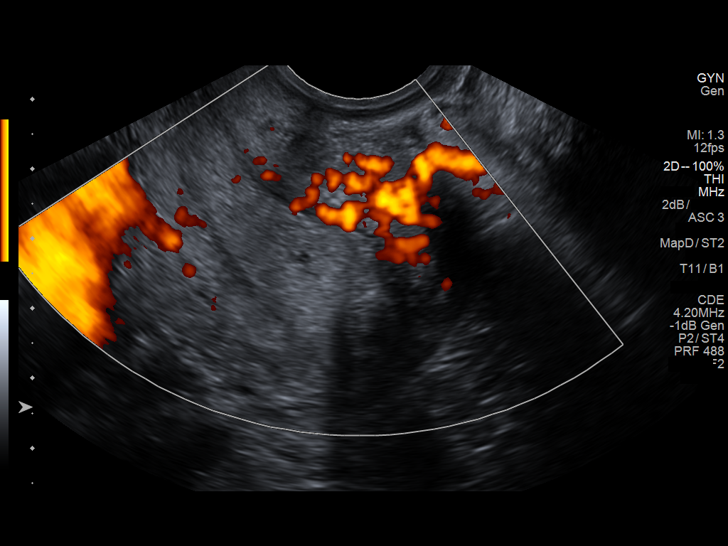
[im 60/90]
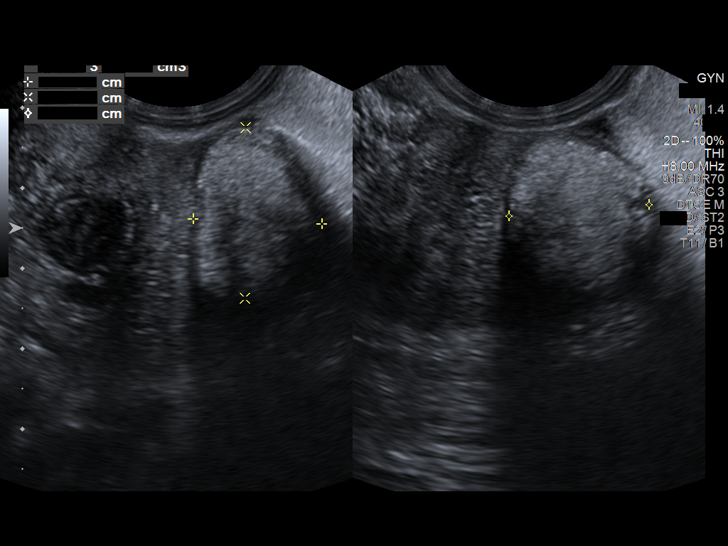
[im 67/90]
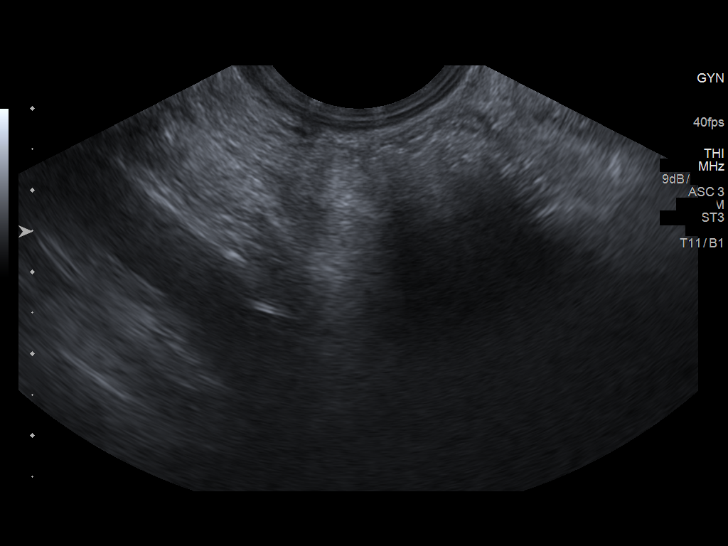
[im 75/90]
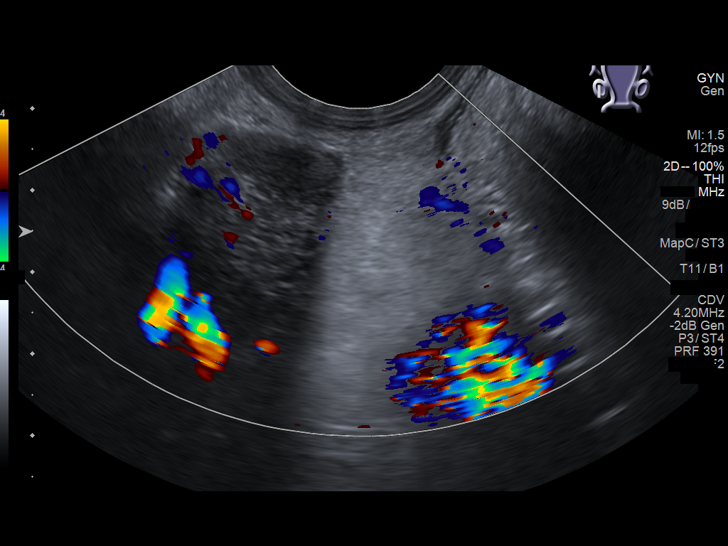
[im 82/90]
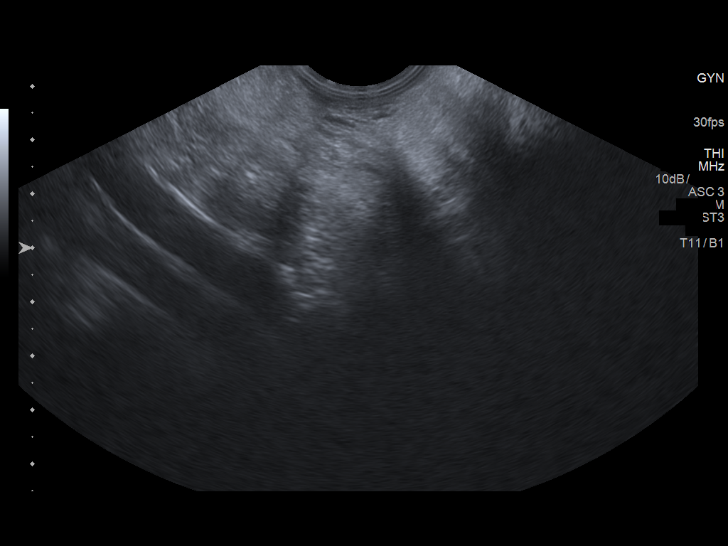
[im 90/90]
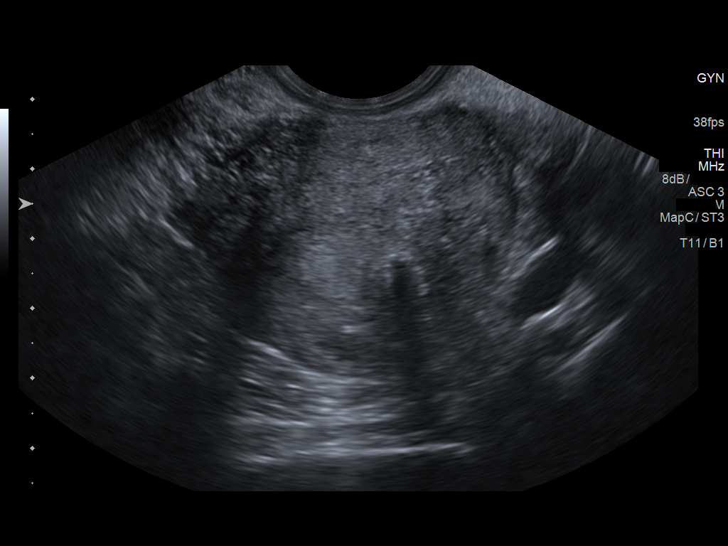

[13 of 25 positions shown; findings below may reference images not displayed]

FINDINGS: Uterus

Measurements: 7.6 x 3.1 x 4.1 cm. Retroverted. Mildly hyperechoic
nodule at LEFT fundus 1.6 x 2.1 x 1.8 cm without shadowing question
leiomyoma or lipoleiomyoma. Additional tiny potential leiomyomata 10
x 10 x 12 mm at LEFT uterus and 9 x 9 x 10 mm at fundus.

Endometrium

Thickness: Abnormally thickened for a postmenopausal patient at 15
mm thick. Small amount of endometrial fluid.

Right ovary

Measurements: 3.0 x 1.7 x 1.4 cm. Normal morphology without mass

Left ovary

Not visualized on either transabdominal or endovaginal imaging
question obscured by bowel gas

Other findings

Trace free pelvic fluid.

No adnexal masses.
IMPRESSION: Probable high uterine leiomyomas as above, largest potentially
containing fat.

Abnormal thickening of the endometrial complex for a postmenopausal
patient at 15 mm thick with a small amount of endometrial fluid.

In the setting of post-menopausal bleeding, endometrial sampling is
indicated to exclude carcinoma. If results are benign,
sonohysterogram should be considered for focal lesion work-up. (Ref:
Radiological Reasoning: Algorithmic Workup of Abnormal Vaginal
Bleeding with Endovaginal Sonography and Sonohysterography. AJR
[id number]; 191:[id number])

## 2018-05-24 ENCOUNTER — Ambulatory Visit
Admission: RE | Admit: 2018-05-24 | Discharge: 2018-05-24 | Disposition: A | Discharge status: Home / Self Care | Payer: Medicare Other | Source: Ambulatory Visit | Attending: Family Medicine | Admitting: Family Medicine

## 2018-05-24 DIAGNOSIS — Z13820 Encounter for screening for osteoporosis: Secondary | ICD-10-CM | POA: Diagnosis present

## 2018-05-31 ENCOUNTER — Other Ambulatory Visit: Payer: Self-pay | Admitting: Family Medicine

## 2018-05-31 DIAGNOSIS — Z1231 Encounter for screening mammogram for malignant neoplasm of breast: Secondary | ICD-10-CM

## 2018-07-31 ENCOUNTER — Other Ambulatory Visit: Payer: Self-pay | Admitting: Family Medicine

## 2018-07-31 ENCOUNTER — Ambulatory Visit
Admission: RE | Admit: 2018-07-31 | Discharge: 2018-07-31 | Disposition: A | Discharge status: Home / Self Care | Payer: Medicare Other | Source: Ambulatory Visit | Attending: Family Medicine | Admitting: Family Medicine

## 2018-07-31 DIAGNOSIS — Z1231 Encounter for screening mammogram for malignant neoplasm of breast: Secondary | ICD-10-CM

## 2019-08-20 ENCOUNTER — Other Ambulatory Visit: Payer: Self-pay | Admitting: Family Medicine

## 2019-08-20 DIAGNOSIS — Z1231 Encounter for screening mammogram for malignant neoplasm of breast: Secondary | ICD-10-CM

## 2019-09-04 ENCOUNTER — Ambulatory Visit
Admission: RE | Admit: 2019-09-04 | Discharge: 2019-09-04 | Disposition: A | Discharge status: Home / Self Care | Payer: Medicare HMO | Source: Ambulatory Visit | Attending: Family Medicine | Admitting: Family Medicine

## 2019-09-04 DIAGNOSIS — Z1231 Encounter for screening mammogram for malignant neoplasm of breast: Secondary | ICD-10-CM | POA: Insufficient documentation

## 2019-09-12 ENCOUNTER — Other Ambulatory Visit: Payer: Self-pay | Admitting: Family Medicine

## 2019-09-12 DIAGNOSIS — R531 Weakness: Secondary | ICD-10-CM

## 2019-09-27 ENCOUNTER — Ambulatory Visit: Payer: Medicaid Other

## 2019-10-03 ENCOUNTER — Ambulatory Visit
Admission: RE | Admit: 2019-10-03 | Discharge: 2019-10-03 | Disposition: A | Discharge status: Home / Self Care | Payer: Medicare HMO | Source: Ambulatory Visit | Attending: Family Medicine | Admitting: Family Medicine

## 2019-10-03 ENCOUNTER — Other Ambulatory Visit: Payer: Self-pay

## 2019-10-03 DIAGNOSIS — R531 Weakness: Secondary | ICD-10-CM | POA: Insufficient documentation

## 2019-10-28 ENCOUNTER — Other Ambulatory Visit
Admission: RE | Admit: 2019-10-28 | Discharge: 2019-10-28 | Disposition: A | Discharge status: Home / Self Care | Payer: Medicare HMO | Source: Ambulatory Visit | Attending: General Surgery | Admitting: General Surgery

## 2019-10-28 ENCOUNTER — Other Ambulatory Visit: Payer: Self-pay

## 2019-10-28 DIAGNOSIS — Z20822 Contact with and (suspected) exposure to covid-19: Secondary | ICD-10-CM | POA: Diagnosis not present

## 2019-10-28 DIAGNOSIS — Z01812 Encounter for preprocedural laboratory examination: Secondary | ICD-10-CM | POA: Insufficient documentation

## 2019-10-28 LAB — SARS CORONAVIRUS 2 (TAT 6-24 HRS): SARS Coronavirus 2: NEGATIVE

## 2019-10-29 ENCOUNTER — Encounter: Payer: Self-pay | Admitting: General Surgery

## 2019-10-30 ENCOUNTER — Ambulatory Visit
Admission: RE | Admit: 2019-10-30 | Discharge: 2019-10-30 | Disposition: A | Discharge status: Home / Self Care | Payer: Medicare HMO | Attending: General Surgery | Admitting: General Surgery

## 2019-10-30 ENCOUNTER — Encounter: Payer: Self-pay | Admitting: General Surgery

## 2019-10-30 ENCOUNTER — Ambulatory Visit: Payer: Medicare HMO | Admitting: Anesthesiology

## 2019-10-30 ENCOUNTER — Other Ambulatory Visit: Payer: Self-pay

## 2019-10-30 ENCOUNTER — Encounter
Admission: RE | Disposition: A | Discharge status: Home / Self Care | Payer: Self-pay | Source: Home / Self Care | Attending: General Surgery

## 2019-10-30 DIAGNOSIS — K317 Polyp of stomach and duodenum: Secondary | ICD-10-CM | POA: Insufficient documentation

## 2019-10-30 DIAGNOSIS — E119 Type 2 diabetes mellitus without complications: Secondary | ICD-10-CM | POA: Diagnosis not present

## 2019-10-30 DIAGNOSIS — K229 Disease of esophagus, unspecified: Secondary | ICD-10-CM | POA: Insufficient documentation

## 2019-10-30 DIAGNOSIS — Z8542 Personal history of malignant neoplasm of other parts of uterus: Secondary | ICD-10-CM | POA: Insufficient documentation

## 2019-10-30 DIAGNOSIS — Z6841 Body Mass Index (BMI) 40.0 and over, adult: Secondary | ICD-10-CM | POA: Insufficient documentation

## 2019-10-30 DIAGNOSIS — D509 Iron deficiency anemia, unspecified: Secondary | ICD-10-CM | POA: Diagnosis present

## 2019-10-30 DIAGNOSIS — Z79899 Other long term (current) drug therapy: Secondary | ICD-10-CM | POA: Insufficient documentation

## 2019-10-30 DIAGNOSIS — Z7984 Long term (current) use of oral hypoglycemic drugs: Secondary | ICD-10-CM | POA: Diagnosis not present

## 2019-10-30 DIAGNOSIS — I10 Essential (primary) hypertension: Secondary | ICD-10-CM | POA: Insufficient documentation

## 2019-10-30 DIAGNOSIS — Z7982 Long term (current) use of aspirin: Secondary | ICD-10-CM | POA: Diagnosis not present

## 2019-10-30 DIAGNOSIS — Z85038 Personal history of other malignant neoplasm of large intestine: Secondary | ICD-10-CM | POA: Insufficient documentation

## 2019-10-30 DIAGNOSIS — Z9884 Bariatric surgery status: Secondary | ICD-10-CM | POA: Diagnosis not present

## 2019-10-30 DIAGNOSIS — Z791 Long term (current) use of non-steroidal anti-inflammatories (NSAID): Secondary | ICD-10-CM | POA: Diagnosis not present

## 2019-10-30 DIAGNOSIS — Z880 Allergy status to penicillin: Secondary | ICD-10-CM | POA: Diagnosis not present

## 2019-10-30 DIAGNOSIS — Z885 Allergy status to narcotic agent status: Secondary | ICD-10-CM | POA: Diagnosis not present

## 2019-10-30 DIAGNOSIS — K219 Gastro-esophageal reflux disease without esophagitis: Secondary | ICD-10-CM | POA: Diagnosis not present

## 2019-10-30 DIAGNOSIS — Z853 Personal history of malignant neoplasm of breast: Secondary | ICD-10-CM | POA: Insufficient documentation

## 2019-10-30 DIAGNOSIS — M810 Age-related osteoporosis without current pathological fracture: Secondary | ICD-10-CM | POA: Diagnosis not present

## 2019-10-30 DIAGNOSIS — Q438 Other specified congenital malformations of intestine: Secondary | ICD-10-CM | POA: Insufficient documentation

## 2019-10-30 HISTORY — PX: COLONOSCOPY WITH PROPOFOL: SHX5780

## 2019-10-30 HISTORY — PX: ESOPHAGOGASTRODUODENOSCOPY (EGD) WITH PROPOFOL: SHX5813

## 2019-10-30 LAB — GLUCOSE, CAPILLARY: Glucose-Capillary: 113 mg/dL — ABNORMAL HIGH (ref 70–99)

## 2019-10-30 SURGERY — ESOPHAGOGASTRODUODENOSCOPY (EGD) WITH PROPOFOL
Anesthesia: General

## 2019-10-30 MED ORDER — SODIUM CHLORIDE 0.9 % IV SOLN: INTRAVENOUS | Status: DC

## 2019-10-30 MED ORDER — PROPOFOL 500 MG/50ML IV EMUL
INTRAVENOUS | Status: DC | PRN
  Administered 2019-10-30: 140 ug/kg/min via INTRAVENOUS

## 2019-10-30 MED ORDER — PROPOFOL 10 MG/ML IV BOLUS
INTRAVENOUS | Status: DC | PRN
  Administered 2019-10-30: 70 mg via INTRAVENOUS

## 2019-10-30 MED ORDER — LIDOCAINE HCL (CARDIAC) PF 100 MG/5ML IV SOSY
PREFILLED_SYRINGE | INTRAVENOUS | Status: DC | PRN
  Administered 2019-10-30: 100 mg via INTRAVENOUS

## 2019-10-30 MED ORDER — GLYCOPYRROLATE 0.2 MG/ML IJ SOLN
INTRAMUSCULAR | Status: DC | PRN
  Administered 2019-10-30: .2 mg via INTRAVENOUS

## 2019-10-30 NOTE — Anesthesia Preprocedure Evaluation (Signed)
Anesthesia Evaluation  Patient identified by MRN, date of birth, ID band Patient awake    Reviewed: Allergy & Precautions, NPO status , Patient's Chart, lab work & pertinent test results  History of Anesthesia Complications Negative for: history of anesthetic complications  Airway Mallampati: III  TM Distance: >3 FB Neck ROM: Full    Dental  (+) Poor Dentition   Pulmonary neg pulmonary ROS, neg sleep apnea, neg COPD,    breath sounds clear to auscultation- rhonchi (-) wheezing      Cardiovascular hypertension, Pt. on medications (-) CAD, (-) Past MI, (-) Cardiac Stents and (-) CABG  Rhythm:Regular Rate:Normal - Systolic murmurs and - Diastolic murmurs    Neuro/Psych neg Seizures negative neurological ROS  negative psych ROS   GI/Hepatic Neg liver ROS, hiatal hernia, GERD  ,  Endo/Other  diabetes, Oral Hypoglycemic Agents  Renal/GU negative Renal ROS     Musculoskeletal  (+) Arthritis ,   Abdominal (+) + obese,   Peds  Hematology  (+) anemia ,   Anesthesia Other Findings Past Medical History: No date: Breast cancer (HCC)     Comment:  left breast mastectomy No date: Breast cancer ([place]) No date: Carpal tunnel syndrome     Comment:  Bilateral No date: 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[place]) No date: 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[place]) No date: Colon cancer ([place]) No date: Dysrhythmia No date: Edema     Comment:  FEET AND LEGS No date: GERD (gastroesophageal reflux disease) No date: Heart murmur No date: History of hiatal hernia No date: History of tachycardia No date: Hypertension No date: Iron deficiency anemia No date: Morbid obesity ([place]) No date: 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[place]) No date: Osteomyelitis of ankle and foot (HCC)     Comment:  Bilateral No date: Osteoporosis, post-menopausal No date: Shortness of breath dyspnea No date: Type 2 diabetes mellitus without complication  (HCC) No date: Valvular heart disease   Reproductive/Obstetrics                             Anesthesia Physical Anesthesia Plan  ASA: III  Anesthesia Plan: General   Post-op Pain Management:    Induction: Intravenous  PONV Risk Score and Plan: 2 and Propofol infusion  Airway Management Planned: Natural Airway  Additional Equipment:   Intra-op Plan:   Post-operative Plan:   Informed Consent: I have reviewed the patients History and Physical, chart, labs and discussed the procedure including the risks, benefits and alternatives for the proposed anesthesia with the patient or authorized representative who has indicated his/her understanding and acceptance.     Dental advisory given  Plan Discussed with: CRNA and Anesthesiologist  Anesthesia Plan Comments:         Anesthesia Quick Evaluation

## 2019-10-30 NOTE — Op Note (Signed)
[facility] Patient Name: [name] Procedure Date: 10/30/2019 10:39 AM MRN: [id number] Account #: [id number] Date of Birth: 12-18-1950 Admit Type: Outpatient Age: 69 Room: [facility] ENDO ROOM 1 Gender: Female Note Status: Finalized Procedure:             Colonoscopy Indications:           Iron deficiency anemia Providers:             Robert Bellow, MD Referring MD:          Denton Lank MD, MD (Referring MD) Medicines:             Monitored Anesthesia Care Complications:         No immediate complications. Procedure:             Pre-Anesthesia Assessment:                        - Prior to the procedure, a History and Physical was                         performed, and patient medications, allergies and                         sensitivities were reviewed. The patient's tolerance                         of previous anesthesia was reviewed.                        - The risks and benefits of the procedure and the                         sedation options and risks were discussed with the                         patient. All questions were answered and informed                         consent was obtained.                        After obtaining informed consent, the colonoscope was                         passed under direct vision. Throughout the procedure,                         the patient's blood pressure, pulse, and oxygen                         saturations were monitored continuously. The                         Colonoscope was introduced through the anus and                         advanced to the the ileocolonic anastomosis. The                         colonoscopy was somewhat difficult due  to a tortuous                         colon. Successful completion of the procedure was                         aided by using manual pressure. The patient tolerated                         the procedure well. The quality of the bowel      preparation was adequate to identify polyps. Findings:      The entire examined colon appeared normal on direct and retroflexion       views. Impression:            - The entire examined colon is normal on direct and                         retroflexion views.                        - No specimens collected. Recommendation:        - Discharge patient to home (via wheelchair).                        - Resume previous diet indefinitely. Procedure Code(s):     --- Professional ---                        [phone], Colonoscopy, flexible; diagnostic, including                         collection of specimen(s) by brushing or washing, when                         performed (separate procedure) Diagnosis Code(s):     --- Professional ---                        D50.9, Iron deficiency anemia, unspecified CPT copyright 2019 American Medical Association. All rights reserved. The codes documented in this report are preliminary and upon coder review may  be revised to meet current compliance requirements. Robert Bellow, MD 10/30/2019 11:31:53 AM This report has been signed electronically. Number of Addenda: 0 Note Initiated On: 10/30/2019 10:39 AM Scope Withdrawal Time: 0 hours 7 minutes 1 second  Total Procedure Duration: 0 hours 15 minutes 10 seconds       [facility]

## 2019-10-30 NOTE — Transfer of Care (Signed)
Immediate Anesthesia Transfer of Care Note  Patient: [name]  Procedure(s) Performed: ESOPHAGOGASTRODUODENOSCOPY (EGD) WITH PROPOFOL (N/A ) COLONOSCOPY WITH PROPOFOL (N/A )  Patient Location: PACU  Anesthesia Type:General  Level of Consciousness: sedated  Airway & Oxygen Therapy: Patient Spontanous Breathing  Post-op Assessment: Report given to RN and Post -op Vital signs reviewed and stable  Post vital signs: Reviewed and stable  Last Vitals:  Vitals Value Taken Time  BP 124/62 10/30/19 1133  Temp    Pulse 86 10/30/19 1133  Resp 19 10/30/19 1133  SpO2 100 % 10/30/19 1133  Vitals shown include unvalidated device data.  Last Pain:  Vitals:   10/30/19 0949  TempSrc: Temporal  PainSc: 0-No pain         Complications: No apparent anesthesia complications

## 2019-10-30 NOTE — Op Note (Signed)
[facility] Gastroenterology Patient Name: [name] Procedure Date: 10/30/2019 10:40 AM MRN: [id number] Account #: [id number] Date of Birth: 18-Dec-1950 Admit Type: Outpatient Age: 69 Room: [facility] ENDO ROOM 1 Gender: Female Note Status: Finalized Procedure:             Upper GI endoscopy Indications:           Iron deficiency anemia Providers:             Robert Bellow, MD Referring MD:          Denton Lank MD, MD (Referring MD) Medicines:             Monitored Anesthesia Care Complications:         No immediate complications. Procedure:             Pre-Anesthesia Assessment:                        - Prior to the procedure, a History and Physical was                         performed, and patient medications, allergies and                         sensitivities were reviewed. The patient's tolerance                         of previous anesthesia was reviewed.                        - The risks and benefits of the procedure and the                         sedation options and risks were discussed with the                         patient. All questions were answered and informed                         consent was obtained.                        After obtaining informed consent, the endoscope was                         passed under direct vision. Throughout the procedure,                         the patient's blood pressure, pulse, and oxygen                         saturations were monitored continuously. The Endoscope                         was introduced through the mouth, and advanced to the                         second part of duodenum. The upper GI endoscopy was  accomplished without difficulty. The patient tolerated                         the procedure well. Findings:      Diffuse, white plaques were found in the middle third of the esophagus       and in the lower third of the esophagus. Biopsies were taken with a cold   forceps for histology.      Three 4 to 10 mm sessile polyps with no bleeding and no stigmata of       recent bleeding were found in the prepyloric region of the stomach.       Biopsies were taken with a cold forceps for histology.      The examined duodenum was normal. Impression:            - Esophageal plaques were found, suspicious for                         candidiasis. Biopsied.                        - Three gastric polyps. Biopsied.                        - Normal examined duodenum. Recommendation:        - Perform a colonoscopy today. Procedure Code(s):     --- Professional ---                        [phone], Esophagogastroduodenoscopy, flexible,                         transoral; with biopsy, single or multiple Diagnosis Code(s):     --- Professional ---                        K22.9, Disease of esophagus, unspecified                        D50.9, Iron deficiency anemia, unspecified                        K31.7, Polyp of stomach and duodenum CPT copyright 2019 [employer]. All rights reserved. The codes documented in this report are preliminary and upon coder review may  be revised to meet current compliance requirements. Robert Bellow, MD 10/30/2019 11:12:30 AM This report has been signed electronically. Number of Addenda: 0 Note Initiated On: 10/30/2019 10:40 AM Estimated Blood Loss:  Estimated blood loss: none.      [facility]

## 2019-10-30 NOTE — H&P (Signed)
[name] [id number] [birth year]      HPI:  Recently noted iron deficiency anemia.  Patient unaware of any blood per rectum.  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 Pat Patrick). Prior polyps at last colon several years ago with Dr. Tiffany Kocher.  For upper and lower endoscopy.   Medications Prior to Admission  Medication Sig Dispense Refill Last Dose  . amLODipine (NORVASC) 10 MG tablet Take 10 mg by mouth daily.   10/30/2019 at Unknown time  . ferrous sulfate 325 (65 FE) MG tablet Take 325 mg by mouth 3 (three) times daily with meals.     . magnesium hydroxide (MILK OF MAGNESIA) 400 MG/5ML suspension Take by mouth daily as needed for mild constipation.     . metoprolol (LOPRESSOR) 100 MG tablet Take 100 mg by mouth 2 (two) times daily.    10/30/2019 at Unknown time  . Sennosides-Docusate Sodium 8.6-50 MG CAPS Take by mouth daily.     Marland Kitchen acetaminophen (TYLENOL) 500 MG tablet Take 1,000 mg by mouth 2 (two) times daily.      . Alpha-Lipoic Acid 600 MG CAPS Take 1 capsule by mouth daily.     Marland Kitchen amitriptyline (ELAVIL) 50 MG tablet Take 50 mg by mouth at bedtime.     Marland Kitchen aspirin 81 MG tablet Take 81 mg by mouth daily.     Marland Kitchen atorvastatin (LIPITOR) 80 MG tablet Take 80 mg by mouth daily.     . Calcium 600-200 MG-UNIT tablet Take 1 tablet by mouth 2 (two) times daily.     Marland Kitchen dextromethorphan 15 MG/5ML syrup Take 5 mLs by mouth every 6 (six) hours as needed for cough.     . docusate sodium (COLACE) 100 MG capsule Take 200 mg by mouth daily.     . Dulaglutide (TRULICITY) 3.54 SF/6.8LE SOPN Inject 1 Dose into the skin every Saturday.     . fluticasone (FLONASE) 50 MCG/ACT nasal spray Place 2 sprays into both nostrils daily.     Marland Kitchen gabapentin (NEURONTIN) 600 MG tablet Take by mouth 3 (three) times daily.      . Infant Care Products [facility] EX) Apply topically 2 times a day when needed for rash     . loratadine (CLARITIN) 10 MG tablet Take 10 mg by mouth daily.     Marland Kitchen lovastatin (MEVACOR)  40 MG tablet Take 80 mg by mouth at bedtime.     . meloxicam (MOBIC) 15 MG tablet Take 15 mg by mouth daily.      . Menthol, Topical Analgesic, (BIOFREEZE) 4 % GEL Apply 1 application topically 3 (three) times daily as needed (joint pain).      . metFORMIN (GLUCOPHAGE) 1000 MG tablet Take 1,000 mg by mouth 2 (two) times daily with a meal.     . omeprazole (PRILOSEC) 20 MG capsule Take 20 mg by mouth daily.     Marland Kitchen oxybutynin (DITROPAN XL) 15 MG 24 hr tablet Take 15 mg by mouth at bedtime.     . polyethylene glycol (MIRALAX / GLYCOLAX) packet Take 17 g by mouth every Monday, Wednesday, and Friday.     . polyvinyl alcohol (LIQUIFILM TEARS) 1.4 % ophthalmic solution Place 1 drop into both eyes 2 (two) times daily as needed for dry eyes.      Allergies  Allergen Reactions  . Codeine Phosphate [Codeine]     GI upset  . Penicillins     GI upset   Past Medical History:  Diagnosis Date  .  Breast cancer ([place])    left breast mastectomy  . Breast cancer ([place])   . Carpal tunnel syndrome    Bilateral  . 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[place])   . 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[place])   . Colon cancer ([place])   . Dysrhythmia   . Edema    FEET AND LEGS  . GERD (gastroesophageal reflux disease)   . Heart murmur   . History of hiatal hernia   . History of tachycardia   . Hypertension   . Iron deficiency anemia   . Morbid obesity ([place])   . 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 Osteoporosis, post-menopausal   . Shortness of breath dyspnea   . Type 2 diabetes mellitus without complication ([place])   . Valvular heart disease    Past Surgical History:  Procedure Laterality Date  . BREAST REDUCTION SURGERY Right   . BREAST SURGERY    . CATARACT EXTRACTION W/PHACO Right 01/26/2016   Procedure: CATARACT EXTRACTION PHACO AND INTRAOCULAR LENS PLACEMENT (IOC);  Surgeon: Birder Robson, MD;  Location: [facility] ORS;  Service:  Ophthalmology;  Laterality: Right;  [place]  00:26 AP% 21.5 CDE 5.70 Fluid pack lot # [id number]  . COLON SURGERY     RESECTION  . COLONOSCOPY    . COLONOSCOPY WITH PROPOFOL N/A 09/07/2015   Procedure: COLONOSCOPY WITH PROPOFOL;  Surgeon: Manya Silvas, MD;  Location: [facility] ENDOSCOPY;  Service: Endoscopy;  Laterality: N/A;  . DILATION AND CURETTAGE OF UTERUS    . ESOPHAGOGASTRODUODENOSCOPY    . EYE SURGERY Bilateral    Cataract Extraction with IOL  . FOOT AMPUTATION Right    heel  . HYSTEROSCOPY WITH D & C N/A 02/06/2017   Procedure: DILATATION AND CURETTAGE /HYSTEROSCOPY;  Surgeon: Rubie Maid, MD;  Location: [facility] ORS;  Service: Gynecology;  Laterality: N/A;  . INCISION AND DRAINAGE Bilateral    FOOT LESIONS  . KNEE ARTHROSCOPY Left   . KNEE ARTHROSCOPY Left   . LAPAROSCOPIC RIGHT COLECTOMY  2002  . MASTECTOMY Left 1992   Left mastectomy with right breast reduction for breast cancer  . OTHER SURGICAL HISTORY     Gastric Sleeve  . REDUCTION MAMMAPLASTY Right    breast reduction on RIGHT after left mastectomy  . right ring trigger finger release    . SLEEVE GASTROPLASTY    . TONSILLECTOMY     Social History   Socioeconomic History  . Marital status: Legally Separated    Spouse name: Not on file  . Number of children: Not on file  . Years of education: Not on file  . Highest education level: Not on file  Occupational History  . Occupation: reitred.    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 Smoking status: Never Smoker  . Smokeless tobacco: Never Used  Substance and Sexual Activity  . Alcohol use: No  . Drug use: No  . Sexual activity: Not Currently    Birth control/protection: None  Other Topics Concern  . Not on file  Social History Narrative  . Not on file   Social Determinants of Health   Financial Resource Strain:   . Difficulty of Paying Living Expenses:   Food Insecurity:   . Worried About Sales executive in the Last Year:   . Arboriculturist in the Last Year:   [employer]  Needs:   . Lack of Transportation (Medical):   Marland Kitchen Lack of Transportation (Non-Medical):   Physical Activity:   . Days of Exercise per Week:   . Minutes of Exercise per Session:   Stress:   . Feeling of Stress :   Social Connections:   . Frequency of Communication with Friends and Family:   . Frequency of Social Gatherings with Friends and Family:   . Attends Religious Services:   . Active Member of Clubs or Organizations:   . Attends Archivist Meetings:   Marland Kitchen Marital Status:   Intimate Partner Violence:   . Fear of Current or Ex-Partner:   . Emotionally Abused:   Marland Kitchen Physically Abused:   . Sexually Abused:    Social History   Social History Narrative  . Not on file     ROS: Negative.     PE: HEENT: Negative. Lungs: Clear. Cardio: RR.Marland Kitchen  Assessment/Plan:  Proceed with planned endoscopy.  [facility] 10/30/2019

## 2019-10-30 NOTE — Anesthesia Postprocedure Evaluation (Signed)
Anesthesia Post Note  Patient: BANITA LEHN  Procedure(s) Performed: ESOPHAGOGASTRODUODENOSCOPY (EGD) WITH PROPOFOL (N/A ) COLONOSCOPY WITH PROPOFOL (N/A )  Patient location during evaluation: Endoscopy Anesthesia Type: General Level of consciousness: awake and alert and oriented Pain management: pain level controlled Vital Signs Assessment: post-procedure vital signs reviewed and stable Respiratory status: spontaneous breathing, nonlabored ventilation and respiratory function stable Cardiovascular status: blood pressure returned to baseline and stable Postop Assessment: no signs of nausea or vomiting Anesthetic complications: no     Last Vitals:  Vitals:   10/30/19 1132 10/30/19 1152  BP:  (!) 148/70  Pulse:    Resp:    Temp: (!) 36.3 C   SpO2:      Last Pain:  Vitals:   10/30/19 1152  TempSrc:   PainSc: 0-No pain                 Marilynn Ekstein

## 2019-10-31 ENCOUNTER — Encounter: Payer: Self-pay | Admitting: General Surgery

## 2019-10-31 LAB — SURGICAL PATHOLOGY

## 2020-02-17 ENCOUNTER — Other Ambulatory Visit: Payer: Self-pay

## 2020-02-17 ENCOUNTER — Emergency Department
Admission: EM | Admit: 2020-02-17 | Discharge: 2020-02-17 | Disposition: A | Discharge status: Home / Self Care | Payer: Medicare HMO | Attending: Emergency Medicine | Admitting: Emergency Medicine

## 2020-02-17 ENCOUNTER — Emergency Department: Payer: Medicare HMO

## 2020-02-17 ENCOUNTER — Encounter: Payer: Self-pay | Admitting: Emergency Medicine

## 2020-02-17 DIAGNOSIS — W19XXXA Unspecified fall, initial encounter: Secondary | ICD-10-CM | POA: Diagnosis not present

## 2020-02-17 DIAGNOSIS — S39012A Strain of muscle, fascia and tendon of lower back, initial encounter: Secondary | ICD-10-CM | POA: Insufficient documentation

## 2020-02-17 DIAGNOSIS — T148XXA Other injury of unspecified body region, initial encounter: Secondary | ICD-10-CM

## 2020-02-17 DIAGNOSIS — M47816 Spondylosis without myelopathy or radiculopathy, lumbar region: Secondary | ICD-10-CM | POA: Diagnosis not present

## 2020-02-17 DIAGNOSIS — Z7984 Long term (current) use of oral hypoglycemic drugs: Secondary | ICD-10-CM | POA: Insufficient documentation

## 2020-02-17 DIAGNOSIS — Z7982 Long term (current) use of aspirin: Secondary | ICD-10-CM | POA: Insufficient documentation

## 2020-02-17 DIAGNOSIS — Z79899 Other long term (current) drug therapy: Secondary | ICD-10-CM | POA: Diagnosis not present

## 2020-02-17 DIAGNOSIS — E119 Type 2 diabetes mellitus without complications: Secondary | ICD-10-CM | POA: Diagnosis not present

## 2020-02-17 DIAGNOSIS — I10 Essential (primary) hypertension: Secondary | ICD-10-CM | POA: Insufficient documentation

## 2020-02-17 LAB — URINALYSIS, COMPLETE (UACMP) WITH MICROSCOPIC
Bacteria, UA: NONE SEEN
Bilirubin Urine: NEGATIVE
Glucose, UA: NEGATIVE mg/dL
Hgb urine dipstick: NEGATIVE
Ketones, ur: NEGATIVE mg/dL
Leukocytes,Ua: NEGATIVE
Nitrite: NEGATIVE
Protein, ur: NEGATIVE mg/dL
Specific Gravity, Urine: 1.012 (ref 1.005–1.030)
pH: 8 (ref 5.0–8.0)

## 2020-02-17 MED ORDER — TRAMADOL HCL 50 MG PO TABS
50.0000 mg | ORAL_TABLET | Freq: Once | ORAL | Status: AC
  Administered 2020-02-17: 50 mg via ORAL
  Filled 2020-02-17: qty 1

## 2020-02-17 MED ORDER — LIDOCAINE 5 % EX PTCH
1.0000 | MEDICATED_PATCH | CUTANEOUS | Status: DC
  Administered 2020-02-17: 1 via TRANSDERMAL
  Filled 2020-02-17: qty 1

## 2020-02-17 MED ORDER — BACLOFEN 5 MG PO TABS: 5.0000 mg | ORAL_TABLET | Freq: Every evening | ORAL | 0 refills | Status: AC | PRN

## 2020-02-17 MED ORDER — LIDOCAINE 5 % EX PTCH: 1.0000 | MEDICATED_PATCH | Freq: Two times a day (BID) | CUTANEOUS | 0 refills | Status: AC

## 2020-02-17 NOTE — ED Triage Notes (Signed)
Pt in via [facility], reports fall at end of August with worsening lower back pain since the fall.  Vitals WDL, NAD noted at this time.

## 2020-02-17 NOTE — ED Triage Notes (Signed)
Pt in via EMS from [facility] with c/o fall. Pt fell at the end of August and has had increased left flank pain since Wednesday. 97%RA, 152 sys BP palp, FSBS 102, 99.2 temp. Allergic to PCN and codiene. Taking tylenol for pain every 4 hours since Wednesday

## 2020-02-17 NOTE — ED Provider Notes (Signed)
[facility] Emergency Department Provider Note  ____________________________________________  Time seen: Approximately 12:55 PM  I have reviewed the triage vital signs and the nursing notes.   HISTORY  Chief Complaint Back Pain    HPI [name] is a 69 y.o. female that presents to the emergency department for evaluation of left low back pain for 5 days.  Patient denies any recent trauma but did fall at the end of August.  She has been taking Tylenol every 4 hours since Wednesday.  Sometimes it helps her pain and sometimes it does not.  Her pain improved yesterday.  Her pain was worse this morning when she woke up.  Pain does not radiate.  No fever, nausea, vomiting, abdominal pain, numbness, tingling, weakness.  Past Medical History:  Diagnosis Date  . Breast cancer ([place])    left breast mastectomy  . Breast cancer ([place])   . Carpal tunnel syndrome    Bilateral  . 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[place])   . 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[place])   . Colon cancer ([place])   . Dysrhythmia   . Edema    FEET AND LEGS  . GERD (gastroesophageal reflux disease)   . Heart murmur   . History of hiatal hernia   . History of tachycardia   . Hypertension   . Iron deficiency anemia   . Morbid obesity ([place])   . 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 Osteoporosis, post-menopausal   . Shortness of breath dyspnea   . Type 2 diabetes mellitus without complication ([place])   . Valvular heart disease     Patient Active Problem List   Diagnosis Date Noted  . Postmenopausal bleeding 02/21/2018  . 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[place]) 12/24/2016  . Diabetic foot ulcer ([place]) 12/24/2016  . Electrocardiogram abnormal 12/24/2016  . Hypercholesterolemia 12/24/2016  . Hypomagnesemia 12/24/2016  . Joint pain 12/24/2016  . Morbid obesity ([place]) 12/24/2016  . 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 Ventral incisional hernia 12/24/2016  . Diabetes mellitus ([place]) 11/27/2013  . Essential hypertension 11/27/2013  . Mixed hyperlipidemia 11/27/2013    Past Surgical History:  Procedure Laterality Date  . BREAST REDUCTION SURGERY Right   . BREAST SURGERY    . CATARACT EXTRACTION W/PHACO Right 01/26/2016   Procedure: CATARACT EXTRACTION PHACO AND INTRAOCULAR LENS PLACEMENT (IOC);  Surgeon: Birder Robson, MD;  Location: [facility] ORS;  Service: Ophthalmology;  Laterality: Right;  [place]  00:26 AP% 21.5 CDE 5.70 Fluid pack lot # [id number]  . COLON SURGERY     RESECTION  . COLONOSCOPY    . COLONOSCOPY WITH PROPOFOL N/A 09/07/2015   Procedure: COLONOSCOPY WITH PROPOFOL;  Surgeon: Manya Silvas, MD;  Location: [facility] ENDOSCOPY;  Service: Endoscopy;  Laterality: N/A;  . COLONOSCOPY WITH PROPOFOL N/A 10/30/2019   Procedure: COLONOSCOPY WITH PROPOFOL;  Surgeon: Robert Bellow, MD;  Location: [facility] ENDOSCOPY;  Service: Gastroenterology;  Laterality: N/A;  . DILATION AND CURETTAGE OF UTERUS    . ESOPHAGOGASTRODUODENOSCOPY    . ESOPHAGOGASTRODUODENOSCOPY (EGD) WITH PROPOFOL N/A 10/30/2019   Procedure: ESOPHAGOGASTRODUODENOSCOPY (EGD) WITH PROPOFOL;  Surgeon: Robert Bellow, MD;  Location: [facility] ENDOSCOPY;  Service: Gastroenterology;  Laterality: N/A;  . EYE SURGERY Bilateral    Cataract Extraction with IOL  . FOOT AMPUTATION Right    heel  . HYSTEROSCOPY WITH D & C N/A 02/06/2017  Procedure: DILATATION AND CURETTAGE /HYSTEROSCOPY;  Surgeon: Rubie Maid, MD;  Location: [facility] ORS;  Service: Gynecology;  Laterality: N/A;  . INCISION AND DRAINAGE Bilateral    FOOT LESIONS  . KNEE ARTHROSCOPY Left   . KNEE ARTHROSCOPY Left   . LAPAROSCOPIC RIGHT COLECTOMY  2002  . MASTECTOMY Left 1992   Left mastectomy with right breast reduction for breast cancer  . OTHER SURGICAL HISTORY     Gastric Sleeve  . REDUCTION MAMMAPLASTY Right    breast reduction on RIGHT after left mastectomy  . right  ring trigger finger release    . SLEEVE GASTROPLASTY    . TONSILLECTOMY      Prior to Admission medications   Medication Sig Start Date End Date Taking? Authorizing Provider  acetaminophen (TYLENOL) 500 MG tablet Take 1,000 mg by mouth 2 (two) times daily.     [provider]  Alpha-Lipoic Acid 600 MG CAPS Take 1 capsule by mouth daily.    [provider]  amitriptyline (ELAVIL) 50 MG tablet Take 50 mg by mouth at bedtime.    [provider]  amLODipine (NORVASC) 10 MG tablet Take 10 mg by mouth daily.    [provider]  aspirin 81 MG tablet Take 81 mg by mouth daily.    [provider]  atorvastatin (LIPITOR) 80 MG tablet Take 80 mg by mouth daily.    [provider]  Baclofen 5 MG TABS Take 5 mg by mouth at bedtime as needed. 02/17/20   Laban Emperor, PA-C  Calcium 600-200 MG-UNIT tablet Take 1 tablet by mouth 2 (two) times daily.    [provider]  dextromethorphan 15 MG/5ML syrup Take 5 mLs by mouth every 6 (six) hours as needed for cough.    [provider]  docusate sodium (COLACE) 100 MG capsule Take 200 mg by mouth daily.    [provider]  Dulaglutide (TRULICITY) 5.95 GL/8.7FI SOPN Inject 1 Dose into the skin every Saturday.    [provider]  ferrous sulfate 325 (65 FE) MG tablet Take 325 mg by mouth 3 (three) times daily with meals.    [provider]  fluticasone (FLONASE) 50 MCG/ACT nasal spray Place 2 sprays into both nostrils daily.    [provider]  gabapentin (NEURONTIN) 600 MG tablet Take by mouth 3 (three) times daily.     [provider]  Infant Care Products [facility] EX) Apply topically 2 times a day when needed for rash    [provider]  lidocaine (LIDODERM) 5 % Place 1 patch onto the skin every 12 (twelve) hours. Remove & Discard patch within 12 hours or as directed by MD 02/17/20 02/16/21  Laban Emperor, PA-C  loratadine (CLARITIN) 10  MG tablet Take 10 mg by mouth daily.    [provider]  lovastatin (MEVACOR) 40 MG tablet Take 80 mg by mouth at bedtime.    [provider]  magnesium hydroxide (MILK OF MAGNESIA) 400 MG/5ML suspension Take by mouth daily as needed for mild constipation.    [provider]  meloxicam (MOBIC) 15 MG tablet Take 15 mg by mouth daily.     [provider]  Menthol, Topical Analgesic, (BIOFREEZE) 4 % GEL Apply 1 application topically 3 (three) times daily as needed (joint pain).     [provider]  metFORMIN (GLUCOPHAGE) 1000 MG tablet Take 1,000 mg by mouth 2 (two) times daily with a meal.    [provider]  metoprolol (  LOPRESSOR) 100 MG tablet Take 100 mg by mouth 2 (two) times daily.     [provider]  omeprazole (PRILOSEC) 20 MG capsule Take 20 mg by mouth daily.    [provider]  oxybutynin (DITROPAN XL) 15 MG 24 hr tablet Take 15 mg by mouth at bedtime.    [provider]  polyethylene glycol (MIRALAX / GLYCOLAX) packet Take 17 g by mouth every Monday, Wednesday, and Friday.    [provider]  polyvinyl alcohol (LIQUIFILM TEARS) 1.4 % ophthalmic solution Place 1 drop into both eyes 2 (two) times daily as needed for dry eyes.    [provider]  Sennosides-Docusate Sodium 8.6-50 MG CAPS Take by mouth daily.    [provider]    Allergies Codeine phosphate [codeine] and Penicillins  Family History  Problem Relation Age of Onset  . Hypertension Mother   . Hypertension Father   . Hypertension Sister   . Breast cancer Neg Hx     Social History Social History   Tobacco Use  . Smoking status: Never Smoker  . Smokeless tobacco: Never Used  Vaping Use  . Vaping Use: Never used  Substance Use Topics  . Alcohol use: No  . Drug use: No     Review of Systems  Constitutional: No fever/chills Cardiovascular: No chest pain. Respiratory: No SOB. Gastrointestinal: No  abdominal pain.  No nausea, no vomiting.  Genitourinary: Negative for dysuria.  Negative for hematuria. Musculoskeletal: Positive for low back pain. Skin: Negative for rash, abrasions, lacerations, ecchymosis. Neurological: Negative for numbness or tingling   ____________________________________________   PHYSICAL EXAM:  VITAL SIGNS: ED Triage Vitals  Enc Vitals Group     BP 02/17/20 1200 (!) 177/79     Pulse Rate 02/17/20 1200 97     Resp 02/17/20 1200 20     Temp 02/17/20 1200 98.9 F (37.2 C)     Temp Source 02/17/20 1200 Oral     SpO2 02/17/20 1200 97 %     Weight 02/17/20 1201 285 lb (129.3 kg)     Height 02/17/20 1201 5\' 7"  (1.702 m)     Head Circumference --      Peak Flow --      Pain Score 02/17/20 1200 10     Pain Loc --      Pain Edu? --      Excl. in [place]? --      Constitutional: Alert and oriented. Well appearing and in no acute distress. Eyes: Conjunctivae are normal. PERRL. EOMI. Head: Atraumatic. ENT:      Ears:      Nose: No congestion/rhinnorhea.      Mouth/Throat: Mucous membranes are moist.  Neck: No stridor.  Cardiovascular: Normal rate, regular rhythm.  Good peripheral circulation. Respiratory: Normal respiratory effort without tachypnea or retractions. Lungs CTAB. Good air entry to the bases with no decreased or absent breath sounds. Gastrointestinal: Bowel sounds 4 quadrants. Soft and nontender to palpation. No guarding or rigidity. No palpable masses. No distention. No CVA tenderness. Musculoskeletal: Full range of motion to all extremities. No gross deformities appreciated. Neurologic:  Normal speech and language. No gross focal neurologic deficits are appreciated.  Tenderness to palpation to left lumbar paraspinal muscles.  No tenderness palpation over lumbar spine.  Strength equal in lower extremities bilaterally.  Ambulatory with walker. Skin:  Skin is warm, dry and intact. No rash noted. Psychiatric: Mood and affect are normal. Speech and  behavior are normal. Patient exhibits appropriate  insight and judgement.   ____________________________________________   LABS (all labs ordered are listed, but only abnormal results are displayed)  Labs Reviewed  URINALYSIS, COMPLETE (UACMP) WITH MICROSCOPIC - Abnormal; Notable for the following components:      Result Value   Color, Urine YELLOW (*)    APPearance CLEAR (*)    All other components within normal limits   ____________________________________________  EKG   ____________________________________________  RADIOLOGY Robinette Haines, personally viewed and evaluated these images (plain radiographs) as part of my medical decision making, as well as reviewing the written report by the radiologist.   DG Lumbar Spine 2-3 Views  Result Date: 02/17/2020 CLINICAL DATA:  Low back pain after fall. EXAM: LUMBAR SPINE - 2-3 VIEW COMPARISON:  None. FINDINGS: No fracture or spondylolisthesis is noted. Mild degenerative disc disease is noted at L3-4, L4-5 and L5-S1. Anterior osteophyte formation is noted at multiple levels. IMPRESSION: Mild multilevel degenerative disc disease. No acute abnormality is noted. Electronically Signed   By: Marijo Conception M.D.   On: 02/17/2020 13:31    ____________________________________________    PROCEDURES  Procedure(s) performed:    Procedures    Medications  lidocaine (LIDODERM) 5 % 1 patch (1 patch Transdermal Patch Applied 02/17/20 1348)  traMADol (ULTRAM) tablet 50 mg (50 mg Oral Given 02/17/20 1348)     ____________________________________________   INITIAL IMPRESSION / ASSESSMENT AND PLAN / ED COURSE  Pertinent labs & imaging results that were available during my care of the patient were reviewed by me and considered in my medical decision making (see chart for details).  Review of the [place] CSRS was performed in accordance of the [place] prior to dispensing any controlled drugs.   Patient's diagnosis is consistent with muscle  strain vital signs and exam are reassuring.  X-ray negative for acute bony abnormalities.  No hematuria or flank pain to suggest nephrolithiasis.  Pains improved significantly after a dose of tramadol. Patient uses a walker at home.  Patient will be discharged home with prescriptions for a short course of baclofen that she may take at bedtime. Patient is to follow up with primary care as directed. Patient is given ED precautions to return to the ED for any worsening or new symptoms.   [name] was evaluated in Emergency Department on 02/17/2020 for the symptoms described in the history of present illness. She was evaluated in the context of the global COVID-19 pandemic, which necessitated consideration that the patient might be at risk for infection with the SARS-CoV-2 virus that causes COVID-19. Institutional protocols and algorithms that pertain to the evaluation of patients at risk for COVID-19 are in a state of rapid change based on information released by regulatory bodies including the CDC and federal and state organizations. These policies and algorithms were followed during the patient's care in the ED.  ____________________________________________  FINAL CLINICAL IMPRESSION(S) / ED DIAGNOSES  Final diagnoses:  Muscle strain  Spondylosis of lumbar region without myelopathy or radiculopathy      NEW MEDICATIONS STARTED DURING THIS VISIT:  ED Discharge Orders         Ordered    Baclofen 5 MG TABS  At bedtime PRN        02/17/20 1446    lidocaine (LIDODERM) 5 %  Every 12 hours        02/17/20 1448              This chart was dictated using voice recognition software/Dragon. Despite best efforts to  proofread, errors can occur which can change the meaning. Any change was purely unintentional.    Laban Emperor, PA-C 02/17/20 1457    Lucrezia Starch, MD 02/17/20 1650

## 2020-02-17 NOTE — ED Notes (Signed)
See triage note  Presents with worsening lower back pain  States she had a fall in August Pain is worse

## 2020-02-17 NOTE — Discharge Instructions (Signed)
Your urine was clear without any signs of infection.  Your x-ray did not show a fracture.  You do have some arthritis on your back on your x-ray.  Please apply a heating pad to your back.  You can continue taking Tylenol for pain.  You can take the muscle relaxer at night to help relax your muscles.  This can make you drowsy.  Please call your primary care today for a follow-up appointment this week.

## 2020-07-30 ENCOUNTER — Other Ambulatory Visit: Payer: Self-pay | Admitting: Family Medicine

## 2020-07-30 DIAGNOSIS — Z1231 Encounter for screening mammogram for malignant neoplasm of breast: Secondary | ICD-10-CM

## 2020-09-09 ENCOUNTER — Ambulatory Visit
Admission: RE | Admit: 2020-09-09 | Discharge: 2020-09-09 | Disposition: A | Discharge status: Home / Self Care | Payer: 59 | Source: Ambulatory Visit | Attending: Family Medicine | Admitting: Family Medicine

## 2020-09-09 ENCOUNTER — Other Ambulatory Visit: Payer: Self-pay

## 2020-09-09 DIAGNOSIS — Z1231 Encounter for screening mammogram for malignant neoplasm of breast: Secondary | ICD-10-CM | POA: Insufficient documentation

## 2021-03-08 ENCOUNTER — Other Ambulatory Visit: Payer: Self-pay | Admitting: Family Medicine

## 2021-03-08 ENCOUNTER — Ambulatory Visit
Admission: RE | Admit: 2021-03-08 | Discharge: 2021-03-08 | Disposition: A | Discharge status: Home / Self Care | Payer: 59 | Source: Ambulatory Visit | Attending: Family Medicine | Admitting: Family Medicine

## 2021-03-08 ENCOUNTER — Ambulatory Visit
Admission: RE | Admit: 2021-03-08 | Discharge: 2021-03-08 | Disposition: A | Discharge status: Home / Self Care | Payer: 59 | Attending: Family Medicine | Admitting: Family Medicine

## 2021-03-08 DIAGNOSIS — M549 Dorsalgia, unspecified: Secondary | ICD-10-CM

## 2021-03-26 ENCOUNTER — Ambulatory Visit
Admission: RE | Admit: 2021-03-26 | Discharge: 2021-03-26 | Disposition: A | Discharge status: Home / Self Care | Payer: 59 | Source: Ambulatory Visit | Attending: Family Medicine | Admitting: Family Medicine

## 2021-03-26 ENCOUNTER — Other Ambulatory Visit: Payer: Self-pay | Admitting: Family Medicine

## 2021-03-26 DIAGNOSIS — M549 Dorsalgia, unspecified: Secondary | ICD-10-CM

## 2021-06-30 ENCOUNTER — Other Ambulatory Visit: Payer: Self-pay | Admitting: Family Medicine

## 2021-06-30 DIAGNOSIS — Z1231 Encounter for screening mammogram for malignant neoplasm of breast: Secondary | ICD-10-CM

## 2021-09-10 ENCOUNTER — Other Ambulatory Visit: Payer: Self-pay

## 2021-09-10 ENCOUNTER — Emergency Department: Payer: 59

## 2021-09-10 ENCOUNTER — Emergency Department
Admission: EM | Admit: 2021-09-10 | Discharge: 2021-09-10 | Disposition: A | Discharge status: Skilled Nursing Facility | Payer: 59 | Attending: Emergency Medicine | Admitting: Emergency Medicine

## 2021-09-10 DIAGNOSIS — I10 Essential (primary) hypertension: Secondary | ICD-10-CM | POA: Diagnosis not present

## 2021-09-10 DIAGNOSIS — W19XXXA Unspecified fall, initial encounter: Secondary | ICD-10-CM

## 2021-09-10 DIAGNOSIS — S300XXA Contusion of lower back and pelvis, initial encounter: Secondary | ICD-10-CM

## 2021-09-10 DIAGNOSIS — W01198A Fall on same level from slipping, tripping and stumbling with subsequent striking against other object, initial encounter: Secondary | ICD-10-CM | POA: Insufficient documentation

## 2021-09-10 DIAGNOSIS — S3992XA Unspecified injury of lower back, initial encounter: Secondary | ICD-10-CM | POA: Diagnosis present

## 2021-09-10 DIAGNOSIS — S0990XA Unspecified injury of head, initial encounter: Secondary | ICD-10-CM | POA: Diagnosis not present

## 2021-09-10 DIAGNOSIS — S40011A Contusion of right shoulder, initial encounter: Secondary | ICD-10-CM | POA: Diagnosis not present

## 2021-09-10 DIAGNOSIS — E119 Type 2 diabetes mellitus without complications: Secondary | ICD-10-CM | POA: Insufficient documentation

## 2021-09-10 MED ORDER — HYDROCODONE-ACETAMINOPHEN 5-325 MG PO TABS: 1.0000 | ORAL_TABLET | Freq: Four times a day (QID) | ORAL | 0 refills | Status: AC | PRN

## 2021-09-10 MED ORDER — HYDROCODONE-ACETAMINOPHEN 5-325 MG PO TABS
1.0000 | ORAL_TABLET | Freq: Once | ORAL | Status: AC
  Administered 2021-09-10: 1 via ORAL
  Filled 2021-09-10: qty 1

## 2021-09-10 NOTE — ED Notes (Signed)
[place] froze so pt unable to sign for d/c paperwork; pt verbal she must go back via EMS.  ?

## 2021-09-10 NOTE — ED Notes (Signed)
250cc of urine emptied from canister. Pt given her phone and jacket.  ?

## 2021-09-10 NOTE — ED Notes (Signed)
Pt was assisted with cleaning and changing of depend. The pt was placed on a purwick for urinary incontinence.  ?

## 2021-09-10 NOTE — ED Triage Notes (Signed)
Pt to ED from "[place]" home for fall last night around 1700. No complains then, was seen by EMS at that time. Pt states that her feet slipped from under her while sitting on toilet, fell on buttocks. ? ?Now complaining of lower back pain, 10/10, shooting down R leg. Pt has boot on R foot for diabetic ulcer to R heel. ? ?153/83, 96% RA, 84 HR ?

## 2021-09-10 NOTE — Discharge Instructions (Addendum)
Follow-up with your primary care provider if any continued problems or concerns.  A prescription for hydrocodone with Tylenol was sent to your pharmacy to take as needed for moderate to severe pain.  Do not take additional Tylenol with this medication.  You may use ice to your muscles as needed for discomfort.  You can plan on being more sore for the next 4 to 5 days due to your fall.  X-rays were negative for acute fracture but did show degenerative arthritis.  Your CT of your head was negative for a head bleed or skull fracture. ?

## 2021-09-10 NOTE — ED Provider Notes (Signed)
? ?[facility] ?Provider Note ? ? ? Event Date/Time  ? First MD Initiated Contact with Patient 09/10/21 1008   ?  (approximate) ? ? ?History  ? ?Fall ? ? ?HPI ? ?[name] is a 71 y.o. female presents to the ED EMS with complaint of a fall that occurred last evening approximately 5 PM.  Patient states that she did hit her head but there was no loss of consciousness.  Patient states that her feet slipped out from under her while she was sitting on the toilet.  She denies any headache, nausea, vomiting.  Patient states that she landed on her buttocks and now has low back pain and right shoulder/arm pain.  Patient denies any specific blood thinners but does take an aspirin a day.  Patient has a history of hypertension, hyperlipidemia, diabetes,charcot foot due to diabetes.  She rates her pain as a 10/10. ?  ? ? ?Physical Exam  ? ?Triage Vital Signs: ?ED Triage Vitals  ?Enc Vitals Group  ?   BP 09/10/21 0938 (!) 148/86  ?   Pulse Rate 09/10/21 0938 81  ?   Resp 09/10/21 0938 16  ?   Temp 09/10/21 0938 99.1 ?F (37.3 ?C)  ?   Temp Source 09/10/21 0938 Oral  ?   SpO2 09/10/21 0938 100 %  ?   Weight 09/10/21 0939 263 lb (119.3 kg)  ?   Height 09/10/21 0939 '5\' 7"'$  (1.702 m)  ?   Head Circumference --   ?   Peak Flow --   ?   Pain Score 09/10/21 0938 10  ?   Pain Loc --   ?   Pain Edu? --   ?   Excl. in [place]? --   ? ? ?Most recent vital signs: ?Vitals:  ? 09/10/21 1245 09/10/21 1513  ?BP:  (!) 141/81  ?Pulse: 78 87  ?Resp:  19  ?Temp:    ?SpO2: 97% 99%  ? ? ? ?General: Awake, no distress.  Patient is alert, oriented and able to answer questions appropriately. ?CV:  Good peripheral perfusion.  Heart regular rate and rhythm. ?Resp:  Normal effort.  Lungs are clear bilaterally. ?Abd:  No distention.  Soft, nontender, bowel sounds normoactive x4 quadrants. ?Other:  No scalp tenderness noted.  Nontender cervical spine.  Right shoulder diffusely tender with no crepitus noted.  Range of motion is slow and  guarded secondary to discomfort.  Skin is intact.  No discoloration is seen.  There is tenderness on palpation of the lumbar spine but no step-offs are appreciated and no muscle spasms are noted during exam.  No point tenderness on compression of the hips.  No lower extremity shortening or rotation was noted. ? ? ?ED Results / Procedures / Treatments  ? ?Labs ?(all labs ordered are listed, but only abnormal results are displayed) ?Labs Reviewed - No data to display ? ? ? ?RADIOLOGY ?CT head without contrast per radiologist is negative for any acute intracranial changes.  Chronic prevascular changes noted. ?Right shoulder x-ray is negative for fracture or dislocation. ?Lumbar spine x-ray is negative for acute fracture but does show multilevel degenerative disc disease. ?Pelvic exam images were reviewed and no fracture is noted.  Radiology reports are read on the right shoulder, lumbar and pelvis.  No acute bony abnormalities were noted with these exams. ? ? ? ?PROCEDURES: ? ?Critical Care performed:  ? ?Procedures ? ? ?MEDICATIONS ORDERED IN ED: ?Medications  ?HYDROcodone-acetaminophen (NORCO/VICODIN) 5-325 MG per  tablet 1 tablet (1 tablet Oral Given 09/10/21 1251)  ? ? ? ?IMPRESSION / MDM / ASSESSMENT AND PLAN / ED COURSE  ?I reviewed the triage vital signs and the nursing notes. ? ? ?Differential diagnosis includes, but is not limited to, head injury secondary to fall, back pain chronic with exacerbation, compression fracture, pelvic fracture, right shoulder fracture/dislocation. ? ?71 year old female presents to the ED via EMS after she fell at a nursing facility last evening.  Patient states that her feet slipped from her while she was sitting on the toilet and she fell on her buttocks.  Patient denies any known head injury but no history of this being witnessed was obtained.  Patient does take an aspirin per day and will she was made aware that this counts as a blood thinner.  CT scan of her head was negative for  any acute changes.  X-rays of her right shoulder, pelvis and lumbar spine were taken and patient was made aware that she does have degenerative disc disease but no acute fractures.  Patient was reassured.  A prescription for hydrocodone-acetaminophen was sent to the pharmacy for her to take every 6 hours if needed for pain.  Patient otherwise remained talkative and appropriate while waiting for transfer back to the nursing facility.  She was made aware that she would be sore for several days and also to follow-up with her PCP if any continued problems. ? ?  ? ? ?FINAL CLINICAL IMPRESSION(S) / ED DIAGNOSES  ? ?Final diagnoses:  ?Lumbar contusion, initial encounter  ?Contusion of buttock, initial encounter  ?Contusion of right shoulder, initial encounter  ?Fall, initial encounter  ? ? ? ?Rx / DC Orders  ? ?ED Discharge Orders   ? ?      Ordered  ?  HYDROcodone-acetaminophen (NORCO/VICODIN) 5-325 MG tablet  Every 6 hours PRN       ? 09/10/21 1151  ? ?  ?  ? ?  ? ? ? ?Note:  This document was prepared using Dragon voice recognition software and may include unintentional dictation errors. ?  ?Johnn Hai, PA-C ?09/10/21 1629 ? ?  ?Nena Polio, MD ?09/10/21 1633 ? ?

## 2021-09-10 NOTE — ED Notes (Addendum)
Pericare provided, [place] placed, briefs changed, pt repositioned by this RN and [employer]  ?

## 2021-09-10 NOTE — ED Notes (Signed)
[facility]  CALLED  TO  TRANSPORT  PT ?

## 2021-10-29 ENCOUNTER — Ambulatory Visit: Payer: 59 | Admitting: Physician Assistant

## 2021-11-04 ENCOUNTER — Inpatient Hospital Stay: Admission: RE | Admit: 2021-11-04 | Payer: 59 | Source: Ambulatory Visit

## 2021-11-08 ENCOUNTER — Ambulatory Visit: Payer: 59 | Admitting: Physician Assistant

## 2021-11-12 ENCOUNTER — Encounter: Admission: RE | Payer: Self-pay | Source: Home / Self Care

## 2021-11-12 ENCOUNTER — Ambulatory Visit: Admission: RE | Admit: 2021-11-12 | Payer: 59 | Source: Home / Self Care | Admitting: Podiatry

## 2021-11-12 SURGERY — OSTEOTOMY, CALCANEUS
Anesthesia: Choice | Laterality: Right

## 2021-11-15 ENCOUNTER — Encounter: Payer: 59 | Attending: Physician Assistant | Admitting: Physician Assistant

## 2021-11-15 DIAGNOSIS — Z6838 Body mass index (BMI) 38.0-38.9, adult: Secondary | ICD-10-CM | POA: Insufficient documentation

## 2021-11-15 DIAGNOSIS — I10 Essential (primary) hypertension: Secondary | ICD-10-CM | POA: Insufficient documentation

## 2021-11-15 DIAGNOSIS — E11621 Type 2 diabetes mellitus with foot ulcer: Secondary | ICD-10-CM | POA: Diagnosis present

## 2021-11-15 DIAGNOSIS — L97512 Non-pressure chronic ulcer of other part of right foot with fat layer exposed: Secondary | ICD-10-CM | POA: Insufficient documentation

## 2021-11-15 DIAGNOSIS — E1143 Type 2 diabetes mellitus with diabetic autonomic (poly)neuropathy: Secondary | ICD-10-CM | POA: Insufficient documentation

## 2021-11-16 ENCOUNTER — Other Ambulatory Visit: Payer: Self-pay | Admitting: Physician Assistant

## 2021-11-16 ENCOUNTER — Other Ambulatory Visit (HOSPITAL_COMMUNITY): Payer: Self-pay | Admitting: Physician Assistant

## 2021-11-16 DIAGNOSIS — L97512 Non-pressure chronic ulcer of other part of right foot with fat layer exposed: Secondary | ICD-10-CM

## 2021-11-25 ENCOUNTER — Other Ambulatory Visit: Payer: 59

## 2021-11-26 ENCOUNTER — Ambulatory Visit
Admission: RE | Admit: 2021-11-26 | Discharge: 2021-11-26 | Disposition: A | Discharge status: Home / Self Care | Payer: 59 | Source: Ambulatory Visit | Attending: Family Medicine | Admitting: Family Medicine

## 2021-11-26 DIAGNOSIS — Z1231 Encounter for screening mammogram for malignant neoplasm of breast: Secondary | ICD-10-CM | POA: Diagnosis present

## 2021-11-29 ENCOUNTER — Ambulatory Visit
Admission: RE | Admit: 2021-11-29 | Discharge: 2021-11-29 | Disposition: A | Discharge status: Home / Self Care | Payer: 59 | Source: Ambulatory Visit | Attending: Physician Assistant | Admitting: Physician Assistant

## 2021-11-29 ENCOUNTER — Encounter: Payer: 59 | Attending: Physician Assistant | Admitting: Physician Assistant

## 2021-11-29 DIAGNOSIS — L97512 Non-pressure chronic ulcer of other part of right foot with fat layer exposed: Secondary | ICD-10-CM | POA: Insufficient documentation

## 2021-11-29 DIAGNOSIS — E1143 Type 2 diabetes mellitus with diabetic autonomic (poly)neuropathy: Secondary | ICD-10-CM | POA: Diagnosis not present

## 2021-11-29 DIAGNOSIS — E11621 Type 2 diabetes mellitus with foot ulcer: Secondary | ICD-10-CM | POA: Insufficient documentation

## 2021-11-29 DIAGNOSIS — Z6838 Body mass index (BMI) 38.0-38.9, adult: Secondary | ICD-10-CM | POA: Diagnosis not present

## 2021-11-29 DIAGNOSIS — I10 Essential (primary) hypertension: Secondary | ICD-10-CM | POA: Insufficient documentation

## 2021-11-29 MED ORDER — GADOBUTROL 1 MMOL/ML IV SOLN
10.0000 mL | Freq: Once | INTRAVENOUS | Status: AC | PRN
  Administered 2021-11-29: 10 mL via INTRAVENOUS

## 2021-11-29 NOTE — Progress Notes (Addendum)
[name] ([id number]) Visit Report for 11/29/2021 Arrival Information Details Patient Name: [name], [name]. Date of Service: 11/29/2021 8:30 AM Medical Record Number: [id number] Patient Account Number: [id number] Date of Birth/Sex: Jan 09, 1951 (71 y.o. F) Treating RN: Levora Dredge Primary Care Wyllow Seigler: Denton Lank Other Clinician: Referring Elvie Maines: Denton Lank Treating Semone Orlov/Extender: Skipper Cliche in Treatment: 2 Visit Information History Since Last Visit Added or deleted any medications: No Patient Arrived: Wheel Chair Any new allergies or adverse reactions: No Arrival Time: 08:28 Had a fall or experienced change in No Accompanied By: self activities of daily living that may affect Transfer Assistance: EasyPivot Patient Lift risk of falls: Patient Identification Verified: Yes Hospitalized since last visit: No Secondary Verification Process Completed: Yes Has Dressing in Place as Prescribed: Yes Patient Has Alerts: Yes Pain Present Now: No Patient Alerts: Patient on Blood Thinner ABI 11/15/21 L 1.17 R 1.27 Electronic Signature(s) Signed: 11/29/2021 4:25:46 PM By: Levora Dredge Entered By: Levora Dredge on 11/29/2021 08:40:20 [name], [name] ([id number]) -------------------------------------------------------------------------------- Clinic Level of Care Assessment Details Patient Name: [name], [name] Date of Service: 11/29/2021 8:30 AM Medical Record Number: [id number] Patient Account Number: [id number] Date of Birth/Sex: 08/18/1950 (71 y.o. F) Treating RN: Levora Dredge Primary Care Flint Hakeem: Denton Lank Other Clinician: Referring Kassidee Narciso: Denton Lank Treating Reona Zendejas/Extender: Skipper Cliche in Treatment: 2 Clinic Level of Care Assessment Items TOOL 1 Quantity Score []  - Use when EandM and Procedure is performed on INITIAL visit 0 ASSESSMENTS - Nursing Assessment / Reassessment []  - General Physical Exam (combine w/ comprehensive assessment  (listed just below) when performed on new 0 pt. evals) []  - 0 Comprehensive Assessment (HX, ROS, Risk Assessments, Wounds Hx, etc.) ASSESSMENTS - Wound and Skin Assessment / Reassessment []  - Dermatologic / Skin Assessment (not related to wound area) 0 ASSESSMENTS - Ostomy and/or Continence Assessment and Care []  - Incontinence Assessment and Management 0 []  - 0 Ostomy Care Assessment and Management (repouching, etc.) PROCESS - Coordination of Care []  - Simple Patient / Family Education for ongoing care 0 []  - 0 Complex (extensive) Patient / Family Education for ongoing care []  - 0 Staff obtains Programmer, systems, Records, Test Results / Process Orders []  - 0 Staff telephones HHA, Nursing Homes / Clarify orders / etc []  - 0 Routine Transfer to another Facility (non-emergent condition) []  - 0 Routine Hospital Admission (non-emergent condition) []  - 0 New Admissions / Biomedical engineer / Ordering NPWT, Apligraf, etc. []  - 0 Emergency Hospital Admission (emergent condition) PROCESS - Special Needs []  - Pediatric / Minor Patient Management 0 []  - 0 Isolation Patient Management []  - 0 Hearing / Language / Visual special needs []  - 0 Assessment of Community assistance (transportation, D/C planning, etc.) []  - 0 Additional assistance / Altered mentation []  - 0 Support Surface(s) Assessment (bed, cushion, seat, etc.) INTERVENTIONS - Miscellaneous []  - External ear exam 0 []  - 0 Patient Transfer (multiple staff / Civil Service fast streamer / Similar devices) []  - 0 Simple Staple / Suture removal (25 or less) []  - 0 Complex Staple / Suture removal (26 or more) []  - 0 Hypo/Hyperglycemic Management (do not check if billed separately) []  - 0 Ankle / Brachial Index (ABI) - do not check if billed separately Has the patient been seen at the hospital within the last three years: Yes Total Score: 0 Level Of Care: ____ [name] ([id number]) Electronic Signature(s) Signed: 11/29/2021 4:25:46  PM By: Levora Dredge Entered By: Levora Dredge on 11/29/2021 11:30:43 [name], [name] ([id number]) --------------------------------------------------------------------------------  Encounter Discharge Information Details Patient Name: [name], [name] Date of Service: 11/29/2021 8:30 AM Medical Record Number: [id number] Patient Account Number: [id number] Date of Birth/Sex: Dec 27, 1950 (71 y.o. F) Treating RN: Levora Dredge Primary Care Lateshia Schmoker: Denton Lank Other Clinician: Referring Pansy Ostrovsky: Denton Lank Treating Filiberto Wamble/Extender: Skipper Cliche in Treatment: 2 Encounter Discharge Information Items Post Procedure Vitals Discharge Condition: Stable Temperature (F): 98 Ambulatory Status: Wheelchair Pulse (bpm): 80 Discharge Destination: Other (Note Required) Respiratory Rate (breaths/min): 18 Telephoned: No Blood Pressure (mmHg): 145/78 Orders Sent: Yes Transportation: Other Accompanied By: self Schedule Follow-up Appointment: Yes Clinical Summary of Care: Electronic Signature(s) Signed: 11/29/2021 11:32:00 AM By: Levora Dredge Entered By: Levora Dredge on 11/29/2021 11:32:00 [name], [name] -------------------------------------------------------------------------------- Lower Extremity Assessment Details Patient Name: [name], [name] Date of Service: 11/29/2021 8:30 AM Medical Record Number: [id number] Patient Account Number: [id number] Date of Birth/Sex: 12/15/1950 (71 y.o. F) Treating RN: Levora Dredge Primary Care Londell Noll: Denton Lank Other Clinician: Referring Wynne Rozak: Denton Lank Treating Kaitlynd Phillips/Extender: Jeri Cos Weeks in Treatment: 2 Edema Assessment Assessed: [Left: No] [Right: No] Edema: [Left: Ye] [Right: s] Calf Left: Right: Point of Measurement: 31 cm From Medial Instep 43.5 cm Ankle Left: Right: Point of Measurement: 10 cm From Medial Instep 24 cm Vascular Assessment Pulses: Dorsalis Pedis Palpable:  [Left:Yes] Electronic Signature(s) Signed: 11/29/2021 4:25:46 PM By: Levora Dredge Entered By: Levora Dredge on 11/29/2021 08:49:42 [name], [name] ([id number]) -------------------------------------------------------------------------------- Multi Wound Chart Details Patient Name: Cawthorn, Alaija F. Date of Service: 11/29/2021 8:30 AM Medical Record Number: [id number] Patient Account Number: [id number] Date of Birth/Sex: 01-23-1951 (71 y.o. F) Treating RN: Levora Dredge Primary Care Felicie Kocher: Denton Lank Other Clinician: Referring Earvin Blazier: Denton Lank Treating Eusebia Grulke/Extender: Jeri Cos Weeks in Treatment: 2 Vital Signs Height(in): 4 Pulse(bpm): 32 Weight(lbs): 248 Blood Pressure(mmHg): 145/78 Body Mass Index(BMI): 38.8 Temperature(F): 98 Respiratory Rate(breaths/min): 18 Photos: [N/A:N/A] Wound Location: Right, Plantar Foot N/A N/A Wounding Event: Pressure Injury N/A N/A Primary Etiology: Diabetic Wound/Ulcer of the Lower N/A N/A Extremity Comorbid History: Cataracts, Anemia, Hypertension, N/A N/A Type II Diabetes, Osteoarthritis, Osteomyelitis, Neuropathy, Confinement Anxiety Date Acquired: 06/23/2021 N/A N/A Weeks of Treatment: 2 N/A N/A Wound Status: Open N/A N/A Wound Recurrence: No N/A N/A Measurements L x W x D (cm) 0.3x0.3x0.8 N/A N/A Area (cm) : 0.071 N/A N/A Volume (cm) : 0.057 N/A N/A % Reduction in Area: 0.00% N/A N/A % Reduction in Volume: -307.10% N/A N/A Starting Position 1 (o'clock): 12 Ending Position 1 (o'clock): 12 Maximum Distance 1 (cm): 1.3 Undermining: Yes N/A N/A Classification: Grade 2 N/A N/A Exudate Amount: Medium N/A N/A Exudate Type: Serosanguineous N/A N/A Exudate Color: red, brown N/A N/A Granulation Amount: Medium (34-66%) N/A N/A Granulation Quality: Pale N/A N/A Necrotic Amount: Medium (34-66%) N/A N/A Exposed Structures: Fat Layer (Subcutaneous Tissue): N/A N/A Yes Epithelialization: None N/A N/A Treatment  Notes Electronic Signature(s) Signed: 11/29/2021 4:25:46 PM By: Levora Dredge Entered By: Levora Dredge on 11/29/2021 09:02:56 [name], [name] ([id number]) -------------------------------------------------------------------------------- [place] Details Patient Name: [name], [name] Date of Service: 11/29/2021 8:30 AM Medical Record Number: [id number] Patient Account Number: [id number] Date of Birth/Sex: Sep 17, 1950 (71 y.o. F) Treating RN: Levora Dredge Primary Care Wyvonne Carda: Denton Lank Other Clinician: Referring Adonia Porada: Denton Lank Treating Ibrahem Volkman/Extender: Jeri Cos Weeks in Treatment: 2 Active Inactive Wound/Skin Impairment Nursing Diagnoses: Impaired tissue integrity Knowledge deficit related to ulceration/compromised skin integrity Goals: Ulcer/skin breakdown will have a volume reduction of 30% by week 4 Date Initiated: 11/15/2021 Target Resolution Date: 12/13/2021 Goal Status: Active  Ulcer/skin breakdown will have a volume reduction of 50% by week 8 Date Initiated: 11/15/2021 Target Resolution Date: 01/10/2022 Goal Status: Active Ulcer/skin breakdown will have a volume reduction of 80% by week 12 Date Initiated: 11/15/2021 Target Resolution Date: 02/07/2022 Goal Status: Active Ulcer/skin breakdown will heal within 14 weeks Date Initiated: 11/15/2021 Target Resolution Date: 02/21/2022 Goal Status: Active Interventions: Assess patient/caregiver ability to obtain necessary supplies Assess patient/caregiver ability to perform ulcer/skin care regimen upon admission and as needed Assess ulceration(s) every visit Provide education on ulcer and skin care Notes: Electronic Signature(s) Signed: 11/29/2021 4:25:46 PM By: Levora Dredge Entered By: Levora Dredge on 11/29/2021 09:02:47 [name], [name] ([id number]) -------------------------------------------------------------------------------- Pain Assessment Details Patient Name: [name], Daisy  F. Date of Service: 11/29/2021 8:30 AM Medical Record Number: [id number] Patient Account Number: [id number] Date of Birth/Sex: 11-Sep-1950 (71 y.o. F) Treating RN: Levora Dredge Primary Care Shenouda Genova: Denton Lank Other Clinician: Referring Cohen Boettner: Denton Lank Treating Delvecchio Madole/Extender: Jeri Cos Weeks in Treatment: 2 Active Problems Location of Pain Severity and Description of Pain Patient Has Paino No Site Locations Rate the pain. Current Pain Level: 0 Pain Management and Medication Current Pain Management: Electronic Signature(s) Signed: 11/29/2021 4:25:46 PM By: Levora Dredge Entered By: Levora Dredge on 11/29/2021 08:40:45 Lair, Adaeze F. ([id number]) -------------------------------------------------------------------------------- Patient/Caregiver Education Details Patient Name: [name], [name] Date of Service: 11/29/2021 8:30 AM Medical Record Number: [id number] Patient Account Number: [id number] Date of Birth/Gender: 11/24/1950 (71 y.o. F) Treating RN: Levora Dredge Primary Care Physician: Denton Lank Other Clinician: Referring Physician: Denton Lank Treating Physician/Extender: Skipper Cliche in Treatment: 2 Education Assessment Education Provided To: Patient Education Topics Provided Wound Debridement: Handouts: Wound Debridement Methods: Explain/Verbal Responses: State content correctly Wound/Skin Impairment: Handouts: Caring for Your Ulcer Methods: Explain/Verbal Responses: State content correctly Electronic Signature(s) Signed: 11/29/2021 4:25:46 PM By: Levora Dredge Entered By: Levora Dredge on 11/29/2021 11:31:02 [name], [name] ([id number]) -------------------------------------------------------------------------------- Wound Assessment Details Patient Name: [name], [name] Date of Service: 11/29/2021 8:30 AM Medical Record Number: [id number] Patient Account Number: [id number] Date of Birth/Sex: 1950-08-01 (71 y.o. F) Treating RN: Levora Dredge Primary Care Haeley Fordham: Denton Lank Other Clinician: Referring Royston Bekele: Denton Lank Treating Miley Lindon/Extender: Jeri Cos Weeks in Treatment: 2 Wound Status Wound Number: 5 Primary Diabetic Wound/Ulcer of the Lower Extremity Etiology: Wound Location: Right, Plantar Foot Wound Open Wounding Event: Pressure Injury Status: Date Acquired: 06/23/2021 Comorbid Cataracts, Anemia, Hypertension, Type II Diabetes, Weeks Of Treatment: 2 History: Osteoarthritis, Osteomyelitis, Neuropathy, Confinement Clustered Wound: No Anxiety Photos Wound Measurements Length: (cm) 0.3 Width: (cm) 0.3 Depth: (cm) 0.8 Area: (cm) 0.071 Volume: (cm) 0.057 % Reduction in Area: 0% % Reduction in Volume: -307.1% Epithelialization: None Tunneling: No Undermining: Yes Starting Position (o'clock): 12 Ending Position (o'clock): 12 Maximum Distance: (cm) 1.3 Wound Description Classification: Grade 2 Exudate Amount: Medium Exudate Type: Serosanguineous Exudate Color: red, brown Foul Odor After Cleansing: No Slough/Fibrino Yes Wound Bed Granulation Amount: Medium (34-66%) Exposed Structure Granulation Quality: Pale Fat Layer (Subcutaneous Tissue) Exposed: Yes Necrotic Amount: Medium (34-66%) Necrotic Quality: Adherent Slough Treatment Notes Wound #5 (Foot) Wound Laterality: Plantar, Right Cleanser [place] Ancillary Kit - 15 Day Supply Discharge Instruction: Use supplies as instructed; Kit contains: (15) Saline Bullets; (15) 3x3 Gauze; 15 pr Gloves Soap and Water Bellissimo, Demaris F. ([id number]) Discharge Instruction: Gently cleanse wound with antibacterial soap, rinse and pat dry prior to dressing wounds Peri-Wound Care Topical Primary Dressing Hydrofera Blue Classic Foam Rope Dressing, 9x6 (mm/in) Discharge Instruction: Cut rope in half, insert rope at 7-8 o"clock  towards heel, cut at 4 cm length and insert. Please leave a tail hanging out in order to remove. wet end with saline so it is  flexible Secondary Dressing (SILICONE BORDER) Zetuvit Plus SILICONE BORDER Dressing 4x4 (in/in) Discharge Instruction: Please do not put silicone bordered dressings under wraps. Use non-bordered dressing only. Secured With Compression Wrap Compression Stockings Add-Ons Electronic Signature(s) Signed: 11/29/2021 4:25:46 PM By: Levora Dredge Entered By: Levora Dredge on 11/29/2021 08:48:56 Vallecillo, Mariana F. ([id number]) -------------------------------------------------------------------------------- Vitals Details Patient Name: Pau, Kealohilani F. Date of Service: 11/29/2021 8:30 AM Medical Record Number: [id number] Patient Account Number: [id number] Date of Birth/Sex: 1951-05-21 (71 y.o. F) Treating RN: Levora Dredge Primary Care Sonali Wivell: Denton Lank Other Clinician: Referring Phyllip Claw: Denton Lank Treating Tanee Henery/Extender: Jeri Cos Weeks in Treatment: 2 Vital Signs Time Taken: 08:35 Temperature (F): 98 Height (in): 67 Pulse (bpm): 80 Weight (lbs): 248 Respiratory Rate (breaths/min): 18 Body Mass Index (BMI): 38.8 Blood Pressure (mmHg): 145/78 Reference Range: 80 - 120 mg / dl Electronic Signature(s) Signed: 11/29/2021 4:25:46 PM By: Levora Dredge Entered By: Levora Dredge on 11/29/2021 08:40:38

## 2021-11-29 NOTE — Progress Notes (Addendum)
LOVETA, DELLIS ([id number]) Visit Report for 11/29/2021 Chief Complaint Document Details Patient Name: [name], [name] Date of Service: 11/29/2021 8:30 AM Medical Record Number: [id number] Patient Account Number: [id number] Date of Birth/Sex: June 22, 1950 (71 y.o. F) Treating RN: Levora Dredge Primary Care Provider: Denton Lank Other Clinician: Referring Provider: Denton Lank Treating Provider/Extender: Jeri Cos Weeks in Treatment: 2 Information Obtained from: Patient Chief Complaint Right Foot Ulcer Electronic Signature(s) Signed: 11/29/2021 8:30:54 AM By: Worthy Keeler PA-C Entered By: Worthy Keeler on 11/29/2021 08:30:54 Procter, Laurren F. ([id number]) -------------------------------------------------------------------------------- Debridement Details Patient Name: [name], [name] Date of Service: 11/29/2021 8:30 AM Medical Record Number: [id number] Patient Account Number: [id number] Date of Birth/Sex: 01/15/1951 (71 y.o. F) Treating RN: Levora Dredge Primary Care Provider: Denton Lank Other Clinician: Referring Provider: Denton Lank Treating Provider/Extender: Jeri Cos Weeks in Treatment: 2 Debridement Performed for Wound #5 Right,Plantar Foot Assessment: Performed By: Physician Tommie Sams., PA-C Debridement Type: Debridement Severity of Tissue Pre Debridement: Fat layer exposed Level of Consciousness (Pre- Awake and Alert procedure): Pre-procedure Verification/Time Out Yes - 09:03 Taken: Pain Control: Lidocaine 4% Topical Solution Total Area Debrided (L x W): 0.3 (cm) x 0.3 (cm) = 0.09 (cm) Tissue and other material Viable, Non-Viable, Callus, Slough, Subcutaneous, Slough debrided: Level: Skin/Subcutaneous Tissue Debridement Description: Excisional Instrument: Curette Bleeding: Minimum Hemostasis Achieved: Pressure Response to Treatment: Procedure was tolerated well Level of Consciousness (Post- Awake and Alert procedure): Post Debridement  Measurements of Total Wound Length: (cm) 0.3 Width: (cm) 0.3 Depth: (cm) 0.8 Volume: (cm) 0.057 Character of Wound/Ulcer Post Debridement: Stable Severity of Tissue Post Debridement: Fat layer exposed Post Procedure Diagnosis Same as Pre-procedure Electronic Signature(s) Signed: 11/29/2021 4:25:46 PM By: Levora Dredge Signed: 11/29/2021 4:34:19 PM By: Worthy Keeler PA-C Entered By: Levora Dredge on 11/29/2021 09:04:42 Rispoli, Tasmine F. ([id number]) -------------------------------------------------------------------------------- HPI Details Patient Name: [name], [name] Date of Service: 11/29/2021 8:30 AM Medical Record Number: [id number] Patient Account Number: [id number] Date of Birth/Sex: 21-Apr-1951 (71 y.o. F) Treating RN: Levora Dredge Primary Care Provider: Denton Lank Other Clinician: Referring Provider: Denton Lank Treating Provider/Extender: Jeri Cos Weeks in Treatment: 2 History of Present Illness HPI Description: this is a 71 year old morbidly obese patient who has uncontrolled diabetes and has not been taking treatment for diabetes for at least 6 months. She presents with bilateral heel ulcers of only 2 weeks duration. He was sent to [place] by her PCP Dr. Veda Canning. After this the patient was seen both by the podiatrist Dr. Caryl Comes and her vascular surgeon Dr. Delana Meyer. from what I understand from the son Dr. Delana Meyer has done some process on her and a procedure and says that he has finished working on her and no further vascular intervention is to be done. the patient was sent was for a further evaluation of her wounds. Her son has been doing some dressings at home on a twice a daily basis with Silvadene ointment. Some recent blood work done on 07/19/2014 shows that her hemoglobin was 7.9 with a hematocrit of 25.7. Her white count was 12.7 and her glucose was 175 mg/dL. Her hemoglobin A1c was 8.4%. She has had no recent x-rays available for review. 07/31/2014. I was able  to review the office visit notes from Dr. Delana Meyer on 07/14/2014. The patient's ABI on the right was 0.85 and on the left was 0.89 and both were abnormal. His assessment and plan was that the patient had severe atherosclerotic changes of both lower extremities associated with ulceration and tissue loss  of the foot and this represent a limb threatening ischemia and he recommended a angiography of the lower extremities with hope for intervention for limb salvage. This was scheduled the procedure was done on 07/22/14 The surgery performed was an abdominal aortogram with the right lower extremity runoff and catheter placement. 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 08/07/2014 x-rays were done of both feet on March 10. The right heel showed soft tissue wound, with some calcaneus lesion suspicious for osteomyelitis. An MRI would be recommended. the left heel did not show any evidence of osteomyelitis. 08/14/2014 -- Reports on Kalayna Schul : Cardiology reports reviewed from the [facility] clinic by Dr. Nehemiah Massed 2-D echocardiography done on 08/09/2013 showed that her normal LV systolic function with ejection fraction of 55% She also had mild mitral and tricuspid insufficiency. 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 Culture report of the bone shows several organisms which are grown and she has been put on Bactrim DS one twice a day for 14 days. The patient's bariatric surgeon Dr. Duke Salvia did speak with me today regarding the application of ACell and suggested that he would be happy to apply this in  case the patient was ready for it. I had a detailed discussion with him regarding the patient's condition and I have recommended at the present time the patient would benefit from hyperbaric oxygen therapy, IV antibiotics and supportive wound care. if at some stage the wound was ready for application of a cell I would be happy to talk to him about taking over her care. He agreed with the treatment plan and said he would be in touch. Readmission: 11-15-2021 upon evaluation today patient presents for initial inspection here in our clinic concerning issues that she has been having with a wound on the plantar aspect of her right foot. I did review notes as well and it appears that she had a bone spur noted but no signs of osteomyelitis this was in May when she saw Dr. Caryl Comes. With that being said he apparently was recommended surgery to go in and remove the bone spur as he feels like that is the causative reason for the wound and indeed I feel like that may be the case as well based on what I am seeing. Fortunately I do not see any evidence of active infection locally or systemically right now although there is definitely some callus buildup and there is definitely more fluid trapped underneath that is hard to even tell which is what we are seeing currently working have to remove some of this callus in order to identify what is even going on underneath and how deep this really goes. The patient does have a history of diabetes mellitus type 2, diabetic neuropathy, and hypertension. Otherwise there are no major medical problems that would affect her wound healing. Her most recent hemoglobin A1c was on 06-16-2021 and is stated to be good at 6.7 11-29-2021 upon evaluation today patient appears to be doing well currently in regard to her wound. She has been tolerating the dressing changes without complication. Fortunately there  does not appear to be any evidence of active infection locally or systemically which is  great news. No fevers, chills, nausea, vomiting, or diarrhea. I do think the Hydrofera Blue rope is doing decently well. With that being said she does have a little callus that builds up I am after cleaning this away as well as cleaning some of the base of the wound as well. She does have her MRI scheduled for later today. Electronic Signature(s) Signed: 11/29/2021 9:35:39 AM By: Worthy Keeler PA-C Entered By: Worthy Keeler on 11/29/2021 09:35:38 Albo, Luva F. ([id number]) -------------------------------------------------------------------------------- Physical Exam Details Patient Name: [name], [name] Date of Service: 11/29/2021 8:30 AM Medical Record Number: [id number] Patient Account Number: [id number] Date of Birth/Sex: January 25, 1951 (71 y.o. F) Treating RN: Levora Dredge Primary Care Provider: Denton Lank Other Clinician: Referring Provider: Denton Lank Treating Provider/Extender: Jeri Cos Weeks in Treatment: 2 Constitutional Well-nourished and well-hydrated in no acute distress. Respiratory normal breathing without difficulty. Psychiatric this patient is able to make decisions and demonstrates good insight into disease process. Alert and Oriented x 3. pleasant and cooperative. Notes Upon inspection patient's wound bed actually showed signs of good granulation and epithelization at this point. Fortunately there does not appear to be any evidence of active infection locally or systemically which is great news. No fevers, chills, nausea, vomiting, or diarrhea. Electronic Signature(s) Signed: 11/29/2021 9:35:54 AM By: Worthy Keeler PA-C Entered By: Worthy Keeler on 11/29/2021 09:35:54 Pratt, Tacia F. ([id number]) -------------------------------------------------------------------------------- Physician Orders Details Patient Name: [name], [name] Date of Service: 11/29/2021 8:30 AM Medical Record Number: [id number] Patient Account Number: [id number] Date of Birth/Sex:  03-18-1951 (71 y.o. 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 o Nurse Visit as needed [place] for wound care. May utilize formulary equivalent dressing for wound treatment orders unless otherwise specified. Home Health Nurse may visit PRN to address patientos wound care needs. o Scheduled days for dressing changes to be completed; exception, patient has scheduled wound care visit that day. o **Please direct any NON-WOUND related issues/requests for orders to patient's Primary Care Physician. **If current dressing causes regression in wound condition, may D/C ordered dressing product/s and apply Normal Saline Moist Dressing daily until next [place] or Other MD appointment. **Notify [facility] of regression in wound condition at [phone]. o Other Home Health Orders/Instructions: - For the hydrofera blue rope, please insert at 7-8 o'clock towards the heel as this is the deepest area. Bathing/ Shower/ Hygiene o Wash wounds with antibacterial soap and water. o May shower; gently cleanse wound with antibacterial soap, rinse and pat dry prior to dressing wounds o No tub bath. Off-Loading o Other: - continue to wear crow walker boot to relieve pressure off wound Wound Treatment Wound #5 - Foot Wound Laterality: Plantar, Right Cleanser: Byram Ancillary Kit - 15 Day Supply 3 x Per  Week/30 Days Discharge Instructions: Use supplies as instructed; Kit contains: (15) Saline Bullets; (15) 3x3 Gauze; 15 pr Gloves Cleanser: Soap and Water 3 x Per Week/30  Days Discharge Instructions: Gently cleanse wound with antibacterial soap, rinse and pat dry prior to dressing wounds Primary Dressing: Hydrofera Blue Classic Foam Rope Dressing, 9x6 (mm/in) 3 x Per Week/30 Days Discharge Instructions: Cut rope in half, insert rope at 7-8 o"clock towards heel, cut at 4 cm length and insert. Please leave a tail hanging out in order to remove. wet end with saline so it is flexible Secondary Dressing: (SILICONE BORDER) Zetuvit Plus SILICONE BORDER Dressing 4x4 (in/in) 3 x Per Week/30 Days Discharge Instructions: Please do not put silicone bordered dressings under wraps. Use non-bordered dressing only. Electronic Signature(s) Signed: 11/29/2021 4:25:46 PM By: Levora Dredge Signed: 11/29/2021 4:34:19 PM By: Worthy Keeler PA-C Entered By: Levora Dredge on 11/29/2021 09:47:48 Marcel, Marykathleen F. ([id number]) -------------------------------------------------------------------------------- Problem List Details Patient Name: Mckenzie, [name] Date of Service: 11/29/2021 8:30 AM Medical Record Number: [id number] Patient Account Number: [id number] Date of Birth/Sex: 24-Jun-1950 (71 y.o. 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 ([id number]) -------------------------------------------------------------------------------- Progress Note Details Patient Name: [name], [name] Date of Service: 11/29/2021 8:30 AM Medical Record Number: [id number] Patient Account Number: [id number] Date of Birth/Sex: [birth year] (71 y.o. F) Treating RN: Levora Dredge Primary Care Provider: Denton Lank Other Clinician: Referring Provider: Denton Lank Treating Provider/Extender: Skipper Cliche in Treatment: 2 Subjective Chief Complaint Information obtained from Patient Right Foot Ulcer History of Present Illness (HPI) this is a 71 year old morbidly obese patient who has uncontrolled diabetes and has not been taking treatment for diabetes for at least 6 months. She presents with bilateral heel ulcers of only 2 weeks duration. He was sent to [place] by her PCP Dr. Veda Canning. After this the patient was seen both by the podiatrist Dr. Caryl Comes and her vascular surgeon Dr. Delana Meyer. from what I understand from the son Dr. Delana Meyer has done some process on her and a procedure and says that he has finished working on her and no further vascular intervention is to be done. the patient was sent was for a further evaluation of her wounds. Her son has been doing some dressings at home on a twice a daily basis with Silvadene ointment. Some recent blood work done on 07/19/2014 shows that her hemoglobin was 7.9 with a hematocrit of 25.7. Her white count was 12.7 and her glucose was 175 mg/dL. Her hemoglobin A1c was 8.4%. She has had no recent x-rays available for review. 07/31/2014. I was able to review the office visit notes from Dr. Delana Meyer on 07/14/2014. The patient's ABI on the right was 0.85 and on the left was 0.89 and both were abnormal. His assessment and plan was that the patient had severe atherosclerotic changes of  both lower extremities associated with ulceration and tissue loss of the foot and this represent a limb threatening ischemia and he recommended a angiography of the lower extremities with hope for intervention for limb salvage. This was scheduled the procedure was done on 07/22/14  The surgery performed was an abdominal aortogram with the right lower extremity runoff and catheter placement. 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 08/07/2014 x-rays were done of both feet on March 10. The right heel showed soft tissue wound, with some calcaneus lesion suspicious for osteomyelitis. An MRI would be recommended. the left heel did not show any evidence of osteomyelitis. 08/14/2014 -- Reports on Yohana Pikus : Cardiology reports reviewed from the [facility] clinic by Dr. Nehemiah Massed 2-D echocardiography done on 08/09/2013 showed that her normal LV systolic function with ejection fraction of 55% She also had mild mitral and tricuspid insufficiency. 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 Culture report of the bone shows several organisms which are grown and she has been put on Bactrim DS one twice a day for 14 days. The patient's bariatric surgeon Dr. Duke Salvia did speak with me today regarding the application of ACell and suggested that he would be happy to apply this in case the patient was ready for it. I had a detailed discussion with him regarding the patient's condition and I have recommended at the present time the patient would benefit from hyperbaric oxygen therapy, IV antibiotics and supportive  wound care. if at some stage the wound was ready for application of a cell I would be happy to talk to him about taking over her care. He agreed with the treatment plan and said he would be in touch. Readmission: 11-15-2021 upon evaluation today patient presents for initial inspection here in our clinic concerning issues that she has been having with a wound on the plantar aspect of her right foot. I did review notes as well and it appears that she had a bone spur noted but no signs of osteomyelitis this was in May when she saw Dr. Caryl Comes. With that being said he apparently was recommended surgery to go in and remove the bone spur as he feels like that is the causative reason for the wound and indeed I feel like that may be the case as well based on what I am seeing. Fortunately I do not see any evidence of active infection locally or systemically right now although there is definitely some callus buildup and there is definitely more fluid trapped underneath that is hard to even tell which is what we are seeing currently working have to remove some of this callus in order to identify what is even going on underneath and how deep this really goes. The patient does have a history of diabetes mellitus type 2, diabetic neuropathy, and hypertension. Otherwise there are no major medical problems that would affect her wound healing. Her most recent hemoglobin A1c was on 06-16-2021 and is stated to be good at 6.7 11-29-2021 upon evaluation today patient appears to be doing well currently in regard to her wound. She has been tolerating the dressing changes without complication. Fortunately there does not appear to be any evidence of active infection locally or systemically which is great news. No fevers, chills, nausea, vomiting, or diarrhea. I do think the Hydrofera Blue rope is doing decently well.  With that being said she does have a little callus that builds up I am after cleaning this away as well as  cleaning some of the base of the wound as well. She does have her MRI scheduled for later today. [name], [name] ([id number]) Objective Constitutional Well-nourished and well-hydrated in no acute distress. Vitals Time Taken: 8:35 AM, Height: 67 in, Weight: 248 lbs, BMI: 38.8, Temperature: 98 F, Pulse: 80 bpm, Respiratory Rate: 18 breaths/min, Blood Pressure: 145/78 mmHg. Respiratory normal breathing without difficulty. Psychiatric this patient is able to make decisions and demonstrates good insight into disease process. Alert and Oriented x 3. pleasant and cooperative. General Notes: Upon inspection patient's wound bed actually showed signs of good granulation and epithelization at this point. Fortunately there does not appear to be any evidence of active infection locally or systemically which is great news. No fevers, chills, nausea, vomiting, or diarrhea. Integumentary (Hair, Skin) Wound #5 status is Open. Original cause of wound was Pressure Injury. The date acquired was: 06/23/2021. The wound has been in treatment 2 weeks. The wound is located on the [place]. The wound measures 0.3cm length x 0.3cm width x 0.8cm depth; 0.071cm^2 area and 0.057cm^3 volume. There is Fat Layer (Subcutaneous Tissue) exposed. There is no tunneling noted, however, there is undermining starting at 12:00 and ending at 12:00 with a maximum distance of 1.3cm. There is a medium amount of serosanguineous drainage noted. There is medium (34-66%) pale granulation within the wound bed. There is a medium (34-66%) amount of necrotic tissue within the wound bed including Adherent Slough. 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 There was a Excisional Skin/Subcutaneous Tissue Debridement with a total area of 0.09 sq cm performed by Tommie Sams., PA-C. With the following instrument(s): Curette to remove Viable and Non-Viable tissue/material. Material removed includes Callus, Subcutaneous Tissue, and Slough after achieving pain control using Lidocaine 4% Topical Solution. No specimens were taken. A time out was conducted at 09:03, prior to the start of the procedure. A Minimum amount of bleeding was controlled with Pressure. The procedure was tolerated well. Post Debridement Measurements: 0.3cm length x 0.3cm width x 0.8cm depth; 0.057cm^3 volume. Character of Wound/Ulcer Post Debridement is stable. Severity of Tissue Post Debridement is: Fat layer exposed. Post procedure Diagnosis Wound #5: Same as Pre-Procedure Plan Follow-up Appointments: Return Appointment in 1 week. Nurse Visit as needed Home Health: ADMIT to [place] for wound care. May utilize formulary equivalent dressing for wound treatment orders unless otherwise specified. Home Health Nurse may visit [facility] to address patient s wound care needs. Scheduled days for dressing changes to be completed; exception, patient has scheduled wound care visit that day. Ricardo, Sofya F. ([id number]) **Please direct any NON-WOUND related issues/requests for orders to patient's Primary Care Physician. **If current dressing causes regression in wound condition, may D/C ordered dressing product/s and apply Normal Saline Moist Dressing daily until next [place] or Other MD appointment. **Notify [facility] of regression in wound condition at [phone]. Bathing/ Shower/ Hygiene: Wash wounds with antibacterial soap and water. May shower; gently cleanse wound with antibacterial soap, rinse and pat dry prior to dressing wounds  No  tub bath. Other: - For the hydrofera blue rope, please insert at 7-8 o'clock towards the heel as this is the deepest area. Off-Loading: Other: - continue to wear crow walker boot to relieve pressure off wound WOUND #5: - Foot Wound Laterality: Plantar, Right Cleanser: Byram Ancillary Kit - 15 Day Supply 3 x Per Week/30 Days Discharge Instructions: Use supplies as instructed; Kit contains: (15) Saline Bullets; (15) 3x3 Gauze; 15 pr Gloves Cleanser: Soap and Water 3 x Per Week/30 Days Discharge Instructions: Gently cleanse wound with antibacterial soap, rinse and pat dry prior to dressing wounds Primary Dressing: Hydrofera Blue Classic Foam Rope Dressing, 9x6 (mm/in) 3 x Per Week/30 Days Discharge Instructions: Cut rope in half, insert rope at 7-8 o"clock towards heel, cut at 4 cm length and insert. Please leave a tail hanging out in order to remove. wet end with saline so it is flexible Secondary Dressing: (SILICONE BORDER) Zetuvit Plus SILICONE BORDER Dressing 4x4 (in/in) 3 x Per Week/30 Days Discharge Instructions: Please do not put silicone bordered dressings under wraps. Use non-bordered dressing only. 1. I am going to suggest that we have the patient continue to utilize the [facility] that we need to make sure they have enough to leave a tail out so it can be easily removed. 2. I am also can recommend the bordered foam dressing to cover. 3. I am also going to suggest that we have the patient continue to monitor for any signs of worsening or infection if anything changes she should let me know. 4. She does have her MRI later today depending on the results of the MRI we will decide where to go next from the standpoint of recommendations for her to see her surgeon versus continuing with wound care possibly even requiring IV antibiotics. We will see patient back for reevaluation in 1 week here in the clinic. If anything worsens or changes patient will contact our office for  additional recommendations. Electronic Signature(s) Signed: 11/29/2021 9:36:39 AM By: Worthy Keeler PA-C Entered By: Worthy Keeler on 11/29/2021 09:36:38 [name], [name] ([id number]) -------------------------------------------------------------------------------- SuperBill Details Patient Name: [name], [name] Date of Service: 11/29/2021 Medical Record Number: [id number] Patient Account Number: [id number] Date of Birth/Sex: [birth year] (71 y.o. 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 I10 Essential (primary) hypertension Facility Procedures CPT4 Code: 11657903 Description: 83338 - DEB SUBQ TISSUE 20 SQ CM/< Modifier: Quantity: 1 CPT4 Code: Description: ICD-10 Diagnosis Description L97.512 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

## 2021-12-13 ENCOUNTER — Ambulatory Visit: Payer: 59 | Admitting: Internal Medicine

## 2021-12-20 ENCOUNTER — Encounter: Payer: 59 | Admitting: Physician Assistant

## 2021-12-20 DIAGNOSIS — E11621 Type 2 diabetes mellitus with foot ulcer: Secondary | ICD-10-CM | POA: Diagnosis not present

## 2021-12-20 NOTE — Progress Notes (Addendum)
MARYCARMEN, HAGEY F ([id number]) [id number] Page 1 of 9 Visit Report for 12/20/2021 Arrival Information Details Patient Name: Date of Service: TA LEYAH, BOCCHINO. 12/20/2021 8:30 A M Medical Record Number: [id number] Patient Account Number: [id number] Date of Birth/Sex: Treating RN: [birth year] (71 y.o. Marlowe Shores Primary Care Kentavius Dettore: Denton Lank Other Clinician: Referring Thamas Appleyard: Treating Lera Gaines/Extender: Almedia Balls in Treatment: 5 Visit Information History Since Last Visit Added or deleted any medications: No Patient Arrived: Wheel Chair Has Dressing in Place as Prescribed: Yes Arrival Time: 08:43 Has Footwear/Offloading in Place as Yes Transfer Assistance: Manual Prescribed: Patient Identification Verified: Yes Right: Removable Cast Walker/Walking Boot Secondary Verification Process Completed: Yes Pain Present Now: No Patient Has Alerts: Yes Patient Alerts: Patient on Blood Thinner ABI 11/15/21 L 1.17 R 1.27 Electronic Signature(s) Signed: 12/20/2021 5:09:07 PM By: Gretta Cool, BSN, RN, CWS, [name] RN, BSN Entered By: Gretta Cool, BSN, RN, CWS, [name] on 12/20/2021 08:43:54 -------------------------------------------------------------------------------- Clinic Level of Care Assessment Details Patient Name: Date of Service: [name], Sianni F. 12/20/2021 8:30 A M Medical Record Number: [id number] Patient Account Number: [id number] Date of Birth/Sex: Treating RN: July 09, 1950 (71 y.o. Marlowe Shores Primary Care Keyleen Cerrato: Denton Lank Other Clinician: Referring Banyan Goodchild: Treating Lucila Klecka/Extender: Almedia Balls in Treatment: 5 Clinic Level of Care Assessment Items TOOL 4 Quantity Score '[]'$  - 0 Use when only an EandM is performed on FOLLOW-UP visit ASSESSMENTS - Nursing Assessment / Reassessment X- 1 10 Reassessment of Co-morbidities (includes updates in patient status) X- 1 5 Reassessment of Adherence to Treatment Plan ASSESSMENTS -  Wound and Skin A ssessment / Reassessment X - Simple Wound Assessment / Reassessment - one wound 1 5 '[]'$  - 0 Complex Wound Assessment / Reassessment - multiple wounds Duhart, Fannye F ([id number]) [id number] Page 2 of 9 '[]'$  - 0 Dermatologic / Skin Assessment (not related to wound area) ASSESSMENTS - Focused Assessment '[]'$  - 0 Circumferential Edema Measurements - multi extremities '[]'$  - 0 Nutritional Assessment / Counseling / Intervention '[]'$  - 0 Lower Extremity Assessment (monofilament, tuning fork, pulses) '[]'$  - 0 Peripheral Arterial Disease Assessment (using hand held doppler) ASSESSMENTS - Ostomy and/or Continence Assessment and Care '[]'$  - 0 Incontinence Assessment and Management '[]'$  - 0 Ostomy Care Assessment and Management (repouching, etc.) PROCESS - Coordination of Care '[]'$  - 0 Simple Patient / Family Education for ongoing care X- 1 20 Complex (extensive) Patient / Family Education for ongoing care '[]'$  - 0 Staff obtains Programmer, systems, Records, T Results / Process Orders est X- 1 10 Staff telephones HHA, Nursing Homes / Clarify orders / etc '[]'$  - 0 Routine Transfer to another Facility (non-emergent condition) '[]'$  - 0 Routine Hospital Admission (non-emergent condition) '[]'$  - 0 New Admissions / Biomedical engineer / Ordering NPWT Apligraf, etc. , '[]'$  - 0 Emergency Hospital Admission (emergent condition) X- 1 10 Simple Discharge Coordination '[]'$  - 0 Complex (extensive) Discharge Coordination PROCESS - Special Needs '[]'$  - 0 Pediatric / Minor Patient Management '[]'$  - 0 Isolation Patient Management '[]'$  - 0 Hearing / Language / Visual special needs '[]'$  - 0 Assessment of Community assistance (transportation, D/C planning, etc.) '[]'$  - 0 Additional assistance / Altered mentation '[]'$  - 0 Support Surface(s) Assessment (bed, cushion, seat, etc.) INTERVENTIONS - Wound Cleansing / Measurement X - Simple Wound Cleansing - one wound 1 5 '[]'$  - 0 Complex Wound  Cleansing - multiple wounds X- 1 5 Wound Imaging (photographs - any number of wounds) '[]'$  - 0 Wound Tracing (instead of photographs) X-  1 5 Simple Wound Measurement - one wound '[]'$  - 0 Complex Wound Measurement - multiple wounds INTERVENTIONS - Wound Dressings '[]'$  - 0 Small Wound Dressing one or multiple wounds X- 1 15 Medium Wound Dressing one or multiple wounds '[]'$  - 0 Large Wound Dressing one or multiple wounds '[]'$  - 0 Application of Medications - topical '[]'$  - 0 Application of Medications - injection INTERVENTIONS - Miscellaneous '[]'$  - 0 External ear exam '[]'$  - 0 Specimen Collection (cultures, biopsies, blood, body fluids, etc.) '[]'$  - 0 Specimen(s) / Culture(s) sent or taken to Lab for analysis '[]'$  - 0 Patient Transfer (multiple staff / Stormy Fabian / Similar devices) Nicosia, Beverley F ([id number]) [phone].pdf Page 3 of 9 '[]'$  - 0 Simple Staple / Suture removal (25 or less) '[]'$  - 0 Complex Staple / Suture removal (26 or more) '[]'$  - 0 Hypo / Hyperglycemic Management (close monitor of Blood Glucose) '[]'$  - 0 Ankle / Brachial Index (ABI) - do not check if billed separately X- 1 5 Vital Signs Has the patient been seen at the hospital within the last three years: Yes Total Score: 95 Level Of Care: New/Established - Level 3 Electronic Signature(s) Signed: 12/20/2021 5:09:07 PM By: Gretta Cool, BSN, RN, CWS, [name] RN, BSN Entered By: Gretta Cool, BSN, RN, CWS, [name] on 12/20/2021 64:40:34 -------------------------------------------------------------------------------- Encounter Discharge Information Details Patient Name: Date of Service: [name], Atley F. 12/20/2021 8:30 A M Medical Record Number: [id number] Patient Account Number: [id number] Date of Birth/Sex: Treating RN: [birth year] (71 y.o. Mar[place]e Sh[place] Primary Care Tiarah Shisler: Denton Lank Other Clinician: Referring Vincente Asbridge: Treating Deacon Gadbois/Extender: Almedia Balls in Treatment: 5 Encounter Discharge  Information Items Discharge Condition: Stable Ambulatory Status: Wheelchair Discharge Destination: Home Transportation: Private Auto Schedule Follow-up Appointment: Yes Clinical Summary of Care: Electronic Signature(s) Signed: 12/20/2021 5:09:07 PM By: Gretta Cool, BSN, RN, CWS, [name] RN, BSN Entered By: Gretta Cool, BSN, RN, CWS, [name] on 12/20/2021 09:25:17 -------------------------------------------------------------------------------- Lower Extremity Assessment Details Patient Name: Date of Service: [name], Shawnika F. 12/20/2021 8:30 A M Medical Record Number: [id number] Patient Account Number: [id number] Date of Birth/Sex: Treating RN: Jul 25, 1950 (71 y.o. [place]e [place] Primary Care Chidinma Clites: Denton Lank Other Clinician: Referring Rhonda Vangieson: Treating Dusten Ellinwood/Extender: Almedia Balls in Treatment: 5 Hust, Leocadia F ([id number]) [id number] Page 4 of 9 Edema Assessment Assessed: [Left: No] [Right: No] [Left: Edema] [Right: :] Calf Left: Right: Point of Measurement: 31 cm From Medial Instep 42 cm Ankle Left: Right: Point of Measurement: 10 cm From Medial Instep 25 cm Vascular Assessment Pulses: Dorsalis Pedis Palpable: [Left:Yes] Electronic Signature(s) Signed: 12/20/2021 5:09:07 PM By: Gretta Cool, BSN, RN, CWS, [name] RN, BSN Entered By: Gretta Cool, BSN, RN, CWS, [name] on 12/20/2021 08:52:13 -------------------------------------------------------------------------------- Multi Wound Chart Details Patient Name: Date of Service: TA [name], Shery F. 12/20/2021 8:30 A M Medical Record Number: [id number] Patient Account Number: [id number] Date of Birth/Sex: Treating RN: 1950/10/23 (71 y.o. [place]e [place] Primary Care Arabela Basaldua: Denton Lank Other Clinician: Referring Charmayne Odell: Treating Vidal Lampkins/Extender: Almedia Balls in Treatment: 5 Vital Signs Height(in): 67 Pulse(bpm): 80 Weight(lbs): 248 Blood Pressure(mmHg): 130/83 Body Mass Index(BMI):  38.8 Temperature(F): 98.2 Respiratory Rate(breaths/min): 18 [5:Photos:] [N/A:N/A] Right, Plantar Foot N/A N/A Wound Location: Pressure Injury N/A N/A Wounding Event: Diabetic Wound/Ulcer of the Lower N/A N/A Primary Etiology: Extremity Cataracts, Anemia, Hypertension, N/A N/A Comorbid History: Type II Diabetes, Osteoarthritis, Osteomyelitis, Neuropathy, Confinement Anxiety 06/23/2021 N/A N/A Date Acquired: EXILDA, WILHITE ([id number]) [id number] Page 5 of 9 5 N/A N/A Weeks of Treatment: Open  N/A N/A Wound Status: No N/A N/A Wound Recurrence: 0.5x0.5x0.9 N/A N/A Measurements L x W x D (cm) 0.196 N/A N/A A (cm) : rea 0.177 N/A N/A Volume (cm) : -176.10% N/A N/A % Reduction in A rea: -1164.30% N/A N/A % Reduction in Volume: 12 Starting Position 1 (o'clock): 12 Ending Position 1 (o'clock): 2.5 Maximum Distance 1 (cm): Yes N/A N/A Undermining: Grade 2 N/A N/A Classification: Large N/A N/A Exudate A mount: Serous N/A N/A Exudate Type: amber N/A N/A Exudate Color: Well defined, not attached N/A N/A Wound Margin: Medium (34-66%) N/A N/A Granulation A mount: Red, Pale N/A N/A Granulation Quality: Medium (34-66%) N/A N/A Necrotic A mount: Fat Layer (Subcutaneous Tissue): Yes N/A N/A Exposed Structures: None N/A N/A Epithelialization: callus around edges of wound N/A N/A Assessment Notes: Treatment Notes Electronic Signature(s) Signed: 12/20/2021 5:09:07 PM By: Gretta Cool, BSN, RN, CWS, [name] RN, BSN Entered By: Gretta Cool, BSN, RN, CWS, [name] on 12/20/2021 09:05:21 -------------------------------------------------------------------------------- Multi-Disciplinary Care Plan Details Patient Name: Date of Service: [name], Anysha F. 12/20/2021 8:30 A M Medical Record Number: [id number] Patient Account Number: [id number] Date of Birth/Sex: Treating RN: December 03, 1950 (71 y.o. [place]e Sh[place] Primary Care Andrick Rust: Denton Lank Other  Clinician: Referring Cassidi Modesitt: Treating Monnie Anspach/Extender: Almedia Balls in Treatment: 5 Active Inactive Osteomyelitis Nursing Diagnoses: Infection: osteomyelitis Knowledge deficit related to disease process and management Potential for infection: osteomyelitis Goals: Diagnostic evaluation for osteomyelitis completed as ordered Date Initiated: 12/20/2021 Target Resolution Date: 12/20/2021 Goal Status: Active Patient/caregiver will verbalize understanding of disease process and disease management Date Initiated: 12/20/2021 Target Resolution Date: 12/20/2021 Goal Status: Active Patient's osteomyelitis will resolve Date Initiated: 12/20/2021 Target Resolution Date: 12/20/2021 Goal Status: Active Signs and symptoms for osteomyelitis will be recognized and promptly addressed Date Initiated: 12/20/2021 Target Resolution Date: 12/20/2021 Goal Status: Active Syfert, Jenah F ([id number]) [id number] Page 6 of 9 Interventions: Assess for signs and symptoms of osteomyelitis resolution every visit Provide education on osteomyelitis Notes: Wound/Skin Impairment Nursing Diagnoses: Impaired tissue integrity Knowledge deficit related to ulceration/compromised skin integrity Goals: Ulcer/skin breakdown will have a volume reduction of 30% by week 4 Date Initiated: 11/15/2021 Target Resolution Date: 12/13/2021 Goal Status: Active Ulcer/skin breakdown will have a volume reduction of 50% by week 8 Date Initiated: 11/15/2021 Target Resolution Date: 01/10/2022 Goal Status: Active Ulcer/skin breakdown will have a volume reduction of 80% by week 12 Date Initiated: 11/15/2021 Target Resolution Date: 02/07/2022 Goal Status: Active Ulcer/skin breakdown will heal within 14 weeks Date Initiated: 11/15/2021 Target Resolution Date: 02/21/2022 Goal Status: Active Interventions: Assess patient/caregiver ability to obtain necessary supplies Assess patient/caregiver  ability to perform ulcer/skin care regimen upon admission and as needed Assess ulceration(s) every visit Provide education on ulcer and skin care Notes: Electronic Signature(s) Signed: 12/20/2021 5:09:07 PM By: Gretta Cool, BSN, RN, CWS, [name] RN, BSN Entered By: Gretta Cool, BSN, RN, CWS, [name] on 12/20/2021 09:05:16 -------------------------------------------------------------------------------- Pain Assessment Details Patient Name: Date of Service: [name], Dariya F. 12/20/2021 8:30 A M Medical Record Number: [id number] Patient Account Number: [id number] Date of Birth/Sex: Treating RN: 1950-12-25 (71 y.o. [place]e [place] Primary Care Camaria Gerald: Denton Lank Other Clinician: Referring Leighana Neyman: Treating Lynne Takemoto/Extender: Almedia Balls in Treatment: 5 Active Problems Location of Pain Severity and Description of Pain Patient Has Paino No Site Locations AGAM, TUOHY F ([id number]) [id number] Page 7 of 9 Pain Management and Medication Current Pain Management: Electronic Signature(s) Signed: 12/20/2021 5:09:07 PM By: Gretta Cool, BSN, RN, CWS, [name] RN, BSN Entered By: Gretta Cool, BSN, RN, CWS,  [name] on 12/20/2021 08:44:17 -------------------------------------------------------------------------------- Patient/Caregiver Education Details Patient Name: Date of Service: TA BELLANY, ELBAUM 7/31/2023andnbsp8:30 [place] Record Number: [id number] Patient Account Number: [id number] Date of Birth/Gender: Treating RN: 30-Sep-1950 (71 y.o. [place]e Sh[place] Primary Care Physician: Denton Lank Other Clinician: Referring Physician: Treating Physician/Extender: Almedia Balls in Treatment: 5 Education Assessment Education Provided To: Patient Education Topics Provided Infection: Handouts: Infection Prevention and Management Methods: Demonstration, Explain/Verbal Responses: State content correctly Wound/Skin Impairment: Handouts: Caring for Your Ulcer Methods:  Demonstration Responses: State content correctly Electronic Signature(s) Signed: 12/20/2021 5:09:07 PM By: Gretta Cool, BSN, RN, CWS, [name] RN, BSN Entered By: Gretta Cool, BSN, RN, CWS, [name] on 12/20/2021 09:16:53 Winsor, Francis Gaines ([id number]) [id number] Page 8 of 9 -------------------------------------------------------------------------------- Wound Assessment Details Patient Name: Date of Service: TA [place]. 12/20/2021 8:30 A M Medical Record Number: [id number] Patient Account Number: [id number] Date of Birth/Sex: Treating RN: Nov 05, 1950 (71 y.o. [name], [name] Primary Care Esmond Hinch: Denton Lank Other Clinician: Referring Kevyn Boquet: Treating Everrett Lacasse/Extender: Almedia Balls in Treatment: 5 Wound Status Wound Number: 5 Primary Diabetic Wound/Ulcer of the Lower Extremity Etiology: Wound Location: Right, Plantar Foot Wound Open Wounding Event: Pressure Injury Status: Date Acquired: 06/23/2021 Comorbid Cataracts, Anemia, Hypertension, Type II Diabetes, Osteoarthritis, Weeks Of Treatment: 5 History: Osteomyelitis, Neuropathy, Confinement Anxiety Clustered Wound: No Photos Wound Measurements Length: (cm) 0.5 Width: (cm) 0.5 Depth: (cm) 0.9 Area: (cm) 0.196 Volume: (cm) 0.177 % Reduction in Area: -176.1% % Reduction in Volume: -1164.3% Epithelialization: None Undermining: Yes Starting Position (o'clock): 12 Ending Position (o'clock): 12 Maximum Distance: (cm) 2.5 Wound Description Classification: Grade 3 Wagner Verification: MRI Wound Margin: Well defined, not attached Exudate Amount: Large Exudate Type: Serous Exudate Color: amber Foul Odor After Cleansing: No Slough/Fibrino Yes Wound Bed Granulation Amount: Medium (34-66%) Exposed Structure Granulation Quality: Red, Pale Fat Layer (Subcutaneous Tissue) Exposed: Yes Necrotic Amount: Medium (34-66%) Necrotic Quality: Adherent Slough Assessment Notes callus around edges of  wound Electronic Signature(s) Signed: 03/28/2022 3:11:18 PM By: Gretta Cool, BSN, RN, CWS, [name] RN, BSN Previous Signature: 12/20/2021 5:09:07 PM Version By: Gretta Cool, BSN, RN, CWS, [name] RN, BSN [place], [place] F ([id number]) [phone].pdf Page 9 of 9 Entered By: Gretta Cool, BSN, RN, CWS, [name] on 03/28/2022 15:11:18 -------------------------------------------------------------------------------- Vitals Details Patient Name: Date of Service: [name], Sheron F. 12/20/2021 8:30 A M Medical Record Number: [id number] Patient Account Number: [id number] Date of Birth/Sex: Treating RN: 09-08-1950 (71 y.o. [name], [name] Primary Care Dorie Ohms: Denton Lank Other Clinician: Referring Mayela Bullard: Treating Fleur Audino/Extender: Almedia Balls in Treatment: 5 Vital Signs Time Taken: 08:43 Temperature (F): 98.2 Height (in): 67 Pulse (bpm): 80 Weight (lbs): 248 Respiratory Rate (breaths/min): 18 Body Mass Index (BMI): 38.8 Blood Pressure (mmHg): 130/83 Reference Range: 80 - 120 mg / dl Electronic Signature(s) Signed: 12/20/2021 5:09:07 PM By: Gretta Cool, BSN, RN, CWS, [name] RN, BSN Entered By: Gretta Cool, BSN, RN, CWS, [name] on 12/20/2021 08:44:13

## 2021-12-20 NOTE — Progress Notes (Addendum)
[name] ([id number]) Visit Report for 12/20/2021 Chief Complaint Document Details Patient Name: [name], [name] Date of Service: 12/20/2021 8:30 AM Medical Record Number: [id number] Patient Account Number: [id number] Date of Birth/Sex: [birth year] (71 y.o. F) Treating RN: Levora Dredge Primary Care Provider: Denton Lank Other Clinician: Referring Provider: Denton Lank Treating Provider/Extender: Jeri Cos Weeks in Treatment: 5 Information Obtained from: Patient Chief Complaint Right Foot Ulcer Electronic Signature(s) Signed: 12/20/2021 8:21:37 AM By: Worthy Keeler PA-C Entered By: Worthy Keeler on 12/20/2021 08:21:37 [name], [name] ([id number]) -------------------------------------------------------------------------------- HPI Details Patient Name: [name], [name] Date of Service: 12/20/2021 8:30 AM Medical Record Number: [id number] Patient Account Number: [id number] Date of Birth/Sex: Mar 01, 1951 (71 y.o. F) Treating RN: Levora Dredge Primary Care Provider: Denton Lank Other Clinician: Referring Provider: Denton Lank Treating Provider/Extender: Skipper Cliche in Treatment: 5 History of Present Illness HPI Description: this is a 71 year old morbidly obese patient who has uncontrolled diabetes and has not been taking treatment for diabetes for at least 6 months. She presents with bilateral heel ulcers of only 2 weeks duration. He was sent to [place] by her PCP Dr. Veda Canning. After this the patient was seen both by the podiatrist Dr. Caryl Comes and her vascular surgeon Dr. Delana Meyer. from what I understand from the son Dr. Delana Meyer has done some process on her and a procedure and says that he has finished working on her and no further vascular intervention is to be done. the patient was sent was for a further evaluation of her wounds. Her son has been doing some dressings at home on a twice a daily basis with Silvadene ointment. Some recent blood work done on 07/19/2014 shows  that her hemoglobin was 7.9 with a hematocrit of 25.7. Her white count was 12.7 and her glucose was 175 mg/dL. Her hemoglobin A1c was 8.4%. She has had no recent x-rays available for review. 07/31/2014. I was able to review the office visit notes from Dr. Delana Meyer on 07/14/2014. The patient's ABI on the right was 0.85 and on the left was 0.89 and both were abnormal. His assessment and plan was that the patient had severe atherosclerotic changes of both lower extremities associated with ulceration and tissue loss of the foot and this represent a limb threatening ischemia and he recommended a angiography of the lower extremities with hope for intervention for limb salvage. This was scheduled the procedure was done on 07/22/14 The surgery performed was an abdominal aortogram with the right lower extremity runoff and catheter placement. 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 08/07/2014 x-rays were done of both feet on March 10. The right heel showed soft tissue wound, with some calcaneus lesion suspicious for osteomyelitis. An MRI would be recommended. the left heel did not show any evidence of osteomyelitis. 08/14/2014 -- Reports on [name] : Cardiology reports reviewed from the [facility] clinic by Dr. Nehemiah Massed 2-D echocardiography done on 08/09/2013 showed that her normal LV systolic function with ejection fraction of 55% She also had mild mitral and tricuspid insufficiency. 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  Culture report of the bone shows several organisms which are grown and she  has been put on Bactrim DS one twice a day for 14 days. The patient's bariatric surgeon Dr. Duke Salvia did speak with me today regarding the application of ACell and suggested that he would be happy to apply this in case the patient was ready for it. I had a detailed discussion with him regarding the patient's condition and I have recommended at the present time the patient would benefit from hyperbaric oxygen therapy, IV antibiotics and supportive wound care. if at some stage the wound was ready for application of a cell I would be happy to talk to him about taking over her care. He agreed with the treatment plan and said he would be in touch. Readmission: 11-15-2021 upon evaluation today patient presents for initial inspection here in our clinic concerning issues that she has been having with a wound on the plantar aspect of her right foot. I did review notes as well and it appears that she had a bone spur noted but no signs of osteomyelitis this was in May when she saw Dr. Caryl Comes. With that being said he apparently was recommended surgery to go in and remove the bone spur as he feels like that is the causative reason for the wound and indeed I feel like that may be the case as well based on what I am seeing. Fortunately I do not see any evidence of active infection locally or systemically right now although there is definitely some callus buildup and there is definitely more fluid trapped underneath that is hard to even tell which is what we are seeing currently working have to remove some of this callus in order to identify what is even going on underneath and how deep this really goes. The patient does have a history of diabetes mellitus type 2, diabetic neuropathy, and hypertension. Otherwise there are no major medical problems that would affect her wound healing. Her most recent hemoglobin A1c was on 06-16-2021 and is stated to be good at 6.7 11-29-2021 upon evaluation today patient appears to be doing  well currently in regard to her wound. She has been tolerating the dressing changes without complication. Fortunately there does not appear to be any evidence of active infection locally or systemically which is great news. No fevers, chills, nausea, vomiting, or diarrhea. I do think the Hydrofera Blue rope is doing decently well. With that being said she does have a little callus that builds up I am after cleaning this away as well as cleaning some of the base of the wound as well. She does have her MRI scheduled for later today. 12-20-2021 upon evaluation today patient appears to be doing well currently in regard to her heel ulcer. This is actually significantly improved compared to previous evaluations. I do think that we are on the right track here and we are seeing some definitive improvements. Overall I think that the patient is on the right track. I do not see any evidence of active infection systemically and locally I feel like this is also doing much better. She has been on doxycycline which I placed her on beginning 11-30-2021. This will be for 2 months and she has not yet completed the first month. Electronic Signature(s) Linville, MILLISSA DEESE ([id number]) Signed: 12/20/2021 9:12:16 AM By: Worthy Keeler PA-C Entered By: Worthy Keeler on 12/20/2021 09:12:15 [name], [name] ([id number]) -------------------------------------------------------------------------------- Physical Exam Details Patient Name: Siegfried, Shontia F. Date of Service: 12/20/2021  8:30 AM Medical Record Number: [id number] Patient Account Number: [id number] Date of Birth/Sex: November 01, 1950 (71 y.o. F) Treating RN: Levora Dredge Primary Care Provider: Denton Lank Other Clinician: Referring Provider: Denton Lank Treating Provider/Extender: Jeri Cos Weeks in Treatment: 5 Constitutional Well-nourished and well-hydrated in no acute distress. Respiratory normal breathing without difficulty. Psychiatric this patient is able to  make decisions and demonstrates good insight into disease process. Alert and Oriented x 3. pleasant and cooperative. Notes Upon inspection patient's wound bed actually showed signs of significant improvement compared to last time I saw her there is no bone exposed currently and actually I do see some evidence of still significant undermining but at the same time other than the undermining this is really doing quite well. I do think staying off of it still good to be the biggest thing she can do coupled with the antibiotics and I think hyperbarics is an option if things stalled but again right now we seem to be on a pretty good path. Electronic Signature(s) Signed: 12/20/2021 9:14:27 AM By: Worthy Keeler PA-C Entered By: Worthy Keeler on 12/20/2021 09:14:26 Balsley, [name] F. ([id number]) -------------------------------------------------------------------------------- Physician Orders Details Patient Name: [name], [name] Date of Service: 12/20/2021 8:30 AM Medical Record Number: [id number] Patient Account Number: [id number] Date of Birth/Sex: [birth year] (71 y.o. 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 o Nurse Visit as needed [place] for wound care. May utilize formulary equivalent dressing for wound treatment orders unless otherwise specified. Home Health Nurse may visit PRN to address patientos wound care needs. o  Scheduled days for dressing changes to be completed; exception, patient has scheduled wound care visit that day. o **Please direct any NON-WOUND related issues/requests for orders to patient's Primary Care Physician. **If current dressing causes regression in wound condition, may D/C ordered dressing product/s and apply Normal Saline Moist Dressing daily until next [place] or Other MD appointment. **Notify [facility] of regression in wound condition at [phone]. o Other Home Health Orders/Instructions: - Pack silver collagen lightly into wound undermining 12-12 2.5cm. Bathing/ Shower/ Hygiene o Wash wounds with antibacterial soap and water. o May shower; gently cleanse wound with antibacterial soap, rinse and pat dry prior to dressing wounds o No tub bath. Off-Loading o Other: - continue to wear crow walker boot to relieve pressure off wound Wound Treatment Wound #5 - Foot Wound Laterality: Plantar, Right Cleanser: Byram Ancillary Kit - 15 Day Supply 3 x Per Week/30 Days Discharge Instructions: Use supplies as instructed; Kit contains: (15) Saline Bullets; (15) 3x3 Gauze; 15 pr Gloves Cleanser: Soap and Water 3 x Per Week/30 Days Discharge Instructions: Gently cleanse wound with antibacterial soap, rinse and pat dry prior to dressing wounds Primary Dressing: Prisma 4.34 (in) (Home Health) 3 x Per Week/30 Days Discharge Instructions: Moisten w/normal saline or sterile water; Cover wound as directed. Do not remove from wound bed. Secondary Dressing: (SILICONE BORDER) Zetuvit Plus SILICONE BORDER Dressing 4x4 (in/in) (Home Health) 3 x Per Week/30 Days Discharge Instructions: Please do not put  silicone bordered dressings under wraps. Use non-bordered dressing only. Electronic Signature(s) Signed: 12/20/2021 5:09:07 PM By: Gretta Cool, BSN, RN, CWS, [name] RN, BSN Signed: 12/20/2021 5:40:09 PM By: Worthy Keeler PA-C Entered By: Gretta Cool BSN, RN, CWS, [name] on 12/20/2021  09:17:37 [name], Daisy Gaines ([id number]) -------------------------------------------------------------------------------- Problem List Details Patient Name: [name], [name] Date of Service: 12/20/2021 8:30 AM Medical Record Number: [id number] Patient Account Number: [id number] Date of Birth/Sex: 1951-01-15 (71 y.o. 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 ([id number]) -------------------------------------------------------------------------------- Progress Note Details Patient Name: [name], [name] Date of Service: 12/20/2021 8:30 AM Medical Record Number: [id number] Patient Account Number: [id number] Date of Birth/Sex: 21-Mar-1951 (71 y.o. F) Treating RN: Levora Dredge Primary Care Provider: Denton Lank Other Clinician: Referring Provider: Denton Lank Treating Provider/Extender: Skipper Cliche in Treatment: 5 Subjective Chief Complaint Information obtained from Patient Right Foot Ulcer History of Present Illness (HPI) this is a 71 year old morbidly obese patient who has uncontrolled diabetes and has not been taking treatment for diabetes for at least 6 months. She presents with bilateral heel ulcers of only 2 weeks duration. He was sent to [place] by her PCP Dr. Veda Canning. After  this the patient was seen both by the podiatrist Dr. Caryl Comes and her vascular surgeon Dr. Delana Meyer. from what I understand from the son Dr. Delana Meyer has done some process on her and a procedure and says that he has finished working on her and no further vascular intervention is to be done. the patient was sent was for a further evaluation of her wounds. Her son has been doing some dressings at home on a twice a daily basis with Silvadene ointment. Some recent blood work done on 07/19/2014 shows that her hemoglobin was 7.9 with a hematocrit of 25.7. Her white count was 12.7 and her glucose was 175 mg/dL. Her hemoglobin A1c was 8.4%. She has had no recent x-rays available for review. 07/31/2014. I was able to review the office visit notes from Dr. Delana Meyer on 07/14/2014. The patient's ABI on the right was 0.85 and on the left was 0.89 and both were abnormal. His assessment and plan was that the patient had severe atherosclerotic changes of both lower extremities associated with ulceration and tissue loss of the foot and this represent a limb threatening ischemia and he recommended a angiography of the lower extremities with hope for intervention for limb salvage. This was scheduled the procedure was done on 07/22/14 The surgery performed was an abdominal aortogram with the right lower extremity runoff and catheter placement. 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 08/07/2014 x-rays were done of both  feet on March 10. The right heel showed soft tissue wound, with some calcaneus lesion suspicious for osteomyelitis. An MRI would be recommended. the left heel did not show any evidence of osteomyelitis. 08/14/2014 --  Reports on Rosann Neeson : Cardiology reports reviewed from the [facility] clinic by Dr. Nehemiah Massed 2-D echocardiography done on 08/09/2013 showed that her normal LV systolic function with ejection fraction of 55% She also had mild mitral and tricuspid insufficiency. 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 Culture report of the bone shows several organisms which are grown and she has been put on Bactrim DS one twice a day for 14 days. The patient's bariatric surgeon Dr. Duke Salvia did speak with me today regarding the application of ACell and suggested that he would be happy to apply this in case the patient was ready for it. I had a detailed discussion with him regarding the patient's condition and I have recommended at the present time the patient would benefit from hyperbaric oxygen therapy, IV antibiotics and supportive wound care. if at some stage the wound was ready for application of a cell I would be happy to talk to him about taking over her care. He agreed with the treatment plan and said he would be in touch. Readmission: 11-15-2021 upon evaluation today patient presents for initial inspection here in our clinic concerning issues that she has been having with a wound on the plantar aspect of her right foot. I did review notes as well and it appears that she had a bone spur noted but no signs of osteomyelitis this was in May when she saw Dr. Caryl Comes. With that being said he apparently was recommended surgery to go in and remove the bone spur as he feels like that is the causative reason for the wound and indeed I feel like that may be the case as well based on what I am seeing. Fortunately I do not see any evidence of active infection locally or systemically right now although there is definitely some callus buildup and there is definitely more fluid trapped underneath that is hard  to even tell which is what we are seeing currently working have to remove some of this callus in order to identify what is even going on underneath and how deep this really goes. The patient does have a history of diabetes mellitus type 2, diabetic neuropathy, and hypertension. Otherwise there are no major medical problems that would affect her wound healing. Her most recent hemoglobin A1c was on 06-16-2021 and is stated to be good at 6.7 11-29-2021 upon evaluation today patient appears to be doing well currently in regard to her wound. She has been tolerating the dressing changes without complication. Fortunately there does not appear to be any evidence of active infection locally or systemically which is great news. No fevers, chills, nausea, vomiting, or diarrhea. I do think the Hydrofera Blue rope is doing decently well. With that being said she does have a little callus that builds up I am after cleaning this away as well as cleaning some of the base of the wound as well. She does have her MRI scheduled for later today. 12-20-2021 upon evaluation today patient appears to be doing well currently in regard to her heel ulcer. This is actually significantly improved compared to previous evaluations. I do think that we are on the right track here and we are seeing some  definitive improvements. Overall I think that the patient is on the right track. I do not see any evidence of active infection systemically and locally I feel like this is also doing much better. She has been on doxycycline which I placed her on beginning 11-30-2021. This will be for 2 months and she has not yet completed the first month. [name], [name] ([id number]) Objective Constitutional Well-nourished and well-hydrated in no acute distress. Vitals Time Taken: 8:43 AM, Height: 67 in, Weight: 248 lbs, BMI: 38.8, Temperature: 98.2 F, Pulse: 80 bpm, Respiratory Rate: 18 breaths/min, Blood Pressure: 130/83 mmHg. Respiratory normal  breathing without difficulty. Psychiatric this patient is able to make decisions and demonstrates good insight into disease process. Alert and Oriented x 3. pleasant and cooperative. General Notes: Upon inspection patient's wound bed actually showed signs of significant improvement compared to last time I saw her there is no bone exposed currently and actually I do see some evidence of still significant undermining but at the same time other than the undermining this is really doing quite well. I do think staying off of it still good to be the biggest thing she can do coupled with the antibiotics and I think hyperbarics is an option if things stalled but again right now we seem to be on a pretty good path. Integumentary (Hair, Skin) Wound #5 status is Open. Original cause of wound was Pressure Injury. The date acquired was: 06/23/2021. The wound has been in treatment 5 weeks. The wound is located on the [place]. The wound measures 0.5cm length x 0.5cm width x 0.9cm depth; 0.196cm^2 area and 0.177cm^3 volume. There is Fat Layer (Subcutaneous Tissue) exposed. There is undermining starting at 12:00 and ending at 12:00 with a maximum distance of 2.5cm. There is a large amount of serous drainage noted. The wound margin is well defined and not attached to the wound base. There is medium (34-66%) red, pale granulation within the wound bed. There is a medium (34-66%) amount of necrotic tissue within the wound bed including Adherent Slough. 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 Nurse Visit as needed Home Health: [place]: - [place] for wound  care. May utilize formulary equivalent dressing for wound treatment orders unless otherwise specified. Home Health Nurse may visit PRN to address patient s wound care needs. Scheduled days for dressing changes to be completed; exception, patient has scheduled wound care visit that day. **Please direct any NON-WOUND related issues/requests for orders to patient's Primary Care Physician. **If current dressing causes regression in wound condition, may D/C ordered dressing product/s and apply Normal Saline Moist Dressing daily until next [place] or Other MD appointment. **Notify W[facility]aling Center of regression in wound condition at [phone]. Other Home Health Orders/Instructions: - Pack silver collagen into wound undermining 12-12 2.5cm. Bathing/ Shower/ Hygiene: Wash wounds with antibacterial soap and water. May shower; gently cleanse wound with antibacterial soap, rinse and pat dry prior to dressing wounds No tub bath. [name], [name] ([id number]) Off-Loading: Other: - continue to wear crow walker boot to relieve pressure off wound WOUND #5: - Foot Wound Laterality: Plantar, Right Cleanser: Byram Ancillary Kit - 15 Day Supply 3 x Per Week/30 Days Discharge Instructions: Use supplies as instructed;  Kit contains: (15) Saline Bullets; (15) 3x3 Gauze; 15 pr Gloves Cleanser: Soap and Water 3 x Per Week/30 Days Discharge Instructions: Gently cleanse wound with antibacterial soap, rinse and pat dry prior to dressing wounds Primary Dressing: Prisma 4.34 (in) (Home Health) 3 x Per Week/30 Days Discharge Instructions: Moisten w/normal saline or sterile water; Cover wound as directed. Do not remove from wound bed. Secondary Dressing: (SILICONE BORDER) Zetuvit Plus SILICONE BORDER Dressing 4x4 (in/in) (Home Health) 3 x Per Week/30 Days Discharge Instructions: Please do not put silicone bordered dressings under wraps. Use non-bordered dressing only. 1. I would recommend currently that we  going to continue with the wound care measures as before and the patient is in agreement with plan this includes the use of the dressing changes every other day or 3 times a week, I recommend that we utilize silver collagen at this point I think this can be better than the [facility] based on the overall picture of what we are seeing at this point. 2. I am also can recommend that we have the patient continue to monitor for any signs of worsening or infection obviously if anything changes she should let me know but right now I do have her on the doxycycline she seems to be doing quite well in that regard. We will see patient back for reevaluation in 1 week here in the clinic. If anything worsens or changes patient will contact our office for additional recommendations. Electronic Signature(s) Signed: 12/20/2021 9:15:03 AM By: Worthy Keeler PA-C Entered By: Worthy Keeler on 12/20/2021 09:15:03 [name], [name] ([id number]) -------------------------------------------------------------------------------- SuperBill Details Patient Name: Brandvold, Keyosha F. Date of Service: 12/20/2021 Medical Record Number: [id number] Patient Account Number: [id number] Date of Birth/Sex: May 17, 1951 (71 y.o. 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

## 2022-01-03 ENCOUNTER — Encounter: Payer: 59 | Attending: Physician Assistant | Admitting: Physician Assistant

## 2022-01-03 DIAGNOSIS — E1143 Type 2 diabetes mellitus with diabetic autonomic (poly)neuropathy: Secondary | ICD-10-CM | POA: Insufficient documentation

## 2022-01-03 DIAGNOSIS — E11621 Type 2 diabetes mellitus with foot ulcer: Secondary | ICD-10-CM | POA: Insufficient documentation

## 2022-01-03 DIAGNOSIS — I10 Essential (primary) hypertension: Secondary | ICD-10-CM | POA: Insufficient documentation

## 2022-01-03 DIAGNOSIS — L97512 Non-pressure chronic ulcer of other part of right foot with fat layer exposed: Secondary | ICD-10-CM | POA: Insufficient documentation

## 2022-01-05 NOTE — Progress Notes (Addendum)
DORLA, GUIZAR F ([id number]) [id number] Page 1 of 9 Visit Report for 01/03/2022 Arrival Information Details Patient Name: Date of Service: TA [place]. 01/03/2022 8:30 A M Medical Record Number: [id number] Patient Account Number: [id number] Date of Birth/Sex: Treating RN: 12-03-1950 (71 y.o. [place]e [place] Primary Care May Ozment: Denton Lank Other Clinician: Referring Shantinique Picazo: Treating Pyper Olexa/Extender: Almedia Balls in Treatment: 7 Visit Information History Since Last Visit Added or deleted any medications: No Patient Arrived: Ambulatory Signs or symptoms of abuse/neglect since last No Arrival Time: 08:38 visito Accompanied By: self Has Dressing in Place as Prescribed: Yes Transfer Assistance: None Has Footwear/Offloading in Place as Prescribed: Yes Patient Identification Verified: Yes Right: Other:Charco Restraint Ortho Walker Secondary Verification Process Completed: Yes Pain Present Now: No Patient Has Alerts: Yes Patient Alerts: Patient on Blood Thinner ABI 11/15/21 L 1.17 R 1.27 Electronic Signature(s) Signed: 01/03/2022 5:37:15 PM By: Gretta Cool, BSN, RN, CWS, [name] RN, BSN Previous Signature: 01/03/2022 5:27:45 PM Version By: Gretta Cool, BSN, RN, CWS, [name] RN, BSN Entered By: Gretta Cool, BSN, RN, CWS, [name] on 01/03/2022 17:37:15 -------------------------------------------------------------------------------- Clinic Level of Care Assessment Details Patient Name: Date of Service: [name], Tariya F. 01/03/2022 8:30 A M Medical Record Number: [id number] Patient Account Number: [id number] Date of Birth/Sex: Treating RN: [birth year] (71 y.o. [place] Primary Care Latrese Carolan: Denton Lank Other Clinician: Referring Shiori Adcox: Treating Dairon Procter/Extender: Almedia Balls in Treatment: 7 Clinic Level of Care Assessment Items TOOL 4 Quantity Score _0  - 0 Use when only an EandM is performed on FOLLOW-UP visit ASSESSMENTS - Nursing  Assessment / Reassessment X- 1 10 Reassessment of Co-morbidities (includes updates in patient status) X- 1 5 Reassessment of Adherence to Treatment Plan ASSESSMENTS - Wound and Skin A ssessment / Reassessment X - Simple Wound Assessment / Reassessment - one wound 1 5 Ellett, Eurydice F ([id number]) [id number] Page 2 of 9 _1  - 0 Complex Wound Assessment / Reassessment - multiple wounds _2  - 0 Dermatologic / Skin Assessment (not related to wound area) ASSESSMENTS - Focused Assessment _3  - 0 Circumferential Edema Measurements - multi extremities _4  - 0 Nutritional Assessment / Counseling / Intervention _5  - 0 Lower Extremity Assessment (monofilament, tuning fork, pulses) _6  - 0 Peripheral Arterial Disease Assessment (using hand held doppler) ASSESSMENTS - Ostomy and/or Continence Assessment and Care _7  - 0 Incontinence Assessment and Management _8  - 0 Ostomy Care Assessment and Management (repouching, etc.) PROCESS - Coordination of Care X - Simple Patient / Family Education for ongoing care 1 15 _9  - 0 Complex (extensive) Patient / Family Education for ongoing care X- 1 10 Staff obtains Programmer, systems, Records, T Results / Process Orders est _10  - 0 Staff telephones HHA, Nursing Homes / Clarify orders / etc _11  - 0 Routine Transfer to another Facility (non-emergent condition) _12  - 0 Routine Hospital Admission (non-emergent condition) _13  - 0 New Admissions / Biomedical engineer / Ordering NPWT Apligraf, etc. , _14  - 0 Emergency Hospital Admission (emergent condition) X- 1 10 Simple Discharge Coordination _15  - 0 Complex (extensive) Discharge Coordination PROCESS - Special Needs _16  - 0 Pediatric / Minor Patient Management _17  - 0 Isolation Patient Management _18  - 0 Hearing / Language / Visual special needs _19  - 0 Assessment of Community assistance (transportation, D/C planning, etc.) _20  - 0 Additional assistance / Altered mentation _21  -  0 Support Surface(s) Assessment (bed, cushion, seat, etc.) INTERVENTIONS - Wound Cleansing / Measurement X - Simple Wound Cleansing - one wound 1 5 _22  - 0  Complex Wound Cleansing - multiple wounds X- 1 5 Wound Imaging (photographs - any number of wounds) _0  - 0 Wound Tracing (instead of photographs) X- 1 5 Simple Wound Measurement - one wound _1  - 0 Complex Wound Measurement - multiple wounds INTERVENTIONS - Wound Dressings _2  - 0 Small Wound Dressing one or multiple wounds X- 1 15 Medium Wound Dressing one or multiple wounds _3  - 0 Large Wound Dressing one or multiple wounds <YSAYTKZSWFUXNATF>_5<\/DDUKGURKYHCWCBJS>_2  - 0 Application of Medications - topical <GBTDVVOHYWVPXTGG>_2<\/IRSWNIOEVOJJKKXF>_8  - 0 Application of Medications - injection INTERVENTIONS - Miscellaneous _6  - 0 External ear exam _7  - 0 Specimen Collection (cultures, biopsies, blood, body fluids, etc.) _8  - 0 Specimen(s) / Culture(s) sent or taken to Lab for analysis Corwin, Bilan F ([id number]) [id number] Page 3 of 9 _9  - 0 Patient Transfer (multiple staff / Civil Service fast streamer / Similar devices) _10  - 0 Simple Staple / Suture removal (25 or less) _11  - 0 Complex Staple / Suture removal (26 or more) _12  - 0 Hypo / Hyperglycemic Management (close monitor of Blood Glucose) _13  - 0 Ankle / Brachial Index (ABI) - do not check if billed separately X- 1 5 Vital Signs Has the patient been seen at the hospital within the last three years: Yes Total Score: 90 Level Of Care: New/Established - Level 3 Electronic Signature(s) Signed: 01/03/2022 5:27:45 PM By: Gretta Cool, BSN, RN, CWS, [name] RN, BSN Entered By: Gretta Cool, BSN, RN, CWS, [name] on 01/03/2022 09:09:20 -------------------------------------------------------------------------------- Encounter Discharge Information Details Patient Name: Date of Service: [name], Ericka F. 01/03/2022 8:30 A M Medical Record Number: [id number] Patient Account Number: [id number] Date of Birth/Sex: Treating RN: June 15, 1950 (71 y.o. [place] Primary Care Kiaya Hal[place]: Denton Lank Other Clinician: Referring Umair Rosiles: Treating Zoa Dowty/Extender: Almedia Balls in Treatment: 7 Encounter Discharge Information Items Discharge Condition: Stable Ambulatory Status: Ambulatory Discharge Destination: Skilled Nursing Facility Orders Sent: Yes Transportation: Private Auto Accompanied By: self Schedule Follow-up Appointment: Yes Clinical Summary of Care: Electronic Signature(s) Signed: 01/03/2022 5:27:45 PM By: Gretta Cool, BSN, RN, CWS, [name] RN, BSN Entered By: Gretta Cool, BSN, RN, CWS, [name] on 01/03/2022 09:15:44 -------------------------------------------------------------------------------- Lower Extremity Assessment Details Patient Name: Date of Service: [name], Helena F. 01/03/2022 8:30 A M Medical Record Number: [id number] Patient Account Number: [id number] Date of Birth/Sex: Treating RN: [birth year] (71 y[address]o. [address] [place], [place] F ([id number]) [phone].pdf Page 4 of 9 Primary Care Laylana Gerwig: Denton Lank Other Clinician: Referring Roxsana Riding: Treating Emi Lymon/Extender: Almedia Balls in Treatment: 7 Edema Assessment Assessed: [Left: No] [Right: No] [Left: Edema] [Right: :] Calf Left: Right: Point of Measurement: From Medial Instep 41 cm Ankle Left: Right: Point of Measurement: From Medial Instep 25 cm Vascular Assessment Pulses: Dorsalis Pedis Palpable: [Right:Yes] Electronic Signature(s) Signed: 01/03/2022 5:27:45 PM By: Gretta Cool, BSN, RN, CWS, [name] RN, BSN Entered By: Gretta Cool, BSN, RN, CWS, [name] on 01/03/2022 08:56:49 -------------------------------------------------------------------------------- Multi Wound Chart Details Patient Name: Date of Service: TA [name], Inanna F. 01/03/2022 8:30 A M Medical Record Number: [id number] Patient Account Number: [id number] Date of Birth/Sex: Treating RN: [birth year] (71 y.o. [place] Primary Care Batoul Limes: Denton Lank Other  Clinician: Referring Ebonie Westerlund: Treating Mithcell Schumpert/Extender: Almedia Balls in Treatment: 7 Vital Signs Height(in): 70 Pulse(bpm): 61 Weight(lbs): 248 Blood Pressure(mmHg): 124/69 Body Mass Index(BMI): 38.8 Temperature(F): 98.4 Respiratory Rate(breaths/min): 18 [5:Photos:] [N/A:N/A] Right, Plantar Foot N/A N/A Wound Location: Pressure Injury N/A N/A Wounding Event: Diabetic Wound/Ulcer of the Lower N/A N/A Primary Etiology: Extremity Cataracts, Anemia, Hypertension, N/A N/A Comorbid  HistoryDEIRDRA, HEUMANN ([id number]) [id number] Page 5 of 9 Type II Diabetes, Osteoarthritis, Osteomyelitis, Neuropathy, Confinement Anxiety 06/23/2021 N/A N/A Date Acquired: 7 N/A N/A Weeks of Treatment: Open N/A N/A Wound Status: No N/A N/A Wound Recurrence: 0.4x0.5x0.6 N/A N/A Measurements L x W x D (cm) 0.157 N/A N/A A (cm) : rea 0.094 N/A N/A Volume (cm) : -121.10% N/A N/A % Reduction in A rea: -571.40% N/A N/A % Reduction in Volume: 12 Starting Position 1 (o'clock): 12 Ending Position 1 (o'clock): 3 Maximum Distance 1 (cm): Yes N/A N/A Undermining: Grade 3 N/A N/A Classification: MRI N/A N/A Earleen Newport Verification: Large N/A N/A Exudate A mount: Serous N/A N/A Exudate Type: amber N/A N/A Exudate Color: Well defined, not attached N/A N/A Wound Margin: Medium (34-66%) N/A N/A Granulation A mount: Red, Pale N/A N/A Granulation Quality: Medium (34-66%) N/A N/A Necrotic A mount: Fat Layer (Subcutaneous Tissue): Yes N/A N/A Exposed Structures: None N/A N/A Epithelialization: Callus surrounding wound. N/A N/A Assessment Notes: Treatment Notes Wound #5 (Foot) Wound Laterality: Plantar, Right Cleanser Byram Ancillary Kit - 15 Day Supply Discharge Instruction: Use supplies as instructed; Kit contains: (15) Saline Bullets; (15) 3x3 Gauze; 15 pr Gloves Soap and Water Discharge Instruction: Gently cleanse wound with  antibacterial soap, rinse and pat dry prior to dressing wounds Peri-Wound Care Topical Primary Dressing Prisma 4.34 (in) Discharge Instruction: PLEASE PACK DRY INTO WOUND (Undermining 3 cm from 2:00-10:00) Secondary Dressing (BORDER) Zetuvit Plus SILICONE BORDER Dressing 4x4 (in/in) Discharge Instruction: Please do not put silicone bordered dressings under wraps. Use non-bordered dressing only. Secured With Compression Wrap Compression Stockings Environmental education officer) Signed: 03/28/2022 3:12:15 PM By: Gretta Cool, BSN, RN, CWS, [name] RN, BSN Previous Signature: 01/03/2022 5:27:45 PM Version By: Gretta Cool, BSN, RN, CWS, [name] RN, BSN Entered By: Gretta Cool, BSN, RN, CWS, [name] on 03/28/2022 15:12:15 Kulick, Francis Gaines ([id number]) [id number] Page 6 of 9 -------------------------------------------------------------------------------- Multi-Disciplinary Care Plan Details Patient Name: Date of Service: TA [place]. 01/03/2022 8:30 A M Medical Record Number: [id number] Patient Account Number: [id number] Date of Birth/Sex: Treating RN: 07-May-1951 (71 y.o. [place] Primary Care Keiera Strathman: Denton Lank Other Clinician: Referring Mandi Mattioli: Treating Alysah Carton/Extender: Almedia Balls in Treatment: 7 Active Inactive Osteomyelitis Nursing Diagnoses: Infection: osteomyelitis Knowledge deficit related to disease process and management Potential for infection: osteomyelitis Goals: Diagnostic evaluation for osteomyelitis completed as ordered Date Initiated: 12/20/2021 Target Resolution Date: 12/20/2021 Goal Status: Active Patient/caregiver will verbalize understanding of disease process and disease management Date Initiated: 12/20/2021 Target Resolution Date: 12/20/2021 Goal Status: Active Patient's osteomyelitis will resolve Date Initiated: 12/20/2021 Target Resolution Date: 12/20/2021 Goal Status: Active Signs and symptoms for osteomyelitis will be recognized  and promptly addressed Date Initiated: 12/20/2021 Target Resolution Date: 12/20/2021 Goal Status: Active Interventions: Assess for signs and symptoms of osteomyelitis resolution every visit Provide education on osteomyelitis Notes: Wound/Skin Impairment Nursing Diagnoses: Impaired tissue integrity Knowledge deficit related to ulceration/compromised skin integrity Goals: Ulcer/skin breakdown will have a volume reduction of 30% by week 4 Date Initiated: 11/15/2021 Target Resolution Date: 12/13/2021 Goal Status: Active Ulcer/skin breakdown will have a volume reduction of 50% by week 8 Date Initiated: 11/15/2021 Target Resolution Date: 01/10/2022 Goal Status: Active Ulcer/skin breakdown will have a volume reduction of 80% by week 12 Date Initiated: 11/15/2021 Target Resolution Date: 02/07/2022 Goal Status: Active Ulcer/skin breakdown will heal within 14 weeks Date Initiated: 11/15/2021 Target Resolution Date: 02/21/2022 Goal Status: Active Interventions: Assess patient/caregiver ability to obtain necessary supplies Assess patient/caregiver ability to perform ulcer/skin  care regimen upon admission and as needed Assess ulceration(s) every visit Provide education on ulcer and skin care Notes: Electronic Signature(s) Signed: 01/03/2022 5:27:45 PM By: Gretta Cool, BSN, RN, CWS, [name] RN, BSN Entered By: Gretta Cool, BSN, RN, CWS, [name] on 01/03/2022 08:56:54 [place], Francis Gaines ([id number]) [id number] Page 7 of 9 -------------------------------------------------------------------------------- Pain Assessment Details Patient Name: Date of Service: TA [name], Yocelin F. 01/03/2022 8:30 A M Medical Record Number: [id number] Patient Account Number: [id number] Date of Birth/Sex: Treating RN: 26-Dec-1950 (71 y.o. [place] Primary Care Keagan Anthis: Denton Lank Other Clinician: Referring Timaya Bojarski: Treating Tanaysia Bhardwaj/Extender: Almedia Balls in Treatment: 7 Active  Problems Location of Pain Severity and Description of Pain Patient Has Paino No Site Locations Pain Management and Medication Current Pain Management: Electronic Signature(s) Signed: 01/03/2022 5:27:45 PM By: Gretta Cool, BSN, RN, CWS, [name] RN, BSN Entered By: Gretta Cool, BSN, RN, CWS, [name] on 01/03/2022 78:46:96 -------------------------------------------------------------------------------- Patient/Caregiver Education Details Patient Name: Date of Service: [name], Lewanda F. 8/14/2023andnbsp8:30 A M Medical Record Number: [id number] Patient Account Number: [id number] Date of Birth/Gender: Treating RN: 12/08/1950 (71 y.o. [place] Primary Care Physician: Denton Lank Other Clinician: Referring Physician: Treating Physician/Extender: Almedia Balls in Treatment: 7 Koble, Katlin F ([id number]) [phone].pdf Page 8 of 9 Education Assessment Education Provided To: Patient Education Topics Provided Infection: Handouts: Infection Prevention and Management Methods: Explain/Verbal Responses: State content correctly Wound/Skin Impairment: Handouts: Caring for Your Ulcer, Other: dressing changes by Home Health as prescribed Methods: Demonstration, Explain/Verbal Responses: State content correctly Electronic Signature(s) Signed: 01/03/2022 5:27:45 PM By: Gretta Cool, BSN, RN, CWS, [name] RN, BSN Entered By: Gretta Cool, BSN, RN, CWS, [name] on 01/03/2022 09:10:04 -------------------------------------------------------------------------------- Wound Assessment Details Patient Name: Date of Service: [name], Jenavee F. 01/03/2022 8:30 A M Medical Record Number: [id number] Patient Account Number: [id number] Date of Birth/Sex: Treating RN: March 27, 1951 (71 y.o. [name], [name] Primary Care Devell Parkerson: Denton Lank Other Clinician: Referring Twylah Bennetts: Treating Una Yeomans/Extender: Almedia Balls in Treatment: 7 Wound Status Wound Number: 5 Primary Diabetic Wound/Ulcer of  the Lower Extremity Etiology: Wound Location: Right, Plantar Foot Wound Open Wounding Event: Pressure Injury Status: Date Acquired: 06/23/2021 Comorbid Cataracts, Anemia, Hypertension, Type II Diabetes, Osteoarthritis, Weeks Of Treatment: 7 History: Osteomyelitis, Neuropathy, Confinement Anxiety Clustered Wound: No Photos Wound Measurements Length: (cm) 0.4 Width: (cm) 0.5 Depth: (cm) 0.6 Area: (cm) 0.157 Volume: (cm) 0.094 Rubano, Jetaun F ([id number]) % Reduction in Area: -121.1% % Reduction in Volume: -571.4% Epithelialization: None Undermining: Yes Starting Position (o'clock): 12 Ending Position (o'clock): 12 [id number] Page 9 of 9 Maximum Distance: (cm) 3 Wound Description Classification: Grade 3 Wagner Verification: MRI Wound Margin: Well defined, not attached Exudate Amount: Large Exudate Type: Serous Exudate Color: amber Foul Odor After Cleansing: No Slough/Fibrino No Wound Bed Granulation Amount: Medium (34-66%) Exposed Structure Granulation Quality: Red, Pale Fat Layer (Subcutaneous Tissue) Exposed: Yes Necrotic Amount: Medium (34-66%) Necrotic Quality: Adherent Slough Assessment Notes Callus surrounding wound. Electronic Signature(s) Signed: 03/28/2022 3:11:57 PM By: Gretta Cool, BSN, RN, CWS, [name] RN, BSN Previous Signature: 01/03/2022 5:27:45 PM Version By: Gretta Cool, BSN, RN, CWS, [name] RN, BSN Entered By: Gretta Cool, BSN, RN, CWS, [name] on 03/28/2022 15:11:56 -------------------------------------------------------------------------------- Vitals Details Patient Name: Date of Service: [name], Trini F. 01/03/2022 8:30 A M Medical Record Number: [id number] Patient Account Number: [id number] Date of Birth/Sex: Treating RN: 1950/09/22 (71 y.o. [place] Primary Care Juliza Machnik: Denton Lank Other Clinician: Referring Nairi Oswald: Treating Saba Gomm/Extender: Almedia Balls in Treatment: 7 Vital Signs Time Taken: 08:48  Temperature  (F): 98.4 Height (in): 67 Pulse (bpm): 84 Weight (lbs): 248 Respiratory Rate (breaths/min): 18 Body Mass Index (BMI): 38.8 Blood Pressure (mmHg): 124/69 Reference Range: 80 - 120 mg / dl Electronic Signature(s) Signed: 01/03/2022 5:27:45 PM By: Gretta Cool, BSN, RN, CWS, [name] RN, BSN Entered By: Gretta Cool, BSN, RN, CWS, [name] on 01/03/2022 08:48:18

## 2022-01-06 NOTE — Progress Notes (Addendum)
PEARLY, BARTOSIK ([id number]) Visit Report for 01/03/2022 Chief Complaint Document Details Patient Name: [name], [name]. Date of Service: 01/03/2022 8:30 AM Medical Record Number: [id number] Patient Account Number: [id number] Date of Birth/Sex: [birth year] (71 y.o. [name]) Treating RN: Cornell Barman Primary Care Provider: Denton Lank Other Clinician: Referring Provider: Denton Lank Treating Provider/Extender: Jeri Cos Weeks in Treatment: 7 Information Obtained from: Patient Chief Complaint Right Foot Ulcer Electronic Signature(s) Signed: 01/03/2022 8:39:24 AM By: Worthy Keeler PA-C Entered By: Worthy Keeler on 01/03/2022 08:39:24 [name], [name]. ([id number]) -------------------------------------------------------------------------------- HPI Details Patient Name: [name], [name]. Date of Service: 01/03/2022 8:30 AM Medical Record Number: [id number] Patient Account Number: [id number] Date of Birth/Sex: August 21, 1950 (71 y.o. [name]) Treating RN: Cornell Barman Primary Care Provider: Denton Lank Other Clinician: Referring Provider: Denton Lank Treating Provider/Extender: Skipper Cliche in Treatment: 7 History of Present Illness HPI Description: this is a 71 year old morbidly obese patient who has uncontrolled diabetes and has not been taking treatment for diabetes for at least 6 months. She presents with bilateral heel ulcers of only 2 weeks duration. He was sent to [place] by her PCP Dr. Veda Canning. After this the patient was seen both by the podiatrist Dr. Caryl Comes and her vascular surgeon Dr. Delana Meyer. from what I understand from the son Dr. Delana Meyer has done some process on her and a procedure and says that he has finished working on her and no further vascular intervention is to be done. the patient was sent was for a further evaluation of her wounds. Her son has been doing some dressings at home on a twice a daily basis with Silvadene ointment. Some recent blood work done on 07/19/2014 shows that her  hemoglobin was 7.9 with a hematocrit of 25.7. Her white count was 12.7 and her glucose was 175 mg/dL. Her hemoglobin A1c was 8.4%. She has had no recent x-rays available for review. 07/31/2014. I was able to review the office visit notes from Dr. Delana Meyer on 07/14/2014. The patient's ABI on the right was 0.85 and on the left was 0.89 and both were abnormal. His assessment and plan was that the patient had severe atherosclerotic changes of both lower extremities associated with ulceration and tissue loss of the foot and this represent a limb threatening ischemia and he recommended a angiography of the lower extremities with hope for intervention for limb salvage. This was scheduled the procedure was done on 07/22/14 The surgery performed was an abdominal aortogram with the right lower extremity runoff and catheter placement. 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 08/07/2014 x-rays were done of both feet on March 10. The right heel showed soft tissue wound, with some calcaneus lesion suspicious for osteomyelitis. An MRI would be recommended. the left heel did not show any evidence of osteomyelitis. 08/14/2014 -- Reports on Etoy Bhandari : Cardiology reports reviewed from the [facility] clinic by Dr. Nehemiah Massed 2-D echocardiography done on 08/09/2013 showed that her normal LV systolic function with ejection fraction of 55% She also had mild mitral and tricuspid insufficiency. 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  Culture report of the bone shows several organisms which are grown and she has been  put on Bactrim DS one twice a day for 14 days. The patient's bariatric surgeon Dr. Duke Salvia did speak with me today regarding the application of ACell and suggested that he would be happy to apply this in case the patient was ready for it. I had a detailed discussion with him regarding the patient's condition and I have recommended at the present time the patient would benefit from hyperbaric oxygen therapy, IV antibiotics and supportive wound care. if at some stage the wound was ready for application of a cell I would be happy to talk to him about taking over her care. He agreed with the treatment plan and said he would be in touch. Readmission: 11-15-2021 upon evaluation today patient presents for initial inspection here in our clinic concerning issues that she has been having with a wound on the plantar aspect of her right foot. I did review notes as well and it appears that she had a bone spur noted but no signs of osteomyelitis this was in May when she saw Dr. Caryl Comes. With that being said he apparently was recommended surgery to go in and remove the bone spur as he feels like that is the causative reason for the wound and indeed I feel like that may be the case as well based on what I am seeing. Fortunately I do not see any evidence of active infection locally or systemically right now although there is definitely some callus buildup and there is definitely more fluid trapped underneath that is hard to even tell which is what we are seeing currently working have to remove some of this callus in order to identify what is even going on underneath and how deep this really goes. The patient does have a history of diabetes mellitus type 2, diabetic neuropathy, and hypertension. Otherwise there are no major medical problems that would affect her wound healing. Her most recent hemoglobin A1c was on 06-16-2021 and is stated to be good at 6.7 11-29-2021 upon evaluation today patient appears to be doing well  currently in regard to her wound. She has been tolerating the dressing changes without complication. Fortunately there does not appear to be any evidence of active infection locally or systemically which is great news. No fevers, chills, nausea, vomiting, or diarrhea. I do think the Hydrofera Blue rope is doing decently well. With that being said she does have a little callus that builds up I am after cleaning this away as well as cleaning some of the base of the wound as well. She does have her MRI scheduled for later today. 12-20-2021 upon evaluation today patient appears to be doing well currently in regard to her heel ulcer. This is actually significantly improved compared to previous evaluations. I do think that we are on the right track here and we are seeing some definitive improvements. Overall I think that the patient is on the right track. I do not see any evidence of active infection systemically and locally I feel like this is also doing much better. She has been on doxycycline which I placed her on beginning 11-30-2021. This will be for 2 months and she has not yet completed the first month.Marland Kitchen 01-03-2022 upon evaluation today patient appears to be doing well currently in regard to her wound. This is actually showing signs of excellent improvement is noted completely to the surface there is just a little bit of biofilm although we  are can have to clear this away with sharp Aughenbaugh, Dezerae [name]. ([id number]) debridement as she is saline and gauze treatment is not enough to get this loosened today. 01-03-2022 upon evaluation today patient appears to be doing about the same in regard to her wound. The good news is she still on the antibiotics and nothing seems to be getting worse but notices that she is still not really showing a lot of improvement in the undermining area as of yet. Not really what we will try to work on here is get some undermining to reattach them to family. To that end I think that  we may need to be packing this a little bit more tightly with the collagen and doing it dry. If we do this then it will start to moisten and resolve and it will not actually be over packed but hopefully should allow for more significant healing going forward. Patient voiced understanding and is in agreement with that plan. Electronic Signature(s) Signed: 01/03/2022 9:12:10 AM By: Worthy Keeler PA-C Previous Signature: 01/03/2022 8:39:45 AM Version By: Worthy Keeler PA-C Entered By: Worthy Keeler on 01/03/2022 09:12:10 [name], [name]. ([id number]) -------------------------------------------------------------------------------- Physical Exam Details Patient Name: [name], [name]. Date of Service: 01/03/2022 8:30 AM Medical Record Number: [id number] Patient Account Number: [id number] Date of Birth/Sex: 1950/08/05 (71 y.o. [name]) Treating RN: Cornell Barman Primary Care Provider: Denton Lank Other Clinician: Referring Provider: Denton Lank Treating Provider/Extender: Jeri Cos Weeks in Treatment: 7 Constitutional Well-nourished and well-hydrated in no acute distress. Respiratory normal breathing without difficulty. Psychiatric this patient is able to make decisions and demonstrates good insight into disease process. Alert and Oriented x 3. pleasant and cooperative. Notes Upon inspection patient's wound bed actually showed signs of good granulation and authorization at this point. Fortunately there does not appear to be any evidence of infection locally or systemically which is great news and overall I am extremely pleased with where things stand today. Electronic Signature(s) Signed: 01/03/2022 9:12:25 AM By: Worthy Keeler PA-C Entered By: Worthy Keeler on 01/03/2022 09:12:25 [name], Daisy FMarland Kitchen ([id number]) -------------------------------------------------------------------------------- Physician Orders Details Patient Name: [name], [name]. Date of Service: 01/03/2022 8:30 AM Medical Record  Number: [id number] Patient Account Number: [id number] Date of Birth/Sex: 08/13/1950 (71 y.o. 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 o Nurse Visit as needed [place] for wound care. May utilize formulary equivalent dressing for wound treatment orders unless otherwise specified. Home Health Nurse may visit PRN to address patientos wound care needs. o Scheduled days for dressing changes to be completed; exception, patient has scheduled wound care visit that day. o **Please direct any NON-WOUND related issues/requests for orders to patient's Primary Care Physician. **If current dressing causes regression in wound condition, may D/C ordered dressing product/s and apply Normal Saline Moist Dressing daily until next [place] or Other MD appointment. **Notify [facility] of regression in wound condition at [phone]. o Other Home Health Orders/Instructions: - Pack silver collagen lightly into wound undermining 12-12 2.5cm. PLEASE pack  in prisma (silver collagen) DRY and DO NOT moisten as patient is having increased drainage Bathing/ Shower/ Hygiene o Wash wounds with antibacterial soap and water. o May shower; gently cleanse wound with antibacterial soap, rinse and pat dry prior to dressing wounds o No tub bath. Off-Loading o Other: - continue to wear crow walker boot to relieve pressure  off wound Wound Treatment Wound #5 - Foot Wound Laterality: Plantar, Right Cleanser: [place] Ancillary Kit - 15 Day Supply 3 x Per Week/30 Days Discharge Instructions: Use supplies as instructed; Kit contains: (15) Saline Bullets; (15) 3x3 Gauze; 15 pr Gloves Cleanser: Soap and Water 3 x Per Week/30 Days Discharge Instructions: Gently cleanse wound with antibacterial soap, rinse and pat dry prior to dressing wounds Primary Dressing: Prisma 4.34 (in) (Home Health) 3 x Per Week/30 Days Discharge Instructions: PLEASE PACK DRY INTO WOUND (Undermining 3 cm from 2:00-10:00) Secondary Dressing: (BORDER) Zetuvit Plus SILICONE BORDER Dressing 4x4 (in/in) (Home Health) 3 x Per Week/30 Days Discharge Instructions: Please do not put silicone bordered dressings under wraps. Use non-bordered dressing only. Electronic Signature(s) Signed: 01/03/2022 4:45:05 PM By: Worthy Keeler PA-C Signed: 01/03/2022 5:27:45 PM By: Gretta Cool, BSN, RN, CWS, [name] RN, BSN Entered By: Gretta Cool, BSN, RN, CWS, [name] on 01/03/2022 09:08:14 [name], [name] ([id number]) -------------------------------------------------------------------------------- Problem List Details Patient Name: [name], [name]. Date of Service: 01/03/2022 8:30 AM Medical Record Number: [id number] Patient Account Number: [id number] Date of Birth/Sex: [birth year] (71 y.o. 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 ([id number]) -------------------------------------------------------------------------------- Progress Note Details Patient Name: [name], [name]. Date of Service: 01/03/2022 8:30 AM Medical Record Number: [id number] Patient Account Number: [id number] Date of Birth/Sex: 11-11-1950 (71 y.o. [name]) Treating RN: Cornell Barman Primary Care Provider: Denton Lank Other Clinician: Referring Provider: Denton Lank Treating Provider/Extender: Skipper Cliche in Treatment: 7 Subjective Chief Complaint Information obtained from Patient Right Foot Ulcer History of Present Illness (HPI) this is a 71 year old morbidly obese patient who has uncontrolled diabetes and has not been taking treatment for diabetes for at least 6 months. She presents with bilateral heel ulcers of only 2 weeks duration. He was sent to [place] by her PCP Dr. Veda Canning. After this the patient was seen both by the podiatrist Dr. Caryl Comes and her vascular surgeon Dr. Delana Meyer. from what I understand from the son Dr. Delana Meyer has done some process on her and a procedure and says that he has finished working on her and no further vascular intervention is to be done. the patient was sent was for a further evaluation of her wounds. Her son has been doing some dressings at home on a twice a daily basis with Silvadene ointment. Some recent blood work done on 07/19/2014 shows that her hemoglobin was 7.9 with a hematocrit of 25.7. Her white count was 12.7 and her glucose was 175 mg/dL. Her hemoglobin A1c was 8.4%. She has had no recent x-rays available for review.  07/31/2014. I was able to review the office visit notes from Dr. Delana Meyer on 07/14/2014. The patient's ABI on the right was 0.85 and on the left was 0.89 and both were abnormal. His assessment and plan was that the patient had severe  atherosclerotic changes of both lower extremities associated with ulceration and tissue loss of the foot and this represent a limb threatening ischemia and he recommended a angiography of the lower extremities with hope for intervention for limb salvage. This was scheduled the procedure was done on 07/22/14 The surgery performed was an abdominal aortogram with the right lower extremity runoff and catheter placement. 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 08/07/2014 x-rays were done of both feet on March 10. The right heel showed soft tissue wound, with some calcaneus lesion suspicious for osteomyelitis. An MRI would be recommended. the left heel did not show any evidence of osteomyelitis. 08/14/2014 -- Reports on [place] Mian : Cardiology reports reviewed from the [facility] clinic by Dr. Nehemiah Massed 2-D echocardiography done on 08/09/2013 showed that her normal LV systolic function with ejection fraction of 55% She also had mild mitral and tricuspid insufficiency. 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 Culture report of the bone shows several organisms which are grown and she has been put on Bactrim DS one twice a day for 14 days. The patient's bariatric surgeon Dr. Duke Salvia did speak with me today regarding the application of ACell and suggested that he would be happy to apply this in case the patient was ready for it. I had a detailed discussion with him regarding the patient's condition and I have recommended at the present time the patient would benefit from hyperbaric oxygen therapy, IV  antibiotics and supportive wound care. if at some stage the wound was ready for application of a cell I would be happy to talk to him about taking over her care. He agreed with the treatment plan and said he would be in touch. Readmission: 11-15-2021 upon evaluation today patient presents for initial inspection here in our clinic concerning issues that she has been having with a wound on the plantar aspect of her right foot. I did review notes as well and it appears that she had a bone spur noted but no signs of osteomyelitis this was in May when she saw Dr. Caryl Comes. With that being said he apparently was recommended surgery to go in and remove the bone spur as he feels like that is the causative reason for the wound and indeed I feel like that may be the case as well based on what I am seeing. Fortunately I do not see any evidence of active infection locally or systemically right now although there is definitely some callus buildup and there is definitely more fluid trapped underneath that is hard to even tell which is what we are seeing currently working have to remove some of this callus in order to identify what is even going on underneath and how deep this really goes. The patient does have a history of diabetes mellitus type 2, diabetic neuropathy, and hypertension.  Otherwise there are no major medical problems that would affect her wound healing. Her most recent hemoglobin A1c was on 06-16-2021 and is stated to be good at 6.7 11-29-2021 upon evaluation today patient appears to be doing well currently in regard to her wound. She has been tolerating the dressing changes without complication. Fortunately there does not appear to be any evidence of active infection locally or systemically which is great news. No fevers, chills, nausea, vomiting, or diarrhea. I do think the Hydrofera Blue rope is doing decently well. With that being said she does have a little callus that builds up I am after cleaning  this away as well as cleaning some of the base of the wound as well. She does have her MRI scheduled for later today. 12-20-2021 upon evaluation today patient appears to be doing well currently in regard to her heel ulcer. This is actually significantly improved compared to previous evaluations. I do think that we are on the right track here and we are seeing some definitive improvements. Overall I think that the patient is on the right track. I do not see any evidence of active infection systemically and locally I feel like this is also doing much better. She has been on doxycycline which I placed her on beginning 11-30-2021. This will be for 2 months and she has not yet completed the first [name], [name]. ([id number]) month.Marland Kitchen 01-03-2022 upon evaluation today patient appears to be doing well currently in regard to her wound. This is actually showing signs of excellent improvement is noted completely to the surface there is just a little bit of biofilm although we are can have to clear this away with sharp debridement as she is saline and gauze treatment is not enough to get this loosened today. 01-03-2022 upon evaluation today patient appears to be doing about the same in regard to her wound. The good news is she still on the antibiotics and nothing seems to be getting worse but notices that she is still not really showing a lot of improvement in the undermining area as of yet. Not really what we will try to work on here is get some undermining to reattach them to family. To that end I think that we may need to be packing this a little bit more tightly with the collagen and doing it dry. If we do this then it will start to moisten and resolve and it will not actually be over packed but hopefully should allow for more significant healing going forward. Patient voiced understanding and is in agreement with that plan. Objective Constitutional Well-nourished and well-hydrated in no acute distress. Vitals  Time Taken: 8:48 AM, Height: 67 in, Weight: 248 lbs, BMI: 38.8, Temperature: 98.4 [name], Pulse: 84 bpm, Respiratory Rate: 18 breaths/min, Blood Pressure: 124/69 mmHg. Respiratory normal breathing without difficulty. Psychiatric this patient is able to make decisions and demonstrates good insight into disease process. Alert and Oriented x 3. pleasant and cooperative. General Notes: Upon inspection patient's wound bed actually showed signs of good granulation and authorization at this point. Fortunately there does not appear to be any evidence of infection locally or systemically which is great news and overall I am extremely pleased with where things stand today. Integumentary (Hair, Skin) Wound #5 status is Open. Original cause of wound was Pressure Injury. The date acquired was: 06/23/2021. The wound has been in treatment 7 weeks. The wound is located on the [place]. The wound measures 0.4cm length x 0.5cm width x 0.6cm  depth; 0.157cm^2 area and 0.094cm^3 volume. There is Fat Layer (Subcutaneous Tissue) exposed. There is undermining starting at 12:00 and ending at 12:00 with a maximum distance of 3cm. There is a large amount of serous drainage noted. The wound margin is well defined and not attached to the wound base. There is medium (34-66%) red, pale granulation within the wound bed. There is a medium (34-66%) amount of necrotic tissue within the wound bed including Adherent Slough. General Notes: Callus surrounding wound. 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 Nurse Visit as needed Home Health: [place]: - [place] for wound care. May utilize formulary equivalent dressing for  wound treatment orders unless otherwise specified. Home [name], OLIVEA SONNEN ([id number]) Health Nurse may visit PRN to address patient s wound care needs. Scheduled days for dressing changes to be completed; exception, patient has scheduled wound care visit that day. **Please direct any NON-WOUND related issues/requests for orders to patient's Primary Care Physician. **If current dressing causes regression in wound condition, may D/C ordered dressing product/s and apply Normal Saline Moist Dressing daily until next [place] or Other MD appointment. **[facility] of regression in wound condition at [phone]. Other Home Health Orders/Instructions: - Pack silver collagen lightly into wound undermining 12-12 2.5cm. PLEASE pack in prisma (silver collagen) DRY and DO NOT moisten as patient is having increased drainage Bathing/ Shower/ Hygiene: Wash wounds with antibacterial soap and water. May shower; gently cleanse wound with antibacterial soap, rinse and pat dry prior to dressing wounds No tub bath. Off-Loading: Other: - continue to wear crow walker boot to relieve pressure off wound WOUND #5: - Foot Wound Laterality: Plantar, Right Cleanser: [employer] Ancillary Kit - 15 Day Supply 3 x Per Week/30 Days Discharge Instructions: Use supplies as instructed; Kit contains: (15) Saline Bullets; (15) 3x3 Gauze; 15 pr Gloves Cleanser: Soap and Water 3 x Per Week/30 Days Discharge Instructions: Gently cleanse wound with antibacterial soap, rinse and pat dry prior to dressing wounds Primary Dressing: Prisma 4.34 (in) (Home Health) 3 x Per Week/30 Days Discharge Instructions: PLEASE PACK DRY INTO WOUND (Undermining 3 cm from 2:00-10:00) Secondary Dressing: (BORDER) Zetuvit Plus SILICONE BORDER Dressing 4x4 (in/in) (Home Health) 3 x Per Week/30 Days Discharge Instructions: Please do not put silicone bordered dressings under wraps. Use non-bordered dressing only. 1. I am going to  recommend currently that we go ahead and continue with the silver collagen packing which I think is stable to be the best way to go just to make sure this is packed well and do so dry that way when it worsens and will start to dissolve and will not be over packed. 2. Also would recommend that we have the patient continue with the bordered foam dressing to cover. 3. I would also suggest patient continue to monitor for any signs of worsening or infection. 4. I do think the patient could be a candidate for hyperbaric oxygen therapy a lot depends on how things proceed if we can get that approved but again right now I definitely think continuing with the doxycycline is appropriate at this point. We will see patient back for reevaluation in 2 weeks here in the clinic. If anything worsens or changes patient will contact our office for additional recommendations. Electronic Signature(s)  Signed: 01/03/2022 9:13:21 AM By: Worthy Keeler PA-C Entered By: Worthy Keeler on 01/03/2022 09:13:21 Carlson, Breana [name]. ([id number]) -------------------------------------------------------------------------------- SuperBill Details Patient Name: Nyborg, Janah [name]. Date of Service: 01/03/2022 Medical Record Number: [id number] Patient Account Number: [id number] Date of Birth/Sex: 04-08-1951 (71 y.o. 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 I10 Essential (primary) hypertension Facility Procedures CPT4 Code: [id number] Description: 99213 - WOUND CARE VISIT-LEV 3 EST PT Modifier: Quantity: 1 Physician Procedures CPT4 Code:  [id number] Description: 99214 - WC PHYS LEVEL 4 - EST PT Modifier: Quantity: 1 CPT4 Code: Description: ICD-10 Diagnosis Description E11.621 Type 2 diabetes mellitus with foot ulcer L97.512 Non-pressure chronic ulcer of other part of right foot with fat layer e 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

## 2022-01-17 ENCOUNTER — Encounter: Payer: 59 | Admitting: Physician Assistant

## 2022-01-17 DIAGNOSIS — E11621 Type 2 diabetes mellitus with foot ulcer: Secondary | ICD-10-CM | POA: Diagnosis not present

## 2022-01-17 NOTE — Progress Notes (Addendum)
SIENNAH, BARRASSO ([id number]) Visit Report for 01/17/2022 Chief Complaint Document Details Patient Name: [name], [name]. Date of Service: 01/17/2022 2:45 PM Medical Record Number: [id number] Patient Account Number: [id number] Date of Birth/Sex: 05-01-1951 (71 y.o. F) Treating RN: Carlene Coria Primary Care Provider: Denton Lank Other Clinician: Referring Provider: Denton Lank Treating Provider/Extender: Skipper Cliche in Treatment: 9 Information Obtained from: Patient Chief Complaint Right Foot Ulcer Electronic Signature(s) Signed: 01/17/2022 2:59:46 PM By: Worthy Keeler PA-C Entered By: Worthy Keeler on 01/17/2022 14:59:46 [name], [name] ([id number]) -------------------------------------------------------------------------------- HPI Details Patient Name: Albrecht, Elverna F. Date of Service: 01/17/2022 2:45 PM Medical Record Number: [id number] Patient Account Number: [id number] Date of Birth/Sex: 1950-12-15 (71 y.o. F) Treating RN: Carlene Coria Primary Care Provider: Denton Lank Other Clinician: Referring Provider: Denton Lank Treating Provider/Extender: Skipper Cliche in Treatment: 9 History of Present Illness HPI Description: this is a 71 year old morbidly obese patient who has uncontrolled diabetes and has not been taking treatment for diabetes for at least 6 months. She presents with bilateral heel ulcers of only 2 weeks duration. He was sent to [place] by her PCP Dr. Veda Canning. After this the patient was seen both by the podiatrist Dr. Caryl Comes and her vascular surgeon Dr. Delana Meyer. from what I understand from the son Dr. Delana Meyer has done some process on her and a procedure and says that he has finished working on her and no further vascular intervention is to be done. the patient was sent was for a further evaluation of her wounds. Her son has been doing some dressings at home on a twice a daily basis with Silvadene ointment. Some recent blood work done on 07/19/2014 shows that  her hemoglobin was 7.9 with a hematocrit of 25.7. Her white count was 12.7 and her glucose was 175 mg/dL. Her hemoglobin A1c was 8.4%. She has had no recent x-rays available for review. 07/31/2014. I was able to review the office visit notes from Dr. Delana Meyer on 07/14/2014. The patient's ABI on the right was 0.85 and on the left was 0.89 and both were abnormal. His assessment and plan was that the patient had severe atherosclerotic changes of both lower extremities associated with ulceration and tissue loss of the foot and this represent a limb threatening ischemia and he recommended a angiography of the lower extremities with hope for intervention for limb salvage. This was scheduled the procedure was done on 07/22/14 The surgery performed was an abdominal aortogram with the right lower extremity runoff and catheter placement. 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 08/07/2014 x-rays were done of both feet on March 10. The right heel showed soft tissue wound, with some calcaneus lesion suspicious for osteomyelitis. An MRI would be recommended. the left heel did not show any evidence of osteomyelitis. 08/14/2014 -- Reports on Tamirah Mcchesney : Cardiology reports reviewed from the [facility] clinic by Dr. Nehemiah Massed 2-D echocardiography done on 08/09/2013 showed that her normal LV systolic function with ejection fraction of 55% She also had mild mitral and tricuspid insufficiency. 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  Culture report of the bone shows several organisms which are grown and she has  been put on Bactrim DS one twice a day for 14 days. The patient's bariatric surgeon Dr. Duke Salvia did speak with me today regarding the application of ACell and suggested that he would be happy to apply this in case the patient was ready for it. I had a detailed discussion with him regarding the patient's condition and I have recommended at the present time the patient would benefit from hyperbaric oxygen therapy, IV antibiotics and supportive wound care. if at some stage the wound was ready for application of a cell I would be happy to talk to him about taking over her care. He agreed with the treatment plan and said he would be in touch. Readmission: 11-15-2021 upon evaluation today patient presents for initial inspection here in our clinic concerning issues that she has been having with a wound on the plantar aspect of her right foot. I did review notes as well and it appears that she had a bone spur noted but no signs of osteomyelitis this was in May when she saw Dr. Caryl Comes. With that being said he apparently was recommended surgery to go in and remove the bone spur as he feels like that is the causative reason for the wound and indeed I feel like that may be the case as well based on what I am seeing. Fortunately I do not see any evidence of active infection locally or systemically right now although there is definitely some callus buildup and there is definitely more fluid trapped underneath that is hard to even tell which is what we are seeing currently working have to remove some of this callus in order to identify what is even going on underneath and how deep this really goes. The patient does have a history of diabetes mellitus type 2, diabetic neuropathy, and hypertension. Otherwise there are no major medical problems that would affect her wound healing. Her most recent hemoglobin A1c was on 06-16-2021 and is stated to be good at 6.7 11-29-2021 upon evaluation today patient appears to be doing  well currently in regard to her wound. She has been tolerating the dressing changes without complication. Fortunately there does not appear to be any evidence of active infection locally or systemically which is great news. No fevers, chills, nausea, vomiting, or diarrhea. I do think the Hydrofera Blue rope is doing decently well. With that being said she does have a little callus that builds up I am after cleaning this away as well as cleaning some of the base of the wound as well. She does have her MRI scheduled for later today. 12-20-2021 upon evaluation today patient appears to be doing well currently in regard to her heel ulcer. This is actually significantly improved compared to previous evaluations. I do think that we are on the right track here and we are seeing some definitive improvements. Overall I think that the patient is on the right track. I do not see any evidence of active infection systemically and locally I feel like this is also doing much better. She has been on doxycycline which I placed her on beginning 11-30-2021. This will be for 2 months and she has not yet completed the first month.Marland Kitchen 01-03-2022 upon evaluation today patient appears to be doing well currently in regard to her wound. This is actually showing signs of excellent improvement is noted completely to the surface there is just a little bit of biofilm although we  are can have to clear this away with sharp Mcguirk, Margalit F. ([id number]) debridement as she is saline and gauze treatment is not enough to get this loosened today. 01-03-2022 upon evaluation today patient appears to be doing about the same in regard to her wound. The good news is she still on the antibiotics and nothing seems to be getting worse but notices that she is still not really showing a lot of improvement in the undermining area as of yet. Not really what we will try to work on here is get some undermining to reattach them to family. To that end I think  that we may need to be packing this a little bit more tightly with the collagen and doing it dry. If we do this then it will start to moisten and resolve and it will not actually be over packed but hopefully should allow for more significant healing going forward. Patient voiced understanding and is in agreement with that plan. 01-17-2022 upon evaluation today patient appears to be doing about the same with regard to her wound. This is on the plantar aspect of the heel. With that being said it appears that this is not being packed as it should be. We needed to really be filling the undermining space with the collagen she had the dressing change this morning and that is definitely not what was there I think he might see some of the miscommunication in regards to the notes to home health. We will try to clarify that. Electronic Signature(s) Signed: 01/17/2022 3:31:33 PM By: Worthy Keeler PA-C Entered By: Worthy Keeler on 01/17/2022 15:31:32 [name], [name] ([id number]) -------------------------------------------------------------------------------- Physical Exam Details Patient Name: [place], Verneal F. Date of Service: 01/17/2022 2:45 PM Medical Record Number: [id number] Patient Account Number: [id number] Date of Birth/Sex: 10/23/1950 (71 y.o. F) Treating RN: Carlene Coria Primary Care Provider: Denton Lank Other Clinician: Referring Provider: Denton Lank Treating Provider/Extender: Jeri Cos Weeks in Treatment: 9 Constitutional Well-nourished and well-hydrated in no acute distress. Respiratory normal breathing without difficulty. Psychiatric this patient is able to make decisions and demonstrates good insight into disease process. Alert and Oriented x 3. pleasant and cooperative. Notes Upon inspection patient's wound bed actually showed signs of good granulation and epithelization at this point. Fortunately I do not see any evidence of active infection locally or systemically which is  great news and overall I am extremely pleased with where we stand today. Electronic Signature(s) Signed: 01/17/2022 3:31:49 PM By: Worthy Keeler PA-C Entered By: Worthy Keeler on 01/17/2022 15:31:48 [name], [name] ([id number]) -------------------------------------------------------------------------------- Physician Orders Details Patient Name: Larrick, Arvilla F. Date of Service: 01/17/2022 2:45 PM Medical Record Number: [id number] Patient Account Number: [id number] Date of Birth/Sex: Feb 26, 1951 (71 y.o. 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 o Nurse Visit as needed [place] for wound care. May utilize formulary equivalent dressing for wound treatment orders unless otherwise specified. Home Health Nurse may visit PRN to address patientos wound care needs. o Scheduled days for dressing changes to be completed;  exception, patient has scheduled wound care visit that day. o **Please direct any NON-WOUND related issues/requests for orders to patient's Primary Care Physician. **If current dressing causes regression in wound condition, may D/C ordered dressing product/s and apply Normal Saline Moist Dressing daily until next [place] or Other MD appointment. **Notify [facility] of regression in wound condition at [phone]. o Other Home Health Orders/Instructions: - Pack silver collagen  lightly into wound undermining 12-12 2.5cm. PLEASE pack in prisma (silver collagen) DRY and DO NOT moisten as patient is having increased drainage Bathing/ Shower/ Hygiene o Wash wounds with antibacterial soap and water. o May shower; gently cleanse wound with antibacterial soap, rinse and pat dry prior to dressing wounds o No tub bath. Off-Loading o Other: - continue to wear crow walker boot to relieve pressure off wound Wound Treatment Wound #5 - Foot Wound Laterality: Plantar, Right Cleanser: [place] Ancillary Kit - 15 Day Supply 3 x Per Week/30 Days Discharge Instructions: Use supplies as instructed; Kit contains: (15) Saline Bullets; (15) 3x3 Gauze; 15 pr Gloves Cleanser: Soap and Water 3 x Per Week/30 Days Discharge Instructions: Gently cleanse wound with antibacterial soap, rinse and pat dry prior to dressing wounds Primary Dressing: Prisma 4.34 (in) (Home Health) 3 x Per Week/30 Days Discharge Instructions: PLEASE PACK DRY INTO WOUND (Undermining 12 oclock to 12 oclock , deepest point is at heel Secondary Dressing: (BORDER) Zetuvit Plus SILICONE BORDER Dressing 4x4 (in/in) (Home Health) 3 x Per Week/30 Days Discharge Instructions: Please do not put silicone bordered dressings under wraps. Use non-bordered dressing only. Electronic Signature(s) Signed: 01/17/2022 4:28:34 PM By: Carlene Coria RN Signed: 01/17/2022 5:17:38 PM By: Worthy Keeler PA-C Entered By: Carlene Coria on 01/17/2022 15:21:39 [name], [name] ([id number]) -------------------------------------------------------------------------------- Problem List Details Patient Name: [place], Tida F. Date of Service: 01/17/2022 2:45 PM Medical Record Number: [id number] Patient Account Number: [id number] Date of Birth/Sex: 28-Apr-1951 (71 y.o. 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 ([id number]) -------------------------------------------------------------------------------- Progress Note Details Patient Name: [name], [name] Date of Service: 01/17/2022 2:45 PM Medical Record Number: [id number] Patient Account Number: [id number] Date of Birth/Sex: 11/27/1950 (71 y.o. F) Treating RN: Carlene Coria Primary Care Provider: Denton Lank Other Clinician: Referring Provider: Denton Lank Treating Provider/Extender: Skipper Cliche in Treatment: 9 Subjective Chief Complaint Information obtained from Patient Right Foot Ulcer History of Present Illness (HPI) this is a 71 year old morbidly obese patient who has uncontrolled diabetes and has not been taking treatment for diabetes for at least 6 months. She presents with bilateral heel ulcers of only 2 weeks duration. He was sent to [place] by her PCP Dr. Veda Canning. After this the patient was seen both by the podiatrist Dr. Caryl Comes and her vascular surgeon Dr. Delana Meyer. from what I understand from the son Dr. Delana Meyer has done some process on her and a procedure and says that he has finished working on her and no  further vascular intervention is to be done. the patient was sent was for a further evaluation of her wounds. Her son has been doing some dressings at home on a twice a daily basis with Silvadene ointment. Some recent blood work done on 07/19/2014 shows that her  hemoglobin was 7.9 with a hematocrit of 25.7. Her white count was 12.7 and her glucose was 175 mg/dL. Her hemoglobin A1c was 8.4%. She has had no recent x-rays available for review. 07/31/2014. I was able to review the office visit notes from Dr. Delana Meyer on 07/14/2014. The patient's ABI on the right was 0.85 and on the left was 0.89 and both were abnormal. His assessment and plan was that the patient had severe atherosclerotic changes of both lower extremities associated with ulceration and tissue loss of the foot and this represent a limb threatening ischemia and he recommended a angiography of the lower extremities with hope for intervention for limb salvage. This was scheduled the procedure was done on 07/22/14 The surgery performed was an abdominal aortogram with the right lower extremity runoff and catheter placement. 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 08/07/2014 x-rays were done of both feet on March 10. The right heel showed soft tissue wound, with some calcaneus lesion suspicious for osteomyelitis. An MRI would be recommended. the left heel did not show any evidence of osteomyelitis. 08/14/2014 -- Reports on Ranisha Salido : Cardiology reports reviewed from the [facility] clinic by Dr. Nehemiah Massed 2-D echocardiography done on 08/09/2013 showed that her normal LV systolic function with ejection fraction of 55% She also had mild mitral and tricuspid insufficiency. 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 Culture report of the bone shows several organisms which are grown and she has been  put on Bactrim DS one twice a day for 14 days. The patient's bariatric surgeon Dr. Duke Salvia did speak with me today regarding the application of ACell and suggested that he would be happy to apply this in case the patient was ready for it. I had a detailed discussion with him regarding the patient's condition and I have recommended at the present time the patient would benefit from hyperbaric oxygen therapy, IV antibiotics and supportive wound care. if at some stage the wound was ready for application of a cell I would be happy to talk to him about taking over her care. He agreed with the treatment plan and said he would be in touch. Readmission: 11-15-2021 upon evaluation today patient presents for initial inspection here in our clinic concerning issues that she has been having with a wound on the plantar aspect of her right foot. I did review notes as well and it appears that she had a bone spur noted but no signs of osteomyelitis this was in May when she saw Dr. Caryl Comes. With that being said he apparently was recommended surgery to go in and remove the bone spur as he feels like that is the causative reason for the wound and indeed I feel like that may be the case as well based on what I am seeing. Fortunately I  do not see any evidence of active infection locally or systemically right now although there is definitely some callus buildup and there is definitely more fluid trapped underneath that is hard to even tell which is what we are seeing currently working have to remove some of this callus in order to identify what is even going on underneath and how deep this really goes. The patient does have a history of diabetes mellitus type 2, diabetic neuropathy, and hypertension. Otherwise there are no major medical problems that would affect her wound healing. Her most recent hemoglobin A1c was on 06-16-2021 and is stated to be good at 6.7 11-29-2021 upon evaluation today patient appears to be doing well  currently in regard to her wound. She has been tolerating the dressing changes without complication. Fortunately there does not appear to be any evidence of active infection locally or systemically which is great news. No fevers, chills, nausea, vomiting, or diarrhea. I do think the Hydrofera Blue rope is doing decently well. With that being said she does have a little callus that builds up I am after cleaning this away as well as cleaning some of the base of the wound as well. She does have her MRI scheduled for later today. 12-20-2021 upon evaluation today patient appears to be doing well currently in regard to her heel ulcer. This is actually significantly improved compared to previous evaluations. I do think that we are on the right track here and we are seeing some definitive improvements. Overall I think that the patient is on the right track. I do not see any evidence of active infection systemically and locally I feel like this is also doing much better. She has been on doxycycline which I placed her on beginning 11-30-2021. This will be for 2 months and she has not yet completed the first [name], [name] ([id number]) month.Marland Kitchen 01-03-2022 upon evaluation today patient appears to be doing well currently in regard to her wound. This is actually showing signs of excellent improvement is noted completely to the surface there is just a little bit of biofilm although we are can have to clear this away with sharp debridement as she is saline and gauze treatment is not enough to get this loosened today. 01-03-2022 upon evaluation today patient appears to be doing about the same in regard to her wound. The good news is she still on the antibiotics and nothing seems to be getting worse but notices that she is still not really showing a lot of improvement in the undermining area as of yet. Not really what we will try to work on here is get some undermining to reattach them to family. To that end I think that  we may need to be packing this a little bit more tightly with the collagen and doing it dry. If we do this then it will start to moisten and resolve and it will not actually be over packed but hopefully should allow for more significant healing going forward. Patient voiced understanding and is in agreement with that plan. 01-17-2022 upon evaluation today patient appears to be doing about the same with regard to her wound. This is on the plantar aspect of the heel. With that being said it appears that this is not being packed as it should be. We needed to really be filling the undermining space with the collagen she had the dressing change this morning and that is definitely not what was there I think he might see some of the  miscommunication in regards to the notes to home health. We will try to clarify that. Objective Constitutional Well-nourished and well-hydrated in no acute distress. Vitals Time Taken: 3:06 PM, Height: 67 in, Weight: 248 lbs, BMI: 38.8, Temperature: 98.3 F, Pulse: 84 bpm, Respiratory Rate: 18 breaths/min, Blood Pressure: 147/90 mmHg. Respiratory normal breathing without difficulty. Psychiatric this patient is able to make decisions and demonstrates good insight into disease process. Alert and Oriented x 3. pleasant and cooperative. General Notes: Upon inspection patient's wound bed actually showed signs of good granulation and epithelization at this point. Fortunately I do not see any evidence of active infection locally or systemically which is great news and overall I am extremely pleased with where we stand today. Integumentary (Hair, Skin) Wound #5 status is Open. Original cause of wound was Pressure Injury. The date acquired was: 06/23/2021. The wound has been in treatment 9 weeks. The wound is located on the [place]. The wound measures 0.5cm length x 0.5cm width x 0.7cm depth; 0.196cm^2 area and 0.137cm^3 volume. There is Fat Layer (Subcutaneous Tissue)  exposed. There is no tunneling noted, however, there is undermining starting at 12:00 and ending at 12:00 with a maximum distance of 4.2cm. There is a medium amount of serosanguineous drainage noted. The wound margin is well defined and not attached to the wound base. There is large (67-100%) red, pale granulation within the wound bed. There is a small (1-33%) amount of necrotic tissue within the wound bed including Adherent Slough. 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 Nurse Visit as needed [name], [name] ([id number]) Home Health: [facility] for wound care. May utilize formulary equivalent dressing for wound treatment orders unless otherwise specified. Home Health Nurse may visit PRN to address patient s wound care needs. Scheduled days for dressing changes to be completed; exception, patient has scheduled wound care visit that day. **Please direct any NON-WOUND related issues/requests for orders to patient's Primary Care Physician. **If current dressing causes regression in wound condition, may D/C ordered dressing product/s and apply Normal Saline Moist Dressing daily until next [place] or Other MD appointment. **Notify [facility] of regression in wound condition at [phone]. Other Home Health Orders/Instructions: - Pack silver collagen lightly into wound undermining 12-12 2.5cm. PLEASE pack in prisma (silver collagen) DRY and DO NOT moisten as patient is having increased drainage Bathing/ Shower/ Hygiene: Wash wounds with antibacterial soap and water. May shower; gently cleanse wound with antibacterial soap, rinse and pat dry prior to dressing  wounds No tub bath. Off-Loading: Other: - continue to wear crow walker boot to relieve pressure off wound WOUND #5: - Foot Wound Laterality: Plantar, Right Cleanser: [place] Ancillary Kit - 15 Day Supply 3 x Per Week/30 Days Discharge Instructions: Use supplies as instructed; Kit contains: (15) Saline Bullets; (15) 3x3 Gauze; 15 pr Gloves Cleanser: Soap and Water 3 x Per Week/30 Days Discharge Instructions: Gently cleanse wound with antibacterial soap, rinse and pat dry prior to dressing wounds Primary Dressing: Prisma 4.34 (in) (Home Health) 3 x Per Week/30 Days Discharge Instructions: PLEASE PACK DRY INTO WOUND (Undermining 12 oclock to 12 oclock , deepest point is at heel Secondary Dressing: (BORDER) Zetuvit Plus SILICONE BORDER Dressing 4x4 (in/in) (Home Health) 3 x Per Week/30  Days Discharge Instructions: Please do not put silicone bordered dressings under wraps. Use non-bordered dressing only. 1. I am going to recommend that based on what we see and we need to make sure that this is packed into the undermining region. I discussed that with the patient today as well. We did clarify the orders as much as possible to try to help out in this regard. 2. I am would recommend as well that she should try to stay off of is much as possible with that being said I do believe she can go to her nutrition program. I was under the mistaken belief that this was actually a physical therapy program and apparently that is not the case I definitely think physical therapy should be limited at this point with regard to the heel. Nonetheless in the end she I think is can be okay to go to the nutrition program that is something she seems to enjoy and I do not think is going to cause any detriment to her wound healing. We will see patient back for reevaluation in 2 weeks here in the clinic. If anything worsens or changes patient will contact our office for additional recommendations. Electronic  Signature(s) Signed: 01/17/2022 3:32:47 PM By: Worthy Keeler PA-C Entered By: Worthy Keeler on 01/17/2022 15:32:47 [name], [name] ([id number]) -------------------------------------------------------------------------------- SuperBill Details Patient Name: [name], [name] Date of Service: 01/17/2022 Medical Record Number: [id number] Patient Account Number: [id number] Date of Birth/Sex: June 05, 1950 (71 y.o. 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 I10 Essential (primary) hypertension Facility Procedures CPT4 Code: 39030092 Description: [id number] 4101 - WOUND CARE VISIT-LEV 2 EST PT Modifier: Quantity: 1 Physician Procedures CPT4 Code: 6226333 Description: 99213 - WC PHYS LEVEL 3 - EST PT Modifier: Quantity: 1 CPT4 Code: Description: ICD-10 Diagnosis Description E11.621 Type 2 diabetes mellitus with foot ulcer L97.512 Non-pressure chronic ulcer of other part of right foot with fat layer e 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

## 2022-01-17 NOTE — Progress Notes (Addendum)
Daisy, [name] ([id number]) [id number] Page 1 of 8 Visit Report for 01/17/2022 Arrival Information Details Patient Name: Date of Service: [name] 01/17/2022 2:45 PM Medical Record Number: [id number] Patient Account Number: [id number] Date of Birth/Sex: Treating RN: [name] (71 y.o. [name] Primary Care [name]: [name] Other Clinician: Referring [name]: Treating [name]/Extender: [name] in Treatment: 9 Visit Information History Since Last Visit All ordered tests and consults were completed: No Patient Arrived: Wheel Chair Added or deleted any medications: No Arrival Time: 14:59 Any new allergies or adverse reactions: No Accompanied By: self Had a fall or experienced change in No Transfer Assistance: None activities of daily living that may affect Patient Identification Verified: Yes risk of falls: Secondary Verification Process Completed: Yes Signs or symptoms of abuse/neglect since last visito No Patient Requires Transmission-Based No Hospitalized since last visit: No Precautions: Implantable device outside of the clinic excluding No Patient Has Alerts: Yes cellular tissue based products placed in the center Patient Alerts: Patient on Blood Thinner since last visit: ABI 11/15/21 L 1.17 R 1.27 Has Dressing in Place as Prescribed: Yes Pain Present Now: No Electronic Signature(s) Signed: 01/17/2022 4:28:34 PM By: Daisy Coria RN Entered By: [name] on 01/17/2022 15:06:44 -------------------------------------------------------------------------------- Clinic Level of Care Assessment Details Patient Name: Date of Service: [name] 01/17/2022 2:45 PM Medical Record Number: [id number] Patient Account Number: [id number] Date of Birth/Sex: Treating RN: [name] (71 y.o. [name] Primary Care [name]: [name] Other Clinician: Referring [name]: Treating [name]/Extender: [name] in Treatment: 9 Clinic Level of Care Assessment Items TOOL 4 Quantity Score X- 1 0 Use when only an EandM is performed on FOLLOW-UP visit ASSESSMENTS - Nursing Assessment / Reassessment X- 1 10 Reassessment of Co-morbidities (includes updates in patient status) X- 1 5 Reassessment of Adherence to Treatment Plan [name], [name] ([id number]) [id number] Page 2 of 8 ASSESSMENTS - Wound and Skin A ssessment / Reassessment X - Simple Wound Assessment / Reassessment - one wound 1 5 '[]'$  - 0 Complex Wound Assessment / Reassessment - multiple wounds '[]'$  - 0 Dermatologic / Skin Assessment (not related to wound area) ASSESSMENTS - Focused Assessment '[]'$  - 0 Circumferential Edema Measurements - multi extremities '[]'$  - 0 Nutritional Assessment / Counseling / Intervention '[]'$  - 0 Lower Extremity Assessment (monofilament, tuning fork, pulses) '[]'$  - 0 Peripheral Arterial Disease Assessment (using hand held doppler) ASSESSMENTS - Ostomy and/or Continence Assessment and Care '[]'$  - 0 Incontinence Assessment and Management '[]'$  - 0 Ostomy Care Assessment and Management (repouching, etc.) PROCESS - Coordination of Care X - Simple Patient / Family Education for ongoing care 1 15 '[]'$  - 0 Complex (extensive) Patient / Family Education for ongoing care '[]'$  - 0 Staff obtains Programmer, systems, Records, T Results / Process Orders est '[]'$  - 0 Staff telephones HHA, Nursing Homes / Clarify orders / etc '[]'$  - 0 Routine Transfer to another Facility (non-emergent condition) '[]'$  - 0 Routine Hospital Admission (non-emergent condition) '[]'$  - 0 New Admissions / Biomedical engineer / Ordering NPWT Apligraf, etc. , '[]'$  - 0 Emergency Hospital Admission (emergent condition) X- 1 10 Simple Discharge Coordination '[]'$  - 0 Complex (extensive) Discharge Coordination PROCESS - Special Needs '[]'$  - 0 Pediatric / Minor Patient Management '[]'$  - 0 Isolation Patient Management '[]'$  -  0 Hearing / Language / Visual special needs '[]'$  - 0 Assessment of Community assistance (transportation, D/C planning, etc.) '[]'$  - 0 Additional assistance / Altered mentation '[]'$  -  0 Support Surface(s) Assessment (bed, cushion, seat, etc.) INTERVENTIONS - Wound Cleansing / Measurement X - Simple Wound Cleansing - one wound 1 5 '[]'$  - 0 Complex Wound Cleansing - multiple wounds X- 1 5 Wound Imaging (photographs - any number of wounds) '[]'$  - 0 Wound Tracing (instead of photographs) X- 1 5 Simple Wound Measurement - one wound '[]'$  - 0 Complex Wound Measurement - multiple wounds INTERVENTIONS - Wound Dressings X - Small Wound Dressing one or multiple wounds 1 10 '[]'$  - 0 Medium Wound Dressing one or multiple wounds '[]'$  - 0 Large Wound Dressing one or multiple wounds '[]'$  - 0 Application of Medications - topical '[]'$  - 0 Application of Medications - injection INTERVENTIONS - Miscellaneous '[]'$  - 0 External ear exam [name], [name] ([id number]) [id number] Page 3 of 8 '[]'$  - 0 Specimen Collection (cultures, biopsies, blood, body fluids, etc.) '[]'$  - 0 Specimen(s) / Culture(s) sent or taken to Lab for analysis '[]'$  - 0 Patient Transfer (multiple staff / Harrel Lemon Lift / Similar devices) '[]'$  - 0 Simple Staple / Suture removal (25 or less) '[]'$  - 0 Complex Staple / Suture removal (26 or more) '[]'$  - 0 Hypo / Hyperglycemic Management (close monitor of Blood Glucose) '[]'$  - 0 Ankle / Brachial Index (ABI) - do not check if billed separately X- 1 5 Vital Signs Has the patient been seen at the hospital within the last three years: Yes Total Score: 75 Level Of Care: New/Established - Level 2 Electronic Signature(s) Signed: 01/17/2022 4:28:34 PM By: Daisy Coria RN Entered By: [name] on 01/17/2022 15:34:43 -------------------------------------------------------------------------------- Encounter Discharge Information Details Patient Name: Date of Service: Daisy Piedad [name], [name]  01/17/2022 2:45 PM Medical Record Number: [id number] Patient Account Number: [id number] Date of Birth/Sex: Treating RN: [name] (71 y.o. [name] Primary Care Dlynn Ranes: [name] Other Clinician: Referring Floreen Teegarden: Treating Sanayah Munro/Extender: [name] in Treatment: 9 Encounter Discharge Information Items Discharge Condition: Stable Ambulatory Status: Wheelchair Discharge Destination: Home Transportation: Private Auto Accompanied By: self Schedule Follow-up Appointment: Yes Clinical Summary of Care: Electronic Signature(s) Signed: 01/17/2022 4:28:34 PM By: Daisy Coria RN Entered By: [name] on 01/17/2022 15:35:34 Lower Extremity Assessment Details -------------------------------------------------------------------------------- Izetta Dakin ([id number]) [id number] Page 4 of 8 Patient Name: Date of Service: [name] 01/17/2022 2:45 PM Medical Record Number: [id number] Patient Account Number: [id number] Date of Birth/Sex: Treating RN: [name] (71 y.o. [name] Primary Care Alleene Stoy: [name] Other Clinician: Referring Shandi Godfrey: Treating Lizmarie Witters/Extender: [name] in Treatment: 9 Vascular Assessment Left: Right: Pulses: Dorsalis Pedis Palpable: Yes Electronic Signature(s) Signed: 01/17/2022 4:28:34 PM By: Daisy Coria RN Entered By: [name] on 01/17/2022 15:12:40 -------------------------------------------------------------------------------- Multi Wound Chart Details Patient Name: Date of Service: Daisy Piedad [name], Jeany F. 01/17/2022 2:45 PM Medical Record Number: [id number] Patient Account Number: [id number] Date of Birth/Sex: Treating RN: [birth year] [name] (71 y.o. [name] Primary Care Orella Cushman: [name] Other Clinician: Referring Deseree Zemaitis: Treating Shaili Donalson/Extender: [name] in Treatment: 9 Vital Signs Height(in): 76 Pulse(bpm):  35 Weight(lbs): 248 Blood Pressure(mmHg): 147/90 Body Mass Index(BMI): 38.8 Temperature(F): 98.3 Respiratory Rate(breaths/min): 18 [5:Photos:] [N/A:N/A] Right, Plantar Foot N/A N/A Wound Location: Pressure Injury N/A N/A Wounding Event: Diabetic Wound/Ulcer of the Lower N/A N/A Primary Etiology: Extremity Cataracts, Anemia, Hypertension, N/A N/A Comorbid History: Type II Diabetes, Osteoarthritis, Osteomyelitis, Neuropathy, Confinement Anxiety 06/23/2021 N/A N/A Date Acquired: 9 N/A N/A Weeks of Treatment: Open N/A N/A Wound Status: No N/A N/A Wound Recurrence: 0.5x0.5x0.7 N/A  N/A Measurements L x W x D (cm) 0.196 N/A N/A A (cm) : rea 0.137 N/A N/A Volume (cm) : -176.10% N/A N/A % Reduction in A rea: -878.60% N/A N/A % Reduction in Volume: 12 Starting Position 1 (o'clock): Florer, Gwendolen F ([id number]) [id number] Page 5 of 8 12 Ending Position 1 (o'clock): 1.5 Maximum Distance 1 (cm): Yes N/A N/A Undermining: Grade 2 N/A N/A Classification: Medium N/A N/A Exudate A mount: Serosanguineous N/A N/A Exudate Type: red, brown N/A N/A Exudate Color: Well defined, not attached N/A N/A Wound Margin: Large (67-100%) N/A N/A Granulation A mount: Red, Pale N/A N/A Granulation Quality: Small (1-33%) N/A N/A Necrotic A mount: Fat Layer (Subcutaneous Tissue): Yes N/A N/A Exposed Structures: None N/A N/A Epithelialization: Treatment Notes Electronic Signature(s) Signed: 01/17/2022 4:28:34 PM By: Daisy Coria RN Entered By: [name] on 01/17/2022 15:13:26 -------------------------------------------------------------------------------- Multi-Disciplinary Care Plan Details Patient Name: Date of Service: Daisy Piedad [name], Roselynne F. 01/17/2022 2:45 PM Medical Record Number: [id number] Patient Account Number: [id number] Date of Birth/Sex: Treating RN: [birth year] [name] (71 y.o. [name] Primary Care Ayrton Mcvay: [name] Other  Clinician: Referring Jamar Weatherall: Treating Dushaun Okey/Extender: [name] in Treatment: 9 Active Inactive Wound/Skin Impairment Nursing Diagnoses: Impaired tissue integrity Knowledge deficit related to ulceration/compromised skin integrity Goals: Ulcer/skin breakdown will have a volume reduction of 30% by week 4 Date Initiated: 11/15/2021 Date Inactivated: 01/17/2022 Target Resolution Date: 12/13/2021 Goal Status: Unmet Unmet Reason: comorbities Ulcer/skin breakdown will have a volume reduction of 50% by week 8 Date Initiated: 11/15/2021 Date Inactivated: 01/17/2022 Target Resolution Date: 01/10/2022 Goal Status: Unmet Unmet Reason: comorbities Ulcer/skin breakdown will have a volume reduction of 80% by week 12 Date Initiated: 11/15/2021 Target Resolution Date: 02/07/2022 Goal Status: Active Ulcer/skin breakdown will heal within 14 weeks Date Initiated: 11/15/2021 Target Resolution Date: 02/21/2022 Goal Status: Active Interventions: Assess patient/caregiver ability to obtain necessary supplies Assess patient/caregiver ability to perform ulcer/skin care regimen upon admission and as needed Assess ulceration(s) every visit Provide education on ulcer and skin care Notes: MADGE, THERRIEN ([id number]) [id number] Page 6 of 8 Electronic Signature(s) Signed: 01/17/2022 4:28:34 PM By: Daisy Coria RN Entered By: [name] on 01/17/2022 15:13:20 -------------------------------------------------------------------------------- Pain Assessment Details Patient Name: Date of Service: CHERICE, GLENNIE 01/17/2022 2:45 PM Medical Record Number: [id number] Patient Account Number: [id number] Date of Birth/Sex: Treating RN: [name][birth year] (71 y.o. [name] Primary Care Jarel Cuadra: [name] Other Clinician: Referring Chistine Dematteo: Treating Kadin Bera/Extender: [name] in Treatment: 9 Active Problems Location of Pain Severity and  Description of Pain Patient Has Paino No Site Locations Pain Management and Medication Current Pain Management: Electronic Signature(s) Signed: 01/17/2022 4:28:34 PM By: Daisy Coria RN Entered By: [name] on 01/17/2022 15:07:31 -------------------------------------------------------------------------------- Patient/Caregiver Education Details Patient Name: Date of Service: Daisy PP, Ahuva F. 8/28/2023andnbsp2:45 PM Sago, Seraphine F ([id number]) [id number] Page 7 of 8 Medical Record Number: [id number] Patient Account Number: [id number] Date of Birth/Gender: Treating RN: [name] (71 y.o. [name] Primary Care Physician: [name] Other Clinician: Referring Physician: Treating Physician/Extender: [name] in Treatment: 9 Education Assessment Education Provided To: Patient Education Topics Provided Wound/Skin Impairment: Methods: Explain/Verbal Responses: State content correctly [employer]) Signed: 01/17/2022 4:28:34 PM By: Daisy Coria RN Entered By: [name] on 01/17/2022 15:34:59 -------------------------------------------------------------------------------- Wound Assessment Details Patient Name: Date of Service: Daisy ALYSABETH, SCALIA. 01/17/2022 2:45 PM Medical Record Number: [id number] Patient Account Number: [id number] Date of Birth/Sex: Treating RN: Sep [birth year], [name] (71 y.o. F) Epps, Carrie Primary  Care Matina Rodier: [name] Other Clinician: Referring Curvin Hunger: Treating Enijah Furr/Extender: [name] in Treatment: 9 Wound Status Wound Number: 5 Primary Diabetic Wound/Ulcer of the Lower Extremity Etiology: Wound Location: Right, Plantar Foot Wound Open Wounding Event: Pressure Injury Status: Date Acquired: 06/23/2021 Comorbid Cataracts, Anemia, Hypertension, Type II Diabetes, Osteoarthritis, Weeks Of Treatment: 9 History: Osteomyelitis, Neuropathy, Confinement Anxiety Clustered  Wound: No Photos Wound Measurements Length: (cm) 0.5 Width: (cm) 0.5 Depth: (cm) 0.7 Area: (cm) 0.196 Volume: (cm) 0.137 Shutter, Terena F ([id number]) % Reduction in Area: -176.1% % Reduction in Volume: -878.6% Epithelialization: None Tunneling: No Undermining: Yes Starting Position (o'clock): 12 [id number] Page 8 of 8 Ending Position (o'clock): 12 Maximum Distance: (cm) 4.2 Wound Description Classification: Grade 3 Wagner Verification: MRI Wound Margin: Well defined, not attached Exudate Amount: Medium Exudate Type: Serosanguineous Exudate Color: red, brown Foul Odor After Cleansing: No Slough/Fibrino No Wound Bed Granulation Amount: Large (67-100%) Exposed Structure Granulation Quality: Red, Pale Fat Layer (Subcutaneous Tissue) Exposed: Yes Necrotic Amount: Small (1-33%) Necrotic Quality: Adherent Therapist, music) Signed: 03/28/2022 3:12:39 PM By: Gretta Cool, BSN, RN, CWS, [name] RN, BSN Signed: 05/20/2022 11:06:01 AM By: Daisy Coria RN Previous Signature: 01/17/2022 4:28:34 PM Version By: Daisy Coria RN Entered By: Gretta Cool, BSN, RN, CWS, [name] on 03/28/2022 15:12:39 -------------------------------------------------------------------------------- Vitals Details Patient Name: Date of Service: Daisy Piedad [name], Mercadies F. 01/17/2022 2:45 PM Medical Record Number: [id number] Patient Account Number: [id number] Date of Birth/Sex: Treating RN: [birth year] (71 y.o. [name] Primary Care Payeton Germani: [name] Other Clinician: Referring Alexxa Sabet: Treating Maryan Sivak/Extender: [name] in Treatment: 9 Vital Signs Time Taken: 15:06 Temperature (F): 98.3 Height (in): 67 Pulse (bpm): 84 Weight (lbs): 248 Respiratory Rate (breaths/min): 18 Body Mass Index (BMI): 38.8 Blood Pressure (mmHg): 147/90 Reference Range: 80 - 120 mg / dl Electronic Signature(s) Signed: 01/17/2022 4:28:34 PM By: Daisy Coria RN Entered By: [name]  on 01/17/2022 15:07:22

## 2022-01-31 ENCOUNTER — Ambulatory Visit: Payer: 59 | Admitting: Physician Assistant

## 2022-02-14 ENCOUNTER — Encounter: Payer: 59 | Attending: Physician Assistant | Admitting: Physician Assistant

## 2022-02-14 DIAGNOSIS — M1990 Unspecified osteoarthritis, unspecified site: Secondary | ICD-10-CM | POA: Diagnosis not present

## 2022-02-14 DIAGNOSIS — I10 Essential (primary) hypertension: Secondary | ICD-10-CM | POA: Diagnosis not present

## 2022-02-14 DIAGNOSIS — E1151 Type 2 diabetes mellitus with diabetic peripheral angiopathy without gangrene: Secondary | ICD-10-CM | POA: Diagnosis not present

## 2022-02-14 DIAGNOSIS — Z6838 Body mass index (BMI) 38.0-38.9, adult: Secondary | ICD-10-CM | POA: Diagnosis not present

## 2022-02-14 DIAGNOSIS — L97512 Non-pressure chronic ulcer of other part of right foot with fat layer exposed: Secondary | ICD-10-CM | POA: Insufficient documentation

## 2022-02-14 DIAGNOSIS — E1143 Type 2 diabetes mellitus with diabetic autonomic (poly)neuropathy: Secondary | ICD-10-CM | POA: Diagnosis not present

## 2022-02-14 DIAGNOSIS — E11621 Type 2 diabetes mellitus with foot ulcer: Secondary | ICD-10-CM | POA: Insufficient documentation

## 2022-02-14 NOTE — Progress Notes (Addendum)
CHEVY, VIRGO ([id number]) [id number] Page 1 of 8 Visit Report for 02/14/2022 Arrival Information Details Patient Name: Date of Service: [name], [name]. 02/14/2022 9:00 A M Medical Record Number: [id number] Patient Account Number: [id number] Date of Birth/Sex: Treating RN: [birth year] (71 y.o. [name], [name] Primary Care Jacon Whetzel: Denton Lank Other Clinician: Massie Kluver Referring Chaney Maclaren: Treating Tierre Netto/Extender: Almedia Balls in Treatment: 79 Visit Information History Since Last Visit All ordered tests and consults were completed: No Patient Arrived: Wheel Chair Added or deleted any medications: No Arrival Time: 09:11 Any new allergies or adverse reactions: No Transfer Assistance: EasyPivot Patient Lift Had a fall or experienced change in No Patient Requires Transmission-Based No activities of daily living that may affect Precautions: risk of falls: Patient Has Alerts: Yes Hospitalized since last visit: No Patient Alerts: Patient on Blood Thinner Pain Present Now: No ABI 11/15/21 L 1.17 R 1.27 Electronic Signature(s) Signed: 02/14/2022 5:09:23 PM By: Massie Kluver Entered By: Massie Kluver on 02/14/2022 09:16:10 -------------------------------------------------------------------------------- Clinic Level of Care Assessment Details Patient Name: Date of Service: Daisy [name], [name]. 02/14/2022 9:00 A M Medical Record Number: [id number] Patient Account Number: [id number] Date of Birth/Sex: Treating RN: 10-May-1951 (71 y.o. [name] Primary Care Man Bonneau: Denton Lank Other Clinician: Massie Kluver Referring Hersey Maclellan: Treating Ralene Gasparyan/Extender: Almedia Balls in Treatment: 13 Clinic Level of Care Assessment Items TOOL 4 Quantity Score '[]'$  - 0 Use when only an EandM is performed on FOLLOW-UP visit ASSESSMENTS - Nursing Assessment / Reassessment X- 1 10 Reassessment of Co-morbidities (includes updates in  patient status) X- 1 5 Reassessment of Adherence to Treatment Plan ASSESSMENTS - Wound and Skin A ssessment / Reassessment X - Simple Wound Assessment / Reassessment - one wound 1 5 '[]'$  - 0 Complex Wound Assessment / Reassessment - multiple wounds [name], [name] ([id number]) [id number] Page 2 of 8 '[]'$  - 0 Dermatologic / Skin Assessment (not related to wound area) ASSESSMENTS - Focused Assessment '[]'$  - 0 Circumferential Edema Measurements - multi extremities '[]'$  - 0 Nutritional Assessment / Counseling / Intervention '[]'$  - 0 Lower Extremity Assessment (monofilament, tuning fork, pulses) '[]'$  - 0 Peripheral Arterial Disease Assessment (using hand held doppler) ASSESSMENTS - Ostomy and/or Continence Assessment and Care '[]'$  - 0 Incontinence Assessment and Management '[]'$  - 0 Ostomy Care Assessment and Management (repouching, etc.) PROCESS - Coordination of Care X - Simple Patient / Family Education for ongoing care 1 15 '[]'$  - 0 Complex (extensive) Patient / Family Education for ongoing care '[]'$  - 0 Staff obtains Programmer, systems, Records, T Results / Process Orders est '[]'$  - 0 Staff telephones HHA, Nursing Homes / Clarify orders / etc '[]'$  - 0 Routine Transfer to another Facility (non-emergent condition) '[]'$  - 0 Routine Hospital Admission (non-emergent condition) '[]'$  - 0 New Admissions / Biomedical engineer / Ordering NPWT Apligraf, etc. , '[]'$  - 0 Emergency Hospital Admission (emergent condition) X- 1 10 Simple Discharge Coordination '[]'$  - 0 Complex (extensive) Discharge Coordination PROCESS - Special Needs '[]'$  - 0 Pediatric / Minor Patient Management '[]'$  - 0 Isolation Patient Management '[]'$  - 0 Hearing / Language / Visual special needs '[]'$  - 0 Assessment of Community assistance (transportation, D/C planning, etc.) '[]'$  - 0 Additional assistance / Altered mentation '[]'$  - 0 Support Surface(s) Assessment (bed, cushion, seat, etc.) INTERVENTIONS - Wound Cleansing /  Measurement X - Simple Wound Cleansing - one wound 1 5 '[]'$  - 0 Complex Wound Cleansing - multiple wounds X- 1 5 Wound Imaging (photographs -  any number of wounds) '[]'$  - 0 Wound Tracing (instead of photographs) X- 1 5 Simple Wound Measurement - one wound '[]'$  - 0 Complex Wound Measurement - multiple wounds INTERVENTIONS - Wound Dressings '[]'$  - 0 Small Wound Dressing one or multiple wounds X- 1 15 Medium Wound Dressing one or multiple wounds '[]'$  - 0 Large Wound Dressing one or multiple wounds '[]'$  - 0 Application of Medications - topical '[]'$  - 0 Application of Medications - injection INTERVENTIONS - Miscellaneous '[]'$  - 0 External ear exam '[]'$  - 0 Specimen Collection (cultures, biopsies, blood, body fluids, etc.) '[]'$  - 0 Specimen(s) / Culture(s) sent or taken to Lab for analysis [name], [name] ([id number]) [id number] Page 3 of 8 '[]'$  - 0 Patient Transfer (multiple staff / Civil Service fast streamer / Similar devices) '[]'$  - 0 Simple Staple / Suture removal (25 or less) '[]'$  - 0 Complex Staple / Suture removal (26 or more) '[]'$  - 0 Hypo / Hyperglycemic Management (close monitor of Blood Glucose) '[]'$  - 0 Ankle / Brachial Index (ABI) - do not check if billed separately X- 1 5 Vital Signs Has the patient been seen at the hospital within the last three years: Yes Total Score: 80 Level Of Care: New/Established - Level 3 Electronic Signature(s) Signed: 02/14/2022 5:09:23 PM By: Massie Kluver Entered By: Massie Kluver on 02/14/2022 09:41:18 -------------------------------------------------------------------------------- Encounter Discharge Information Details Patient Name: Date of Service: Daisy [name], [name]. 02/14/2022 9:00 [place] Record Number: [id number] Patient Account Number: [id number] Date of Birth/Sex: Treating RN: 07/04/1950 (71 y.o. [name] Primary Care Marsheila Alejo: Denton Lank Other Clinician: Massie Kluver Referring Lanaysia Fritchman: Treating Maritta Kief/Extender: Almedia Balls in Treatment: 13 Encounter Discharge Information Items Discharge Condition: Stable Ambulatory Status: Wheelchair Discharge Destination: Skilled Nursing Facility Telephoned: Yes Orders Sent: Yes Transportation: Other Accompanied By: self Schedule Follow-up Appointment: Yes Clinical Summary of Care: Electronic Signature(s) Signed: 02/14/2022 5:09:23 PM By: Massie Kluver Entered By: Massie Kluver on 02/14/2022 10:00:24 -------------------------------------------------------------------------------- Lower Extremity Assessment Details Patient Name: Date of Service: Daisy [name], [name]. 02/14/2022 9:00 A M Medical Record Number: [id number] Patient Account Number: [id number] Date of Birth/Sex: Treating RN: 02/06/1951 (71 y.o. [address] [place], [place] [name] ([id number]) [phone].pdf Page 4 of 8 Primary Care Mavrik Bynum: Denton Lank Other Clinician: Massie Kluver Referring Lesieli Bresee: Treating Alaysia Lightle/Extender: Almedia Balls in Treatment: 13 Electronic Signature(s) Signed: 02/14/2022 5:09:23 PM By: Massie Kluver Signed: 02/14/2022 5:56:59 PM By: Gretta Cool BSN, RN, CWS, [name] RN, BSN Entered By: Massie Kluver on 02/14/2022 09:29:16 -------------------------------------------------------------------------------- Multi Wound Chart Details Patient Name: Date of Service: Daisy [name], [name]. 02/14/2022 9:00 A M Medical Record Number: [id number] Patient Account Number: [id number] Date of Birth/Sex: Treating RN: [birth year] (71 y.o. [name] Primary Care Lacosta Hargan: Denton Lank Other Clinician: Massie Kluver Referring Tashawnda Bleiler: Treating Ryan Palermo/Extender: Almedia Balls in Treatment: 13 Vital Signs Height(in): 15 Pulse(bpm): 45 Weight(lbs): 248 Blood Pressure(mmHg): 157/77 Body Mass Index(BMI): 38.8 Temperature([name]): 84 Respiratory Rate(breaths/min): 18 [5:Photos:] [N/A:N/A] Right, Plantar Foot N/A N/A Wound  Location: Pressure Injury N/A N/A Wounding Event: Diabetic Wound/Ulcer of the Lower N/A N/A Primary Etiology: Extremity Cataracts, Anemia, Hypertension, N/A N/A Comorbid History: Type II Diabetes, Osteoarthritis, Osteomyelitis, Neuropathy, Confinement Anxiety 06/23/2021 N/A N/A Date Acquired: 13 N/A N/A Weeks of Treatment: Open N/A N/A Wound Status: No N/A N/A Wound Recurrence: 0.5x0.6x0.5 N/A N/A Measurements L x W x D (cm) 0.236 N/A N/A A (cm) : rea 0.118 N/A N/A Volume (cm) : -232.40% N/A N/A % Reduction  in A rea: -742.90% N/A N/A % Reduction in Volume: 1 Starting Position 1 (o'clock): 8 Ending Position 1 (o'clock): 1.6 Maximum Distance 1 (cm): Yes N/A N/A Undermining: Grade 2 N/A N/A Classification: Medium N/A N/A Exudate A mount: Serosanguineous N/A N/A Exudate Type: red, brown N/A N/A Exudate Color: Well defined, not attached N/A N/A Wound Margin: Large (67-100%) N/A N/A Granulation A mount: [name], [name] ([id number]) [id number] Page 5 of 8 Red, Pale N/A N/A Granulation Quality: Small (1-33%) N/A N/A Necrotic A mount: Fat Layer (Subcutaneous Tissue): Yes N/A N/A Exposed Structures: None N/A N/A Epithelialization: Treatment Notes Electronic Signature(s) Signed: 02/14/2022 5:09:23 PM By: Massie Kluver Entered By: Massie Kluver on 02/14/2022 09:29:30 -------------------------------------------------------------------------------- Multi-Disciplinary Care Plan Details Patient Name: Date of Service: Daisy [name], Alfrieda [name]. 02/14/2022 9:00 A M Medical Record Number: [id number] Patient Account Number: [id number] Date of Birth/Sex: Treating RN: 1951-02-18 (71 y.o. [name], [name] Primary Care Mavric Cortright: Denton Lank Other Clinician: Massie Kluver Referring Linzie Boursiquot: Treating Olinda Nola/Extender: Almedia Balls in Treatment: 13 Active Inactive Wound/Skin Impairment Nursing Diagnoses: Impaired tissue  integrity Knowledge deficit related to ulceration/compromised skin integrity Goals: Ulcer/skin breakdown will have a volume reduction of 30% by week 4 Date Initiated: 11/15/2021 Date Inactivated: 01/17/2022 Target Resolution Date: 12/13/2021 Goal Status: Unmet Unmet Reason: comorbities Ulcer/skin breakdown will have a volume reduction of 50% by week 8 Date Initiated: 11/15/2021 Date Inactivated: 01/17/2022 Target Resolution Date: 01/10/2022 Goal Status: Unmet Unmet Reason: comorbities Ulcer/skin breakdown will have a volume reduction of 80% by week 12 Date Initiated: 11/15/2021 Target Resolution Date: 02/07/2022 Goal Status: Active Ulcer/skin breakdown will heal within 14 weeks Date Initiated: 11/15/2021 Target Resolution Date: 02/21/2022 Goal Status: Active Interventions: Assess patient/caregiver ability to obtain necessary supplies Assess patient/caregiver ability to perform ulcer/skin care regimen upon admission and as needed Assess ulceration(s) every visit Provide education on ulcer and skin care Notes: Electronic Signature(s) Signed: 02/14/2022 5:09:23 PM By: Massie Kluver Signed: 02/14/2022 5:56:59 PM By: Gretta Cool, BSN, RN, CWS, [name] RN, BSN Entered By: Massie Kluver on 02/14/2022 09:29:21 [name], [name] ([id number]) [id number] Page 6 of 8 -------------------------------------------------------------------------------- Pain Assessment Details Patient Name: Date of Service: Daisy [place]. 02/14/2022 9:00 A M Medical Record Number: [id number] Patient Account Number: [id number] Date of Birth/Sex: Treating RN: [birth year] (71 y.o. [name] Primary Care Cherokee Clowers: Denton Lank Other Clinician: Massie Kluver Referring Inga Noller: Treating Braylen Staller/Extender: Almedia Balls in Treatment: 13 Active Problems Location of Pain Severity and Description of Pain Patient Has Paino No Site Locations Pain Management and Medication Current Pain  Management: Electronic Signature(s) Signed: 02/14/2022 5:09:23 PM By: Massie Kluver Signed: 02/14/2022 5:56:59 PM By: Gretta Cool, BSN, RN, CWS, [name] RN, BSN Entered By: Massie Kluver on 02/14/2022 09:26:54 -------------------------------------------------------------------------------- Patient/Caregiver Education Details Patient Name: Date of Service: Daisy Daisy Conroy. 9/25/2023andnbsp9:00 A M Medical Record Number: [id number] Patient Account Number: [id number] Date of Birth/Gender: Treating RN: 1950-06-20 (71 y.o. [name] Primary Care Physician: Denton Lank Other Clinician: Massie Kluver Referring Physician: Treating Physician/Extender: Almedia Balls in Treatment: [address] [place] [name] ([id number]) [phone].pdf Page 7 of 8 Education Assessment Education Provided To: Patient Education Topics Provided Wound/Skin Impairment: Handouts: Other: continue wound care as directed Methods: Explain/Verbal Responses: State content correctly Electronic Signature(s) Signed: 02/14/2022 5:09:23 PM By: Massie Kluver Entered By: Massie Kluver on 02/14/2022 09:42:12 -------------------------------------------------------------------------------- Wound Assessment Details Patient Name: Date of Service: Daisy [name], [name]. 02/14/2022 9:00 [place] Record Number: [id number] Patient Account Number: [id number] Date  of Birth/Sex: Treating RN: 1950/11/04 (71 y.o. [name], [name] Primary Care Daijon Wenke: Denton Lank Other Clinician: Massie Kluver Referring Nayel Purdy: Treating Lorn Butcher/Extender: Almedia Balls in Treatment: 13 Wound Status Wound Number: 5 Primary Diabetic Wound/Ulcer of the Lower Extremity Etiology: Wound Location: Right, Plantar Foot Wound Open Wounding Event: Pressure Injury Status: Date Acquired: 06/23/2021 Comorbid Cataracts, Anemia, Hypertension, Type II Diabetes, Osteoarthritis, Weeks Of Treatment: 13 History: Osteomyelitis,  Neuropathy, Confinement Anxiety Clustered Wound: No Photos Wound Measurements Length: (cm) 0.5 Width: (cm) 0.6 Depth: (cm) 0.5 Area: (cm) 0.236 Volume: (cm) 0.118 [name], [name] ([id number]) Wound Description Classification: Grade 3 Wagner Verification: MRI Wound Margin: Well defined, not attached Exudate Amount: Medium Exudate Type: Serosanguineous Exudate Color: red, brown Foul Odor After Cleansing: No Slough/Fibrino No % Reduction in Area: -232.4% % Reduction in Volume: -742.9% Epithelialization: None Tunneling: No Undermining: Yes Starting Position (o'clock): 1 Ending Position (o'clock): 8 Maximum Distance: (cm) 1.6 [id number] Page 8 of 8 Wound Bed Granulation Amount: Large (67-100%) Exposed Structure Granulation Quality: Red, Pale Fat Layer (Subcutaneous Tissue) Exposed: Yes Necrotic Amount: Small (1-33%) Necrotic Quality: Adherent Therapist, music) Signed: 03/28/2022 3:19:29 PM By: Gretta Cool, BSN, RN, CWS, [name] RN, BSN Previous Signature: 02/14/2022 5:09:23 PM Version By: Massie Kluver Previous Signature: 02/14/2022 5:56:59 PM Version By: Gretta Cool, BSN, RN, CWS, [name] RN, BSN Entered By: Gretta Cool, BSN, RN, CWS, [name] on 03/28/2022 15:19:29 -------------------------------------------------------------------------------- Vitals Details Patient Name: Date of Service: Daisy [name], [name]. 02/14/2022 9:00 A M Medical Record Number: [id number] Patient Account Number: [id number] Date of Birth/Sex: Treating RN: [birth year] (71 y.o. [name], [name] Primary Care Seylah Wernert: Denton Lank Other Clinician: Massie Kluver Referring Rolanda Campa: Treating Faust Thorington/Extender: Almedia Balls in Treatment: 13 Vital Signs Time Taken: 09:18 Temperature ([name]): 84 Height (in): 67 Pulse (bpm): 84 Weight (lbs): 248 Respiratory Rate (breaths/min): 18 Body Mass Index (BMI): 38.8 Blood Pressure (mmHg): 157/77 Reference Range: 80 - 120 mg / dl Electronic  Signature(s) Signed: 02/14/2022 5:09:23 PM By: Massie Kluver Entered By: Massie Kluver on 02/14/2022 09:19:31

## 2022-02-14 NOTE — Progress Notes (Addendum)
JAME, SEELIG ([id number]) Visit Report for 02/14/2022 Chief Complaint Document Details Patient Name: [name], [name] Date of Service: 02/14/2022 9:00 AM Medical Record Number: [id number] Patient Account Number: [id number] Date of Birth/Sex: [birth year] (71 y.o. F) Treating RN: Cornell Barman Primary Care Provider: Denton Lank Other Clinician: Massie Kluver Referring Provider: Denton Lank Treating Provider/Extender: Skipper Cliche in Treatment: 13 Information Obtained from: Patient Chief Complaint Right Foot Ulcer Electronic Signature(s) Signed: 02/14/2022 9:21:19 AM By: Worthy Keeler PA-C Entered By: Worthy Keeler on 02/14/2022 09:21:19 Hardwick, Mysty F. ([id number]) -------------------------------------------------------------------------------- HPI Details Patient Name: [name], [name] Date of Service: 02/14/2022 9:00 AM Medical Record Number: [id number] Patient Account Number: [id number] Date of Birth/Sex: [birth year] (71 y.o. F) Treating RN: Cornell Barman Primary Care Provider: Denton Lank Other Clinician: Massie Kluver Referring Provider: Denton Lank Treating Provider/Extender: Skipper Cliche in Treatment: 69 History of Present Illness HPI Description: this is a 71 year old morbidly obese patient who has uncontrolled diabetes and has not been taking treatment for diabetes for at least 6 months. She presents with bilateral heel ulcers of only 2 weeks duration. He was sent to [place] by her PCP Dr. Veda Canning. After this the patient was seen both by the podiatrist Dr. Caryl Comes and her vascular surgeon Dr. Delana Meyer. from what I understand from the son Dr. Delana Meyer has done some process on her and a procedure and says that he has finished working on her and no further vascular intervention is to be done. the patient was sent was for a further evaluation of her wounds. Her son has been doing some dressings at home on a twice a daily basis with Silvadene ointment. Some recent blood work  done on 07/19/2014 shows that her hemoglobin was 7.9 with a hematocrit of 25.7. Her white count was 12.7 and her glucose was 175 mg/dL. Her hemoglobin A1c was 8.4%. She has had no recent x-rays available for review. 07/31/2014. I was able to review the office visit notes from Dr. Delana Meyer on 07/14/2014. The patient's ABI on the right was 0.85 and on the left was 0.89 and both were abnormal. His assessment and plan was that the patient had severe atherosclerotic changes of both lower extremities associated with ulceration and tissue loss of the foot and this represent a limb threatening ischemia and he recommended a angiography of the lower extremities with hope for intervention for limb salvage. This was scheduled the procedure was done on 07/22/14 The surgery performed was an abdominal aortogram with the right lower extremity runoff and catheter placement. 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 08/07/2014 x-rays were done of both feet on March 10. The right heel showed soft tissue wound, with some calcaneus lesion suspicious for osteomyelitis. An MRI would be recommended. the left heel did not show any evidence of osteomyelitis. 08/14/2014 -- Reports on [name] [name] : Cardiology reports reviewed from the [facility] clinic by Dr. Nehemiah Massed 2-D echocardiography done on 08/09/2013 showed that her normal LV systolic function with ejection fraction of 55% She also had mild mitral and tricuspid insufficiency. 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 Culture report of the bone shows several  organisms which are grown and she has been put on Bactrim DS one twice a day for 14 days. The patient's bariatric surgeon Dr. Duke Salvia did speak with me today regarding the application of ACell and suggested that he would be happy to apply this in case the patient was ready for it. I had a detailed discussion with him regarding the patient's condition and I have recommended at the present time the patient would benefit from hyperbaric oxygen therapy, IV antibiotics and supportive wound care. if at some stage the wound was ready for application of a cell I would be happy to talk to him about taking over her care. He agreed with the treatment plan and said he would be in touch. Readmission: 11-15-2021 upon evaluation today patient presents for initial inspection here in our clinic concerning issues that she has been having with a wound on the plantar aspect of her right foot. I did review notes as well and it appears that she had a bone spur noted but no signs of osteomyelitis this was in May when she saw Dr. Caryl Comes. With that being said he apparently was recommended surgery to go in and remove the bone spur as he feels like that is the causative reason for the wound and indeed I feel like that may be the case as well based on what I am seeing. Fortunately I do not see any evidence of active infection locally or systemically right now although there is definitely some callus buildup and there is definitely more fluid trapped underneath that is hard to even tell which is what we are seeing currently working have to remove some of this callus in order to identify what is even going on underneath and how deep this really goes. The patient does have a history of diabetes mellitus type 2, diabetic neuropathy, and hypertension. Otherwise there are no major medical problems that would affect her wound healing. Her most recent hemoglobin A1c was on 06-16-2021 and is stated to be good at 6.7 11-29-2021 upon evaluation  today patient appears to be doing well currently in regard to her wound. She has been tolerating the dressing changes without complication. Fortunately there does not appear to be any evidence of active infection locally or systemically which is great news. No fevers, chills, nausea, vomiting, or diarrhea. I do think the Hydrofera Blue rope is doing decently well. With that being said she does have a little callus that builds up I am after cleaning this away as well as cleaning some of the base of the wound as well. She does have her MRI scheduled for later today. 12-20-2021 upon evaluation today patient appears to be doing well currently in regard to her heel ulcer. This is actually significantly improved compared to previous evaluations. I do think that we are on the right track here and we are seeing some definitive improvements. Overall I think that the patient is on the right track. I do not see any evidence of active infection systemically and locally I feel like this is also doing much better. She has been on doxycycline which I placed her on beginning 11-30-2021. This will be for 2 months and she has not yet completed the first month.Marland Kitchen 01-03-2022 upon evaluation today patient appears to be doing well currently in regard to her wound. This is actually showing signs of excellent improvement is noted completely to the surface there is just a little bit  of biofilm although we are can have to clear this away with sharp Alphin, Wanell F. ([id number]) debridement as she is saline and gauze treatment is not enough to get this loosened today. 01-03-2022 upon evaluation today patient appears to be doing about the same in regard to her wound. The good news is she still on the antibiotics and nothing seems to be getting worse but notices that she is still not really showing a lot of improvement in the undermining area as of yet. Not really what we will try to work on here is get some undermining to reattach  them to family. To that end I think that we may need to be packing this a little bit more tightly with the collagen and doing it dry. If we do this then it will start to moisten and resolve and it will not actually be over packed but hopefully should allow for more significant healing going forward. Patient voiced understanding and is in agreement with that plan. 01-17-2022 upon evaluation today patient appears to be doing about the same with regard to her wound. This is on the plantar aspect of the heel. With that being said it appears that this is not being packed as it should be. We needed to really be filling the undermining space with the collagen she had the dressing change this morning and that is definitely not what was there I think he might see some of the miscommunication in regards to the notes to home health. We will try to clarify that. 02-14-2022 upon evaluation today patient appears to be doing excellent in regard to her wound. She is actually tolerating the dressing changes without complication. Fortunately there does not appear to be any evidence of active infection locally or systemically at this time which is great news. No fevers, chills, nausea, vomiting, or diarrhea. I do believe that the collagen is doing a great job here. Electronic Signature(s) Signed: 02/14/2022 9:57:54 AM By: Worthy Keeler PA-C Entered By: Worthy Keeler on 02/14/2022 09:57:54 [name], [name] ([id number]) -------------------------------------------------------------------------------- Physical Exam Details Patient Name: Merkley, Caitlynn F. Date of Service: 02/14/2022 9:00 AM Medical Record Number: [id number] Patient Account Number: [id number] Date of Birth/Sex: 29-Apr-1951 (71 y.o. F) Treating RN: Cornell Barman Primary Care Provider: Denton Lank Other Clinician: Massie Kluver Referring Provider: Denton Lank Treating Provider/Extender: Jeri Cos Weeks in Treatment: 17 Constitutional Well-nourished and  well-hydrated in no acute distress. Respiratory normal breathing without difficulty. Psychiatric this patient is able to make decisions and demonstrates good insight into disease process. Alert and Oriented x 3. pleasant and cooperative. Notes Upon inspection patient's wound bed actually showed signs of good granulation and epithelization at this point. Fortunately I see no evidence of active infection locally or systemically which is great news. No fevers, chills, nausea, vomiting, or diarrhea. Electronic Signature(s) Signed: 02/14/2022 9:58:10 AM By: Worthy Keeler PA-C Entered By: Worthy Keeler on 02/14/2022 09:58:10 [name], [name] ([id number]) -------------------------------------------------------------------------------- Physician Orders Details Patient Name: [name], [name] Date of Service: 02/14/2022 9:00 AM Medical Record Number: [id number] Patient Account Number: [id number] Date of Birth/Sex: [birth year] (71 y.o. 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 o Nurse Visit as needed [place] for wound care. May utilize formulary equivalent dressing for wound treatment orders unless otherwise specified. Home Health Nurse may visit PR[facility] to address patientos wound care needs. o Scheduled days for dressing changes to be completed; exception, patient has scheduled wound care visit that day. o  **Please direct any NON-WOUND related issues/requests for orders to patient's Primary Care Physician. **If current dressing causes regression in wound condition, may D/C ordered dressing product/s and apply Normal Saline Moist Dressing daily until next [place] or Other MD appointment. **Notify [facility] of regression in wound condition at [phone]. o Other Home Health Orders/Instructions: - Pack silver collagen lightly into wound undermining 12-12 2.5cm. PLEASE pack in prisma (silver collagen) DRY and DO NOT moisten as patient is having increased drainage Bathing/ Shower/ Hygiene o Wash wounds with antibacterial soap and water. o May shower; gently cleanse wound with antibacterial soap, rinse and pat dry prior to dressing wounds o No tub bath. Off-Loading o Other: - continue to wear crow walker boot to relieve pressure off wound Wound Treatment Wound #5 - Foot Wound Laterality: Plantar, Right Cleanser: [place] Ancillary Kit - 15 Day Supply 3 x Per Week/30 Days Discharge Instructions: Use supplies as instructed; Kit contains: (15) Saline Bullets; (15) 3x3 Gauze; 15 pr Gloves Cleanser: Soap and Water 3 x Per Week/30 Days Discharge Instructions: Gently cleanse wound with antibacterial soap, rinse and pat dry prior to dressing wounds Primary Dressing: Prisma 4.34 (in) (Home Health) 3 x Per Week/30 Days Discharge Instructions: PLEASE PACK DRY INTO WOUND (Undermining 12 oclock to 12 oclock , deepest point is at heel Secondary Dressing: (BORDER) Zetuvit Plus SILICONE BORDER Dressing 4x4 (in/in) (Home Health) 3 x Per Week/30 Days Discharge Instructions: Please do not put silicone bordered dressings under wraps. Use non-bordered dressing only. Electronic Signature(s) Signed: 02/14/2022 12:16:04 PM By: Worthy Keeler PA-C Signed: 02/14/2022 5:09:23 PM By: Massie Kluver Entered By: Massie Kluver on 02/14/2022 09:40:52 [name], [name]  ([id number]) -------------------------------------------------------------------------------- Problem List Details Patient Name: Kamara, Starlet F. Date of Service: 02/14/2022 9:00 AM Medical Record Number: [id number] Patient Account Number: [id number] Date of Birth/Sex: Jul 09, 1950 (71 y.o. F) Treating RN: Cornell Barman Primary Care Provider: Denton Lank Other Clinician: Massie Kluver Referring Provider: Denton Lank Treating Provider/Extender: Jeri Cos Weeks in Treatment: 13 Active Problems ICD-10 Encounter Code Description Active Date MDM Diagnosis E11.621 Type 2 diabetes mellitus with foot ulcer 11/15/2021 No Yes L97.512 Non-pressure chronic ulcer of other part of right foot with fat layer 11/15/2021 No Yes exposed E11.43 Type 2 diabetes mellitus with diabetic autonomic (poly)neuropathy 11/15/2021 No Yes I10 Essential (primary) hypertension 11/15/2021 No Yes Inactive Problems Resolved Problems Electronic Signature(s) Signed: 02/14/2022 9:21:15 AM By: Worthy Keeler PA-C Entered By: Worthy Keeler on 02/14/2022 09:21:15 [name], [name] ([id number]) -------------------------------------------------------------------------------- Progress Note Details Patient Name: [name], [name] Date of Service: 02/14/2022 9:00 AM Medical Record Number: [id number] Patient Account Number: [id number] Date of Birth/Sex: 15-Aug-1950 (71 y.o. F) Treating RN: Cornell Barman Primary Care Provider: Denton Lank Other Clinician: Massie Kluver Referring Provider: Denton Lank Treating Provider/Extender: Skipper Cliche in Treatment: 13 Subjective Chief Complaint Information obtained from Patient Right Foot Ulcer History of Present Illness (HPI) this is a 71 year old morbidly obese patient who has uncontrolled diabetes and has not been taking treatment for diabetes for at least 6  months. She presents with bilateral heel ulcers of only 2 weeks duration. He was sent to [place] by her PCP Dr. Veda Canning. After this  the patient was seen both by the podiatrist Dr. Caryl Comes and her vascular surgeon Dr. Delana Meyer. from what I understand from the son Dr. Delana Meyer has done some process on her and a procedure and says that he has finished working on her and no further vascular intervention is to be done. the patient was sent was for a further evaluation of her wounds. Her son has been doing some dressings at home on a twice a daily basis with Silvadene ointment. Some recent blood work done on 07/19/2014 shows that her hemoglobin was 7.9 with a hematocrit of 25.7. Her white count was 12.7 and her glucose was 175 mg/dL. Her hemoglobin A1c was 8.4%. She has had no recent x-rays available for review. 07/31/2014. I was able to review the office visit notes from Dr. Delana Meyer on 07/14/2014. The patient's ABI on the right was 0.85 and on the left was 0.89 and both were abnormal. His assessment and plan was that the patient had severe atherosclerotic changes of both lower extremities associated with ulceration and tissue loss of the foot and this represent a limb threatening ischemia and he recommended a angiography of the lower extremities with hope for intervention for limb salvage. This was scheduled the procedure was done on 07/22/14 The surgery performed was an abdominal aortogram with the right lower extremity runoff and catheter placement. 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 08/07/2014 x-rays were done of both feet on March 10. The right heel showed soft tissue wound, with some calcaneus lesion suspicious for osteomyelitis. An MRI would be recommended. the left heel did not show any evidence of osteomyelitis. 08/14/2014 -- Reports  on Myonna Zepeda : Cardiology reports reviewed from the [facility] clinic by Dr. Nehemiah Massed 2-D echocardiography done on 08/09/2013 showed that her normal LV systolic function with ejection fraction of 55% She also had mild mitral and tricuspid insufficiency. 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 Culture report of the bone shows several organisms which are grown and she has been put on Bactrim DS one twice a day for 14 days. The patient's bariatric surgeon Dr. Duke Salvia did speak with me today regarding the application of ACell and suggested that he would be happy to apply this in case the patient was ready for it. I had a detailed discussion with him regarding the patient's condition and I have recommended at the present time the patient would benefit from hyperbaric oxygen therapy, IV antibiotics and supportive wound care. if at some stage the wound was ready for application of a cell I would be happy to talk to him about taking over her care. He agreed with the treatment plan and said he would be in touch. Readmission: 11-15-2021 upon evaluation today patient presents for initial inspection here in our clinic concerning issues that she has been having with a wound on the plantar aspect of her right foot. I did review notes as well and it appears  that she had a bone spur noted but no signs of osteomyelitis this was in May when she saw Dr. Caryl Comes. With that being said he apparently was recommended surgery to go in and remove the bone spur as he feels like that is the causative reason for the wound and indeed I feel like that may be the case as well based on what I am seeing. Fortunately I do not see any evidence of active infection locally or systemically right now although there is definitely some callus buildup and there is definitely more fluid trapped underneath that is hard to even  tell which is what we are seeing currently working have to remove some of this callus in order to identify what is even going on underneath and how deep this really goes. The patient does have a history of diabetes mellitus type 2, diabetic neuropathy, and hypertension. Otherwise there are no major medical problems that would affect her wound healing. Her most recent hemoglobin A1c was on 06-16-2021 and is stated to be good at 6.7 11-29-2021 upon evaluation today patient appears to be doing well currently in regard to her wound. She has been tolerating the dressing changes without complication. Fortunately there does not appear to be any evidence of active infection locally or systemically which is great news. No fevers, chills, nausea, vomiting, or diarrhea. I do think the Hydrofera Blue rope is doing decently well. With that being said she does have a little callus that builds up I am after cleaning this away as well as cleaning some of the base of the wound as well. She does have her MRI scheduled for later today. 12-20-2021 upon evaluation today patient appears to be doing well currently in regard to her heel ulcer. This is actually significantly improved compared to previous evaluations. I do think that we are on the right track here and we are seeing some definitive improvements. Overall I think that the patient is on the right track. I do not see any evidence of active infection systemically and locally I feel like this is also doing much better. She has been on doxycycline which I placed her on beginning 11-30-2021. This will be for 2 months and she has not yet completed the first [name], [name] ([id number]) month.Marland Kitchen 01-03-2022 upon evaluation today patient appears to be doing well currently in regard to her wound. This is actually showing signs of excellent improvement is noted completely to the surface there is just a little bit of biofilm although we are can have to clear this away with  sharp debridement as she is saline and gauze treatment is not enough to get this loosened today. 01-03-2022 upon evaluation today patient appears to be doing about the same in regard to her wound. The good news is she still on the antibiotics and nothing seems to be getting worse but notices that she is still not really showing a lot of improvement in the undermining area as of yet. Not really what we will try to work on here is get some undermining to reattach them to family. To that end I think that we may need to be packing this a little bit more tightly with the collagen and doing it dry. If we do this then it will start to moisten and resolve and it will not actually be over packed but hopefully should allow for more significant healing going forward. Patient voiced understanding and is in agreement with that plan. 01-17-2022 upon evaluation today patient appears to  be doing about the same with regard to her wound. This is on the plantar aspect of the heel. With that being said it appears that this is not being packed as it should be. We needed to really be filling the undermining space with the collagen she had the dressing change this morning and that is definitely not what was there I think he might see some of the miscommunication in regards to the notes to home health. We will try to clarify that. 02-14-2022 upon evaluation today patient appears to be doing excellent in regard to her wound. She is actually tolerating the dressing changes without complication. Fortunately there does not appear to be any evidence of active infection locally or systemically at this time which is great news. No fevers, chills, nausea, vomiting, or diarrhea. I do believe that the collagen is doing a great job here. Objective Constitutional Well-nourished and well-hydrated in no acute distress. Vitals Time Taken: 9:18 AM, Height: 67 in, Weight: 248 lbs, BMI: 38.8, Temperature: 84 F, Pulse: 84 bpm, Respiratory  Rate: 18 breaths/min, Blood Pressure: 157/77 mmHg. Respiratory normal breathing without difficulty. Psychiatric this patient is able to make decisions and demonstrates good insight into disease process. Alert and Oriented x 3. pleasant and cooperative. General Notes: Upon inspection patient's wound bed actually showed signs of good granulation and epithelization at this point. Fortunately I see no evidence of active infection locally or systemically which is great news. No fevers, chills, nausea, vomiting, or diarrhea. Integumentary (Hair, Skin) Wound #5 status is Open. Original cause of wound was Pressure Injury. The date acquired was: 06/23/2021. The wound has been in treatment 13 weeks. The wound is located on the [place]. The wound measures 0.5cm length x 0.6cm width x 0.5cm depth; 0.236cm^2 area and 0.118cm^3 volume. There is Fat Layer (Subcutaneous Tissue) exposed. There is no tunneling noted, however, there is undermining starting at 1:00 and ending at 8:00 with a maximum distance of 1.6cm. There is a medium amount of serosanguineous drainage noted. The wound margin is well defined and not attached to the wound base. There is large (67-100%) red, pale granulation within the wound bed. There is a small (1-33%) amount of necrotic tissue within the wound bed including Adherent Slough. 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 ([id number]) Follow-up Appointments: Return Appointment in 2 weeks. Nurse Visit as needed Home Health: [facility]: - [facility] for wound care. May utilize formulary equivalent dressing for wound treatment orders unless otherwise specified. Home Health Nurse may visit PRN to address patient s wound  care needs. Scheduled days for dressing changes to be completed; exception, patient has scheduled wound care visit that day. **Please direct any NON-WOUND related issues/requests for orders to patient's Primary Care Physician. **If current dressing causes regression in wound condition, may D/C ordered dressing product/s and apply Normal Saline Moist Dressing daily until next [place] or Other MD appointment. **Notify [facility] of regression in wound condition at [phone]. Other Home Health Orders/Instructions: - Pack silver collagen lightly into wound undermining 12-12 2.5cm. PLEASE pack in prisma (silver collagen) DRY and DO NOT moisten as patient is having increased drainage Bathing/ Shower/ Hygiene: Wash wounds with antibacterial soap and water. May shower; gently cleanse wound with antibacterial soap, rinse and pat dry prior to  dressing wounds No tub bath. Off-Loading: Other: - continue to wear crow walker boot to relieve pressure off wound WOUND #5: - Foot Wound Laterality: Plantar, Right Cleanser: [place] Ancillary Kit - 15 Day Supply 3 x Per Week/30 Days Discharge Instructions: Use supplies as instructed; Kit contains: (15) Saline Bullets; (15) 3x3 Gauze; 15 pr Gloves Cleanser: Soap and Water 3 x Per Week/30 Days Discharge Instructions: Gently cleanse wound with antibacterial soap, rinse and pat dry prior to dressing wounds Primary Dressing: Prisma 4.34 (in) (Home Health) 3 x Per Week/30 Days Discharge Instructions: PLEASE PACK DRY INTO WOUND (Undermining 12 oclock to 12 oclock , deepest point is at heel Secondary Dressing: (BORDER) Zetuvit Plus SILICONE BORDER Dressing 4x4 (in/in) (Home Health) 3 x Per Week/30 Days Discharge Instructions: Please do not put silicone bordered dressings under wraps. Use non-bordered dressing only. 1. I am good recommend that we go ahead and continue with the recommendations for wound care measures as before the patient is  in agreement with the plan. This includes the use of the silver collagen packed into the wound bed which I think is doing an awesome job here. 2. I am also can recommend that we have the patient continue to monitor for any signs of worsening or infection if anything changes she should contact the office and let me know. Overall though I think we are doing quite well. We will see patient back for reevaluation in 2 weeks here in the clinic. If anything worsens or changes patient will contact our office for additional recommendations. Electronic Signature(s) Signed: 02/14/2022 9:58:53 AM By: Worthy Keeler PA-C Entered By: Worthy Keeler on 02/14/2022 09:58:53 [name], [name] ([id number]) -------------------------------------------------------------------------------- SuperBill Details Patient Name: [name], Koriana F. Date of Service: 02/14/2022 Medical Record Number: [id number] Patient Account Number: [id number] Date of Birth/Sex: January 15, 1951 (71 y.o. F) Treating RN: Cornell Barman Primary Care Provider: Denton Lank Other Clinician: Massie Kluver Referring Provider: Denton Lank Treating Provider/Extender: Jeri Cos Weeks in Treatment: 13 Diagnosis Coding ICD-10 Codes Code Description E11.621 Type 2 diabetes mellitus with foot ulcer L97.512 Non-pressure chronic ulcer of other part of right foot with fat layer exposed E11.43 Type 2 diabetes mellitus with diabetic autonomic (poly)neuropathy I10 Essential (primary) hypertension Facility Procedures CPT4 Code: 66294765 Description: 99213 - WOUND CARE VISIT-LEV 3 EST PT Modifier: Quantity: 1 Physician Procedures CPT4 Code: 4650354 Description: 99213 - WC PHYS LEVEL 3 - EST PT Modifier: Quantity: 1 CPT4 Code: Description: ICD-10 Diagnosis Description E11.621 Type 2 diabetes mellitus with foot ulcer L97.512 Non-pressure chronic ulcer of other part of right foot with fat layer e E11.43 Type 2 diabetes mellitus with diabetic autonomic  (poly)neuropathy I10  Essential (primary) hypertension Modifier: xposed Quantity: Electronic Signature(s) Signed: 02/14/2022 9:59:05 AM By: Worthy Keeler PA-C Entered By: Worthy Keeler on 02/14/2022 09:59:05

## 2022-02-28 ENCOUNTER — Encounter: Payer: 59 | Attending: Physician Assistant | Admitting: Physician Assistant

## 2022-02-28 DIAGNOSIS — E1143 Type 2 diabetes mellitus with diabetic autonomic (poly)neuropathy: Secondary | ICD-10-CM | POA: Diagnosis not present

## 2022-02-28 DIAGNOSIS — E11621 Type 2 diabetes mellitus with foot ulcer: Secondary | ICD-10-CM | POA: Insufficient documentation

## 2022-02-28 DIAGNOSIS — Z6838 Body mass index (BMI) 38.0-38.9, adult: Secondary | ICD-10-CM | POA: Insufficient documentation

## 2022-02-28 DIAGNOSIS — L97512 Non-pressure chronic ulcer of other part of right foot with fat layer exposed: Secondary | ICD-10-CM | POA: Diagnosis not present

## 2022-02-28 NOTE — Progress Notes (Addendum)
**Note Daisy-Identified via Obfuscation** [name], [name] ([id number]) [id number] Page 1 of 8 Visit Report for 02/28/2022 Arrival Information Details Patient Name: Date of Service: [name]. 02/28/2022 10:30 A M Medical Record Number: [id number] Patient Account Number: [id number] Date of Birth/Sex: Treating RN: 10-24-1950 (71 y.o. [name], [name] Primary Care [name]: [name] Other Clinician: Massie [name] Referring [name]: Treating [name]/Extender: [name] in Treatment: 15 Visit Information History Since Last Visit All ordered tests and consults were completed: No Patient Arrived: Wheel Chair Added or deleted any medications: Yes Arrival Time: 10:55 Any new allergies or adverse reactions: No Transfer Assistance: EasyPivot Patient Lift Had a fall or experienced change in No Patient Requires Transmission-Based No activities of daily living that may affect Precautions: risk of falls: Patient Has Alerts: Yes Signs or symptoms of abuse/neglect since last visito No Patient Alerts: Patient on Blood Thinner Hospitalized since last visit: No ABI 11/15/21 L 1.17 R 1.27 Implantable device outside of the clinic excluding No cellular tissue based products placed in the center since last visit: Pain Present Now: No Electronic Signature(s) Signed: 02/28/2022 4:48:52 PM By: [name] Entered By: [name] on 02/28/2022 11:00:57 -------------------------------------------------------------------------------- Clinic Level of Care Assessment Details Patient Name: Date of Service: Daisy [name], [name]. 02/28/2022 10:30 A M Medical Record Number: [id number] Patient Account Number: [id number] Date of Birth/Sex: Treating RN: [birth year] (71 y.o. [name] Primary Care [name]: [name] Other Clinician: Massie [name] Referring [name]: Treating [name]: [name] in Treatment: 15 Clinic Level of Care Assessment Items TOOL 1  Quantity Score '[]'$  - 0 Use when EandM and Procedure is performed on INITIAL visit ASSESSMENTS - Nursing Assessment / Reassessment '[]'$  - 0 General Physical Exam (combine w/ comprehensive assessment (listed just below) when performed on new pt. evals) '[]'$  - 0 Comprehensive Assessment (HX, ROS, Risk Assessments, Wounds Hx, etc.) [name], [name] ([id number]) [id number] Page 2 of 8 ASSESSMENTS - Wound and Skin Assessment / Reassessment '[]'$  - 0 Dermatologic / Skin Assessment (not related to wound area) ASSESSMENTS - Ostomy and/or Continence Assessment and Care '[]'$  - 0 Incontinence Assessment and Management '[]'$  - 0 Ostomy Care Assessment and Management (repouching, etc.) PROCESS - Coordination of Care '[]'$  - 0 Simple Patient / Family Education for ongoing care '[]'$  - 0 Complex (extensive) Patient / Family Education for ongoing care '[]'$  - 0 Staff obtains Programmer, systems, Records, T Results / Process Orders est '[]'$  - 0 Staff telephones HHA, Nursing Homes / Clarify orders / etc '[]'$  - 0 Routine Transfer to another Facility (non-emergent condition) '[]'$  - 0 Routine Hospital Admission (non-emergent condition) '[]'$  - 0 New Admissions / Biomedical engineer / Ordering NPWT Apligraf, etc. , '[]'$  - 0 Emergency Hospital Admission (emergent condition) PROCESS - Special Needs '[]'$  - 0 Pediatric / Minor Patient Management '[]'$  - 0 Isolation Patient Management '[]'$  - 0 Hearing / Language / Visual special needs '[]'$  - 0 Assessment of Community assistance (transportation, D/C planning, etc.) '[]'$  - 0 Additional assistance / Altered mentation '[]'$  - 0 Support Surface(s) Assessment (bed, cushion, seat, etc.) INTERVENTIONS - Miscellaneous '[]'$  - 0 External ear exam '[]'$  - 0 Patient Transfer (multiple staff / Civil Service fast streamer / Similar devices) '[]'$  - 0 Simple Staple / Suture removal (25 or less) '[]'$  - 0 Complex Staple / Suture removal (26 or more) '[]'$  - 0 Hypo/Hyperglycemic Management (do not check if  billed separately) '[]'$  - 0 Ankle / Brachial Index (ABI) - do not check if billed separately Has the patient  been seen at the hospital within the last three years: Yes Total Score: 0 Level Of Care: ____ Electronic Signature(s) Signed: 02/28/2022 4:48:52 PM By: [name] Entered By: [name] on 02/28/2022 11:26:27 -------------------------------------------------------------------------------- Encounter Discharge Information Details Patient Name: Date of Service: [name], [name]. 02/28/2022 10:30 A M Medical Record Number: [id number] Patient Account Number: [id number] Date of Birth/Sex: Treating RN: Dec 14, 1950 (71 y.o. [name] Primary Care Baylynn Shifflett: [name] Other Clinician: Massie [name] Referring Shafer Swamy: Treating [name]/Extender: [name] in Treatment: 23 [name] [id number] Page 3 of 8 Encounter Discharge Information Items Post Procedure Vitals Discharge Condition: Stable Temperature ([name]): 98.0 Ambulatory Status: Wheelchair Pulse (bpm): 91 Discharge Destination: [place] Respiratory Rate (breaths/min): 18 Telephoned: Yes Blood Pressure (mmHg): 145/80 Orders Sent: Yes Transportation: Other Accompanied By: self Schedule Follow-up Appointment: Yes Clinical Summary of Care: Electronic Signature(s) Signed: 02/28/2022 4:48:52 PM By: [name] Entered By: [name] on 02/28/2022 11:35:20 -------------------------------------------------------------------------------- Lower Extremity Assessment Details Patient Name: Date of Service: [name], [name]. 02/28/2022 10:30 A M Medical Record Number: [id number] Patient Account Number: [id number] Date of Birth/Sex: Treating RN: 16-Jan-1951 (71 y.o. [name] Primary Care Quavis Klutz: [name] Other Clinician: Massie [name] Referring Desta Bujak: Treating Shawnay Bramel/Extender: [name] in Treatment:  15 Electronic Signature(s) Signed: 02/28/2022 4:48:52 PM By: [name] Signed: 03/04/2022 12:25:01 PM By: Carlene Coria RN Entered By: [name] on 02/28/2022 11:12:05 -------------------------------------------------------------------------------- Multi Wound Chart Details Patient Name: Date of Service: [name], [name]. 02/28/2022 10:30 A M Medical Record Number: [id number] Patient Account Number: [id number] Date of Birth/Sex: Treating RN: 01/28/1951 (71 y.o. [name] Primary Care Kellis Mcadam: [name] Other Clinician: Massie [name] Referring Naava Janeway: Treating Kelce Bouton/Extender: [name] in Treatment: 15 Vital Signs Height(in): 67 Pulse(bpm): 91 Weight(lbs): 248 Blood Pressure(mmHg): 145/80 Body Mass Index(BMI): 38.8 Temperature([name]): 98.0 Respiratory Rate(breaths/min): 18 Rihn, Rhylen [name] ([id number]) [id number] Page 4 of 8 [5:Photos:] [N/A:N/A] Right, Plantar Foot N/A N/A Wound Location: Pressure Injury N/A N/A Wounding Event: Diabetic Wound/Ulcer of the Lower N/A N/A Primary Etiology: Extremity Cataracts, Anemia, Hypertension, N/A N/A Comorbid History: Type II Diabetes, Osteoarthritis, Osteomyelitis, Neuropathy, Confinement Anxiety 06/23/2021 N/A N/A Date Acquired: 15 N/A N/A Weeks of Treatment: Open N/A N/A Wound Status: No N/A N/A Wound Recurrence: 0.5x0.7x0.6 N/A N/A Measurements L x W x D (cm) 0.275 N/A N/A A (cm) : rea 0.165 N/A N/A Volume (cm) : -287.30% N/A N/A % Reduction in A rea: -1078.60% N/A N/A % Reduction in Volume: 12 Starting Position 1 (o'clock): 12 Ending Position 1 (o'clock): 1.5 Maximum Distance 1 (cm): Yes N/A N/A Undermining: Grade 2 N/A N/A Classification: Medium N/A N/A Exudate A mount: Serosanguineous N/A N/A Exudate Type: red, brown N/A N/A Exudate Color: Well defined, not attached N/A N/A Wound Margin: Large (67-100%) N/A N/A Granulation A  mount: Red, Pale N/A N/A Granulation Quality: Small (1-33%) N/A N/A Necrotic A mount: Fat Layer (Subcutaneous Tissue): Yes N/A N/A Exposed Structures: None N/A N/A Epithelialization: Treatment Notes Electronic Signature(s) Signed: 02/28/2022 4:48:52 PM By: [name] Entered By: [name] on 02/28/2022 11:12:49 -------------------------------------------------------------------------------- Multi-Disciplinary Care Plan Details Patient Name: Date of Service: [name], [name]. 02/28/2022 10:30 A M Medical Record Number: [id number] Patient Account Number: [id number] Date of Birth/Sex: Treating RN: Jun 22, 1950 (71 y.o. [name] Primary Care Moorea Boissonneault: [name] Other Clinician: Massie [name] Referring Darcey Cardy: Treating Samy Ryner/Extender: [name] in Treatment: 15 Active Inactive Wound/Skin Impairment [place],  Karrissa [name] ([id number]) [id number] Page 5 of 8 Nursing Diagnoses: Impaired tissue integrity Knowledge deficit related to ulceration/compromised skin integrity Goals: Ulcer/skin breakdown will have a volume reduction of 30% by week 4 Date Initiated: 11/15/2021 Date Inactivated: 01/17/2022 Target Resolution Date: 12/13/2021 Goal Status: Unmet Unmet Reason: comorbities Ulcer/skin breakdown will have a volume reduction of 50% by week 8 Date Initiated: 11/15/2021 Date Inactivated: 01/17/2022 Target Resolution Date: 01/10/2022 Goal Status: Unmet Unmet Reason: comorbities Ulcer/skin breakdown will have a volume reduction of 80% by week 12 Date Initiated: 11/15/2021 Target Resolution Date: 02/07/2022 Goal Status: Active Ulcer/skin breakdown will heal within 14 weeks Date Initiated: 11/15/2021 Target Resolution Date: 02/21/2022 Goal Status: Active Interventions: Assess patient/caregiver ability to obtain necessary supplies Assess patient/caregiver ability to perform ulcer/skin care regimen upon admission and as  needed Assess ulceration(s) every visit Provide education on ulcer and skin care Notes: Electronic Signature(s) Signed: 02/28/2022 4:48:52 PM By: [name] Signed: 03/04/2022 12:25:01 PM By: Carlene Coria RN Entered By: [name] on 02/28/2022 11:12:34 -------------------------------------------------------------------------------- Pain Assessment Details Patient Name: Date of Service: [name], Delayni [name]. 02/28/2022 10:30 A M Medical Record Number: [id number] Patient Account Number: [id number] Date of Birth/Sex: Treating RN: March 17, 1951 (71 y.o. [name] Primary Care Anet [place]: [name] Other Clinician: Massie [name] Referring Loran Auguste: Treating Shyrl Obi/Extender: [name] in Treatment: 15 Active Problems Location of Pain Severity and Description of Pain Patient Has Paino No Site Locations [name], [name] ([id number]) [id number] Page 6 of 8 Pain Management and Medication Current Pain Management: Electronic Signature(s) Signed: 02/28/2022 4:48:52 PM By: [name] Signed: 03/04/2022 12:25:01 PM By: Carlene Coria RN Entered By: [name] on 02/28/2022 11:05:08 -------------------------------------------------------------------------------- Patient/Caregiver Education Details Patient Name: Date of Service: [name], 22 [name]. 10/9/2023andnbsp10:30 A M Medical Record Number: [id number] Patient Account Number: [id number] Date of Birth/Gender: Treating RN: 05-12-1951 (71 y.o. [name] Primary Care Physician: [name] Other Clinician: Massie [name] Referring Physician: Treating Physician/Extender: [name] in Treatment: 15 Education Assessment Education Provided To: Patient Education Topics Provided Wound/Skin Impairment: Handouts: Other: continue wound care as directed Methods: Explain/Verbal Responses: State content correctly Electronic Signature(s) Signed: 02/28/2022  4:48:52 PM By: [name] Entered By: [name] on 02/28/2022 11:26:50 [name], [name] ([id number]) [id number] Page 7 of 8 -------------------------------------------------------------------------------- Wound Assessment Details Patient Name: Date of Service: [name]. 02/28/2022 10:30 A M Medical Record Number: [id number] Patient Account Number: [id number] Date of Birth/Sex: Treating RN: December 09, 1950 (71 y.o. [name] Primary Care Arael Piccione: [name] Other Clinician: Massie [name] Referring Martinique Pizzimenti: Treating Davieon Stockham/Extender: [name] in Treatment: 15 Wound Status Wound Number: 5 Primary Diabetic Wound/Ulcer of the Lower Extremity Etiology: Wound Location: Right, Plantar Foot Wound Open Wounding Event: Pressure Injury Status: Date Acquired: 06/23/2021 Comorbid Cataracts, Anemia, Hypertension, Type II Diabetes, Osteoarthritis, Weeks Of Treatment: 15 History: Osteomyelitis, Neuropathy, Confinement Anxiety Clustered Wound: No Photos Wound Measurements Length: (cm) 0.5 Width: (cm) 0.7 Depth: (cm) 0.6 Area: (cm) 0.275 Volume: (cm) 0.165 % Reduction in Area: -287.3% % Reduction in Volume: -1078.6% Epithelialization: None Tunneling: No Undermining: Yes Starting Position (o'clock): 12 Ending Position (o'clock): 12 Maximum Distance: (cm) 1.5 Wound Description Classification: Grade 3 Wagner Verification: MRI Wound Margin: Well defined, not attached Exudate Amount: Medium Exudate Type: Serosanguineous Exudate Color: red, brown Foul Odor After Cleansing: No Slough/Fibrino No Wound Bed Granulation Amount: Large (67-100%) Exposed Structure Granulation Quality: Red, Pale Fat Layer (Subcutaneous Tissue) Exposed: Yes Necrotic Amount: Small (1-33%) Necrotic Quality: Adherent  Slough Assessment Notes Late entry to correct staging. Electronic Signature(s) Signed: 03/28/2022 3:20:26 PM By: Gretta Cool, BSN, RN,  CWS, [name] RN, BSN Signed: 04/21/2022 4:42:13 PM By: Carlene Coria RN [name], [name]11/30/2023 4:42:13 PM By: Carlene Coria RN Signed: ([id number]) [id number] Page 8 of 8 Previous Signature: 02/28/2022 4:48:52 PM Version By: [name] Previous Signature: 03/04/2022 12:25:01 PM Version By: Carlene Coria RN Entered By: Gretta Cool, BSN, RN, CWS, [name] on 03/28/2022 15:20:26 -------------------------------------------------------------------------------- Vitals Details Patient Name: Date of Service: [name], [name]. 02/28/2022 10:30 A M Medical Record Number: [id number] Patient Account Number: [id number] Date of Birth/Sex: Treating RN: June 25, 1950 (71 y.o. [name] Primary Care Asees Manfredi: [name] Other Clinician: Massie [name] Referring Marylon Verno: Treating Flonnie Wierman/Extender: [name] in Treatment: 15 Vital Signs Time Taken: 11:04 Temperature ([name]): 98.0 Height (in): 67 Pulse (bpm): 91 Weight (lbs): 248 Respiratory Rate (breaths/min): 18 Body Mass Index (BMI): 38.8 Blood Pressure (mmHg): 145/80 Reference Range: 80 - 120 mg / dl Electronic Signature(s) Signed: 02/28/2022 4:48:52 PM By: [name] Entered By: [name] on 02/28/2022 11:05:03

## 2022-02-28 NOTE — Progress Notes (Addendum)
SELENI, MELLER ([id number]) Visit Report for 02/28/2022 Chief Complaint Document Details Patient Name: [name], [name] Date of Service: 02/28/2022 10:30 AM Medical Record Number: [id number] Patient Account Number: [id number] Date of Birth/Sex: 1950/10/19 (71 y.o. F) Treating RN: Carlene Coria Primary Care Provider: Denton Lank Other Clinician: Massie Kluver Referring Provider: Denton Lank Treating Provider/Extender: Skipper Cliche in Treatment: 15 Information Obtained from: Patient Chief Complaint Right Foot Ulcer Electronic Signature(s) Signed: 02/28/2022 10:37:43 AM By: Worthy Keeler PA-C Entered By: Worthy Keeler on 02/28/2022 10:37:42 Lotito, Mirel F. ([id number]) -------------------------------------------------------------------------------- Debridement Details Patient Name: [name], [name] Date of Service: 02/28/2022 10:30 AM Medical Record Number: [id number] Patient Account Number: [id number] Date of Birth/Sex: 1951-02-18 (71 y.o. F) Treating RN: Carlene Coria Primary Care Provider: Denton Lank Other Clinician: Massie Kluver Referring Provider: Denton Lank Treating Provider/Extender: Skipper Cliche in Treatment: 15 Debridement Performed for Wound #5 Right,Plantar Foot Assessment: Performed By: Physician Tommie Sams., PA-C Debridement Type: Debridement Severity of Tissue Pre Debridement: Fat layer exposed Level of Consciousness (Pre- Awake and Alert procedure): Pre-procedure Verification/Time Out Yes - 11:22 Taken: Start Time: 11:22 Total Area Debrided (L x W): 1 (cm) x 1 (cm) = 1 (cm) Tissue and other material Viable, Non-Viable, Callus debrided: Level: Non-Viable Tissue Debridement Description: Selective/Open Wound Instrument: Curette Bleeding: Minimum Hemostasis Achieved: Pressure End Time: 11:25 Response to Treatment: Procedure was tolerated well Level of Consciousness (Post- Awake and Alert procedure): Post Debridement Measurements of  Total Wound Length: (cm) 0.5 Width: (cm) 0.7 Depth: (cm) 0.6 Volume: (cm) 0.165 Character of Wound/Ulcer Post Debridement: Stable Severity of Tissue Post Debridement: Fat layer exposed Post Procedure Diagnosis Same as Pre-procedure Electronic Signature(s) Signed: 02/28/2022 4:48:52 PM By: Massie Kluver Signed: 03/01/2022 6:10:21 PM By: Worthy Keeler PA-C Signed: 03/04/2022 12:25:01 PM By: Carlene Coria RN Entered By: Massie Kluver on 02/28/2022 11:25:43 Barcus, Cylee F. ([id number]) -------------------------------------------------------------------------------- HPI Details Patient Name: [name], [name] Date of Service: 02/28/2022 10:30 AM Medical Record Number: [id number] Patient Account Number: [id number] Date of Birth/Sex: [birth year] (71 y.o. F) Treating RN: Carlene Coria Primary Care Provider: Denton Lank Other Clinician: Massie Kluver Referring Provider: Denton Lank Treating Provider/Extender: Skipper Cliche in Treatment: 15 History of Present Illness HPI Description: this is a 71 year old morbidly obese patient who has uncontrolled diabetes and has not been taking treatment for diabetes for at least 6 months. She presents with bilateral heel ulcers of only 2 weeks duration. He was sent to [place] by her PCP Dr. Veda Canning. After this the patient was seen both by the podiatrist Dr. Caryl Comes and her vascular surgeon Dr. Delana Meyer. from what I understand from the son Dr. Delana Meyer has done some process on her and a procedure and says that he has finished working on her and no further vascular intervention is to be done. the patient was sent was for a further evaluation of her wounds. Her son has been doing some dressings at home on a twice a daily basis with Silvadene ointment. Some recent blood work done on 07/19/2014 shows that her hemoglobin was 7.9 with a hematocrit of 25.7. Her white count was 12.7 and her glucose was 175 mg/dL. Her hemoglobin A1c was 8.4%. She has had no recent  x-rays available for review. 07/31/2014. I was able to review the office visit notes from Dr. Delana Meyer on 07/14/2014. The patient's ABI on the right was 0.85 and on the left was 0.89 and both were abnormal. His assessment and plan was that the patient had severe  atherosclerotic changes of both lower extremities associated with ulceration and tissue loss of the foot and this represent a limb threatening ischemia and he recommended a angiography of the lower extremities with hope for intervention for limb salvage. This was scheduled the procedure was done on 07/22/14 The surgery performed was an abdominal aortogram with the right lower extremity runoff and catheter placement. 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 08/07/2014 x-rays were done of both feet on March 10. The right heel showed soft tissue wound, with some calcaneus lesion suspicious for osteomyelitis. An MRI would be recommended. the left heel did not show any evidence of osteomyelitis. 08/14/2014 -- Reports on Simona Neuman : Cardiology reports reviewed from the [facility] clinic by Dr. Nehemiah Massed 2-D echocardiography done on 08/09/2013 showed that her normal LV systolic function with ejection fraction of 55% She also had mild mitral and tricuspid insufficiency. 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 Culture report of the bone shows several organisms which are grown and she has been put on Bactrim DS one twice a day for 14 days. The patient's bariatric surgeon Dr. Duke Salvia did speak with me today regarding the application of ACell and  suggested that he would be happy to apply this in case the patient was ready for it. I had a detailed discussion with him regarding the patient's condition and I have recommended at the present time the patient would benefit from hyperbaric oxygen therapy, IV antibiotics and supportive wound care. if at some stage the wound was ready for application of a cell I would be happy to talk to him about taking over her care. He agreed with the treatment plan and said he would be in touch. Readmission: 11-15-2021 upon evaluation today patient presents for initial inspection here in our clinic concerning issues that she has been having with a wound on the plantar aspect of her right foot. I did review notes as well and it appears that she had a bone spur noted but no signs of osteomyelitis this was in May when she saw Dr. Caryl Comes. With that being said he apparently was recommended surgery to go in and remove the bone spur as he feels like that is the causative reason for the wound and indeed I feel like that may be the case as well based on what I am seeing. Fortunately I do not see any evidence of active infection locally or systemically right now although there is definitely some callus buildup and there is definitely more fluid trapped underneath that is hard to even tell which is what we are seeing currently working have to remove some of this callus in order to identify what is even going on underneath and how deep this really goes. The patient does have a history of diabetes mellitus type 2, diabetic neuropathy, and hypertension. Otherwise there are no major medical problems that would affect her wound healing. Her most recent hemoglobin A1c was on 06-16-2021 and is stated to be good at 6.7 11-29-2021 upon evaluation today patient appears to be doing well currently in regard to her  wound. She has been tolerating the dressing changes without complication. Fortunately there does not appear to be any evidence  of active infection locally or systemically which is great news. No fevers, chills, nausea, vomiting, or diarrhea. I do think the Hydrofera Blue rope is doing decently well. With that being said she does have a little callus that builds up I am after cleaning this away as well as cleaning some of the base of the wound as well. She does have her MRI scheduled for later today. 12-20-2021 upon evaluation today patient appears to be doing well currently in regard to her heel ulcer. This is actually significantly improved compared to previous evaluations. I do think that we are on the right track here and we are seeing some definitive improvements. Overall I think that the patient is on the right track. I do not see any evidence of active infection systemically and locally I feel like this is also doing much better. She has been on doxycycline which I placed her on beginning 11-30-2021. This will be for 2 months and she has not yet completed the first month.Marland Kitchen 01-03-2022 upon evaluation today patient appears to be doing well currently in regard to her wound. This is actually showing signs of excellent improvement is noted completely to the surface there is just a little bit of biofilm although we are can have to clear this away with sharp Mauch, Raseel F. ([id number]) debridement as she is saline and gauze treatment is not enough to get this loosened today. 01-03-2022 upon evaluation today patient appears to be doing about the same in regard to her wound. The good news is she still on the antibiotics and nothing seems to be getting worse but notices that she is still not really showing a lot of improvement in the undermining area as of yet. Not really what we will try to work on here is get some undermining to reattach them to family. To that end I think that we may need to be packing this a little bit more tightly with the collagen and doing it dry. If we do this then it will start to moisten and resolve and it  will not actually be over packed but hopefully should allow for more significant healing going forward. Patient voiced understanding and is in agreement with that plan. 01-17-2022 upon evaluation today patient appears to be doing about the same with regard to her wound. This is on the plantar aspect of the heel. With that being said it appears that this is not being packed as it should be. We needed to really be filling the undermining space with the collagen she had the dressing change this morning and that is definitely not what was there I think he might see some of the miscommunication in regards to the notes to home health. We will try to clarify that. 02-14-2022 upon evaluation today patient appears to be doing excellent in regard to her wound. She is actually tolerating the dressing changes without complication. Fortunately there does not appear to be any evidence of active infection locally or systemically at this time which is great news. No fevers, chills, nausea, vomiting, or diarrhea. I do believe that the collagen is doing a great job here. 02-28-2022 upon evaluation today patient appears to be doing well with regard to her wound although it may be that this was a little bit over packed with the collagen. We want it to be filling the space but not overly fill in the  space. With that being said I think in general she seems to be making some good progress here though and I do believe the collagen has been much better for her than what we were doing previous with the [facility]. Electronic Signature(s) Signed: 03/01/2022 5:50:56 PM By: Worthy Keeler PA-C Entered By: Worthy Keeler on 03/01/2022 17:50:56 Forgette, Diantha F. ([id number]) -------------------------------------------------------------------------------- Physical Exam Details Patient Name: Muellner, Sherlon F. Date of Service: 02/28/2022 10:30 AM Medical Record Number: [id number] Patient Account Number: [id number] Date of  Birth/Sex: 08-02-1950 (71 y.o. F) Treating RN: Carlene Coria Primary Care Provider: Denton Lank Other Clinician: Massie Kluver Referring Provider: Denton Lank Treating Provider/Extender: Jeri Cos Weeks in Treatment: 35 Constitutional Well-nourished and well-hydrated in no acute distress. Respiratory normal breathing without difficulty. Psychiatric this patient is able to make decisions and demonstrates good insight into disease process. Alert and Oriented x 3. pleasant and cooperative. Notes Upon inspection patient's wound bed actually showed signs of good granulation and epithelization at this point. Fortunately I do not see any evidence of active infection locally or systemically which is great news and overall I am extremely pleased with where things stand currently. Electronic Signature(s) Signed: 03/01/2022 5:51:40 PM By: Worthy Keeler PA-C Entered By: Worthy Keeler on 03/01/2022 17:51:40 Placide, Donelda F. ([id number]) -------------------------------------------------------------------------------- Physician Orders Details Patient Name: Nedeau, Rosland F. Date of Service: 02/28/2022 10:30 AM Medical Record Number: [id number] Patient Account Number: [id number] Date of Birth/Sex: Jun 28, 1950 (71 y.o. 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 o Nurse Visit as needed [facility] for wound  care. May utilize formulary equivalent dressing for wound treatment orders unless otherwise specified. Home Health Nurse may visit [facility] to address patientos wound care needs. o Scheduled days for dressing changes to be completed; exception, patient has scheduled wound care visit that day. o **Please direct any NON-WOUND related issues/requests for orders to patient's Primary Care Physician. **If current dressing causes regression in wound condition, may D/C ordered dressing product/s and apply Normal Saline Moist Dressing daily until next [place] or Other MD appointment. **Notify [facility] of regression in wound condition at [phone]. o Other Home Health Orders/Instructions: - Pack silver collagen lightly into wound undermining 12-12 2.5cm. PLEASE pack in prisma (silver collagen) DRY and DO NOT moisten as patient is having increased drainage Bathing/ Shower/ Hygiene o Wash wounds with antibacterial soap and water. o May shower; gently cleanse wound with antibacterial soap, rinse and pat dry prior to dressing wounds o No tub bath. Off-Loading o Other: - continue to wear crow walker boot to relieve pressure off wound Medications-Please add to medication list. o P.O. Antibiotics - d/c doxycycine once last dose is taken Wound Treatment Wound #5 - Foot Wound Laterality: Plantar, Right Cleanser: Byram Ancillary Kit - 15 Day Supply 3 x Per Week/30 Days Discharge Instructions: Use supplies as instructed; Kit contains: (15) Saline Bullets; (15) 3x3 Gauze; 15 pr Gloves Cleanser: Soap and Water 3 x Per Week/30 Days Discharge Instructions: Gently cleanse wound with  antibacterial soap, rinse and pat dry prior to dressing wounds Primary Dressing: Prisma 4.34 (in) (Home Health) 3 x Per Week/30 Days Discharge Instructions: PLEASE PACK DRY INTO WOUND (Undermining 12 oclock to 12 oclock , deepest point is at heel Secondary Dressing: (BORDER) Zetuvit Plus SILICONE  BORDER Dressing 4x4 (in/in) ([place]) 3 x Per Week/30 Days Discharge Instructions: Please do not put silicone bordered dressings under wraps. Use non-bordered dressing only. Electronic Signature(s) Signed: 02/28/2022 4:48:52 PM By: Massie Kluver Signed: 03/01/2022 6:10:21 PM By: Worthy Keeler PA-C Entered By: Massie Kluver on 02/28/2022 11:36:11 Castillo, Billee F. ([id number]) Boggan, Dennie F. ([id number]) -------------------------------------------------------------------------------- Problem List Details Patient Name: Knape, Laisha F. Date of Service: 02/28/2022 10:30 AM Medical Record Number: [id number] Patient Account Number: [id number] Date of Birth/Sex: 06-26-1950 (71 y.o. 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 ([id number]) -------------------------------------------------------------------------------- Progress Note Details Patient Name: Kubly, Vianka F. Date of Service: 02/28/2022 10:30 AM Medical Record Number: [id number] Patient Account Number: [id number] Date of Birth/Sex: 17-Apr-1951 (71 y.o. F) Treating RN: Carlene Coria Primary Care Provider: Denton Lank Other Clinician: Massie Kluver Referring Provider: Denton Lank Treating Provider/Extender: Skipper Cliche in Treatment: 15 Subjective Chief Complaint Information obtained from Patient Right Foot Ulcer History of Present Illness (HPI) this is a 71 year old morbidly obese patient who has uncontrolled diabetes and has not been taking treatment for diabetes for at least 6 months. She presents with bilateral heel ulcers of only 2 weeks duration. He was sent to [place] by her PCP Dr. Veda Canning. After this the patient was seen both by the podiatrist Dr. Caryl Comes and her vascular surgeon Dr. Delana Meyer. from what I understand from the son Dr. Delana Meyer has done some process on her and a procedure and says that he has finished working on her and no further vascular intervention is to be done. the patient was sent was for a further evaluation of her wounds. Her son has been doing some dressings at home on a twice a daily basis with Silvadene ointment. Some recent blood work done on 07/19/2014 shows that her hemoglobin was 7.9 with a hematocrit of 25.7. Her white count was 12.7 and her glucose was 175 mg/dL. Her hemoglobin A1c was 8.4%. She has had no recent x-rays available for review. 07/31/2014. I was able to review the office visit notes from Dr. Delana Meyer on 07/14/2014. The patient's ABI on the right was 0.85 and on the left was 0.89 and both were abnormal. His assessment and plan was that the patient had severe atherosclerotic changes of both lower extremities associated with ulceration and tissue loss of the foot and this represent a limb threatening ischemia and he recommended a angiography of the lower extremities with hope for intervention for limb salvage. This was scheduled the procedure was done on 07/22/14 The surgery performed was an abdominal aortogram with the right lower extremity runoff and catheter placement. 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 08/07/2014 x-rays were done of both feet on March 10. The right heel showed soft tissue wound, with some calcaneus lesion suspicious for osteomyelitis. An MRI would be recommended. the left heel did not show any evidence of osteomyelitis. 08/14/2014 -- Reports on Exilda Waldschmidt : Cardiology reports reviewed from the [facility] clinic by Dr. Nehemiah Massed 2-D echocardiography done on 08/09/2013 showed that her normal LV systolic function with ejection fraction of 55% She also had mild mitral and tricuspid insufficiency. 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 Culture report of the bone shows several organisms which are grown and she has been put on Bactrim DS one twice a day for 14 days. The patient's bariatric surgeon Dr. Duke Salvia did speak with me today regarding the application of ACell and suggested that he would be happy to apply this in case the patient was ready for it. I had a detailed discussion with him regarding the patient's condition and I have recommended at the present time the patient would benefit from hyperbaric oxygen therapy, IV antibiotics and supportive wound care. if at some stage the wound was ready for application of a cell I would be happy to talk to him about taking over her care. He agreed with the treatment plan and said he would be in touch. Readmission: 11-15-2021 upon evaluation today patient presents for initial inspection here in our clinic concerning issues that she has been having with a wound on the plantar aspect of her right foot. I did review notes as well and it appears that she had a bone spur noted but no signs of osteomyelitis this was in May when she saw Dr. Caryl Comes. With that being said he apparently  was recommended surgery to go in and remove the bone spur as he feels like that is the causative reason for the wound and indeed I feel like that may be the case as well based on what I am seeing. Fortunately I do not see any evidence of active infection locally or systemically right now although there is definitely some callus buildup and there is definitely more fluid trapped underneath that is hard to even tell which is what we are seeing currently working have to remove some of this callus in order to identify what is even going on underneath and how deep this really goes. The patient does have a history of diabetes mellitus type 2, diabetic neuropathy, and hypertension. Otherwise there are no major medical problems that would affect her wound healing. Her most recent hemoglobin A1c was on 06-16-2021 and is stated to be good at 6.7 11-29-2021 upon evaluation today patient appears to be doing well currently in regard to her wound. She has been tolerating the dressing changes without complication. Fortunately there does not appear to be any evidence of active infection locally or systemically which is great news. No fevers, chills, nausea, vomiting, or diarrhea. I do think the Hydrofera Blue rope is doing decently well. With that being said she does have a little callus that builds up I am after cleaning this away  as well as cleaning some of the base of the wound as well. She does have her MRI scheduled for later today. 12-20-2021 upon evaluation today patient appears to be doing well currently in regard to her heel ulcer. This is actually significantly improved compared to previous evaluations. I do think that we are on the right track here and we are seeing some definitive improvements. Overall I think that the patient is on the right track. I do not see any evidence of active infection systemically and locally I feel like this is also doing much better. She has been on doxycycline which I placed her  on beginning 11-30-2021. This will be for 2 months and she has not yet completed the first Winrow, Aundria F. ([id number]) month.Marland Kitchen 01-03-2022 upon evaluation today patient appears to be doing well currently in regard to her wound. This is actually showing signs of excellent improvement is noted completely to the surface there is just a little bit of biofilm although we are can have to clear this away with sharp debridement as she is saline and gauze treatment is not enough to get this loosened today. 01-03-2022 upon evaluation today patient appears to be doing about the same in regard to her wound. The good news is she still on the antibiotics and nothing seems to be getting worse but notices that she is still not really showing a lot of improvement in the undermining area as of yet. Not really what we will try to work on here is get some undermining to reattach them to family. To that end I think that we may need to be packing this a little bit more tightly with the collagen and doing it dry. If we do this then it will start to moisten and resolve and it will not actually be over packed but hopefully should allow for more significant healing going forward. Patient voiced understanding and is in agreement with that plan. 01-17-2022 upon evaluation today patient appears to be doing about the same with regard to her wound. This is on the plantar aspect of the heel. With that being said it appears that this is not being packed as it should be. We needed to really be filling the undermining space with the collagen she had the dressing change this morning and that is definitely not what was there I think he might see some of the miscommunication in regards to the notes to home health. We will try to clarify that. 02-14-2022 upon evaluation today patient appears to be doing excellent in regard to her wound. She is actually tolerating the dressing changes without complication. Fortunately there does not appear to be  any evidence of active infection locally or systemically at this time which is great news. No fevers, chills, nausea, vomiting, or diarrhea. I do believe that the collagen is doing a great job here. 02-28-2022 upon evaluation today patient appears to be doing well with regard to her wound although it may be that this was a little bit over packed with the collagen. We want it to be filling the space but not overly fill in the space. With that being said I think in general she seems to be making some good progress here though and I do believe the collagen has been much better for her than what we were doing previous with the [facility]. Objective Constitutional Well-nourished and well-hydrated in no acute distress. Vitals Time Taken: 11:04 AM, Height: 67 in, Weight: 248 lbs, BMI: 38.8, Temperature: 98.0 F,  Pulse: 91 bpm, Respiratory Rate: 18 breaths/min, Blood Pressure: 145/80 mmHg. Respiratory normal breathing without difficulty. Psychiatric this patient is able to make decisions and demonstrates good insight into disease process. Alert and Oriented x 3. pleasant and cooperative. General Notes: Upon inspection patient's wound bed actually showed signs of good granulation and epithelization at this point. Fortunately I do not see any evidence of active infection locally or systemically which is great news and overall I am extremely pleased with where things stand currently. Integumentary (Hair, Skin) Wound #5 status is Open. Original cause of wound was Pressure Injury. The date acquired was: 06/23/2021. The wound has been in treatment 15 weeks. The wound is located on the [place]. The wound measures 0.5cm length x 0.7cm width x 0.6cm depth; 0.275cm^2 area and 0.165cm^3 volume. There is Fat Layer (Subcutaneous Tissue) exposed. There is no tunneling noted, however, there is undermining starting at 12:00 and ending at 12:00 with a maximum distance of 1.5cm. There is a medium amount of  serosanguineous drainage noted. The wound margin is well defined and not attached to the wound base. There is large (67-100%) red, pale granulation within the wound bed. There is a small (1-33%) amount of necrotic tissue within the wound bed including Adherent Slough. 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 ([id number]) Procedures Wound #5 Pre-procedure diagnosis of Wound #5 is a Diabetic Wound/Ulcer of the Lower Extremity located on the Right,Plantar Foot .Severity of Tissue Pre Debridement is: Fat layer exposed. There was a Selective/Open Wound Non-Viable Tissue Debridement with a total area of 1 sq cm performed by Tommie Sams., PA-C. With the following instrument(s): Curette to remove Viable and Non-Viable tissue/material. Material removed includes Callus. A time out was conducted at 11:22, prior to the start of the procedure. A Minimum amount of bleeding was controlled with Pressure. The procedure was tolerated well. Post Debridement Measurements: 0.5cm length x 0.7cm width x 0.6cm depth; 0.165cm^3 volume. Character of Wound/Ulcer Post Debridement is stable. Severity of Tissue Post Debridement is: Fat layer exposed. Post procedure Diagnosis Wound #5: Same as Pre-Procedure Plan Follow-up Appointments: Return Appointment in 2 weeks. Nurse Visit as needed Home Health: [place]: - [place] for wound care. May utilize formulary equivalent dressing for wound treatment orders unless otherwise specified. Home Health Nurse may visit [facility] to address patient s wound care needs. Scheduled days for dressing changes to be completed; exception, patient has scheduled wound care visit that day. **Please direct any NON-WOUND related  issues/requests for orders to patient's Primary Care Physician. **If current dressing causes regression in wound condition, may D/C ordered dressing product/s and apply Normal Saline Moist Dressing daily until next [facility] or Other MD appointment. **Notify [facility] of regression in wound condition at [phone]. Other Home Health Orders/Instructions: - Pack silver collagen lightly into wound undermining 12-12 2.5cm. PLEASE pack in prisma (silver collagen) DRY and DO NOT moisten as patient is having increased drainage Bathing/ Shower/ Hygiene: Wash wounds with antibacterial soap and water. May shower; gently cleanse wound with antibacterial soap, rinse and pat dry prior to dressing wounds No tub bath. Off-Loading: Other: - continue to wear crow walker boot to relieve pressure off wound Medications-Please add to medication list.: P.O. Antibiotics - d/c doxycycine once last dose is taken WOUND #5: -  Foot Wound Laterality: Plantar, Right Cleanser: [employer] Ancillary Kit - 15 Day Supply 3 x Per Week/30 Days Discharge Instructions: Use supplies as instructed; Kit contains: (15) Saline Bullets; (15) 3x3 Gauze; 15 pr Gloves Cleanser: Soap and Water 3 x Per Week/30 Days Discharge Instructions: Gently cleanse wound with antibacterial soap, rinse and pat dry prior to dressing wounds Primary Dressing: Prisma 4.34 (in) (Home Health) 3 x Per Week/30 Days Discharge Instructions: PLEASE PACK DRY INTO WOUND (Undermining 12 oclock to 12 oclock , deepest point is at heel Secondary Dressing: (BORDER) Zetuvit Plus SILICONE BORDER Dressing 4x4 (in/in) (Home Health) 3 x Per Week/30 Days Discharge Instructions: Please do not put silicone bordered dressings under wraps. Use non-bordered dressing only. 1. I am going to suggest that we continue with the silver collagen again this needs to be packed into the areas of undermining but not too tightly that it does not allow it to heal any. 2. I am  also can recommend that we have the patient continue with the bordered foam dressing to cover which I think is doing well. 3 she will continue with the doxycycline until this is complete she only has a few days left at that point she has been on it for 3 months I think should be good for discharge. We will see patient back for reevaluation in 1 week here in the clinic. If anything worsens or changes patient will contact our office for additional recommendations. Electronic Signature(s) Signed: 03/01/2022 5:52:10 PM By: Worthy Keeler PA-C Entered By: Worthy Keeler on 03/01/2022 17:52:10 Benkert, Kyung F. ([id number]) -------------------------------------------------------------------------------- SuperBill Details Patient Name: Christen, Normalee F. Date of Service: 02/28/2022 Medical Record Number: [id number] Patient Account Number: [id number] Date of Birth/Sex: 1950-09-17 (71 y.o. F) Treating RN: Carlene Coria Primary Care Provider: Denton Lank Other Clinician: Massie Kluver Referring Provider: Denton Lank Treating Provider/Extender: Jeri Cos Weeks in Treatment: 15 Diagnosis Coding ICD-10 Codes Code Description E11.621 Type 2 diabetes mellitus with foot ulcer L97.512 Non-pressure chronic ulcer of other part of right foot with fat layer exposed E11.43 Type 2 diabetes mellitus with diabetic autonomic (poly)neuropathy I10 Essential (primary) hypertension Facility Procedures CPT4 Code: [id number] Description: [phone] - [facility] 1ST 20 SQ CM OR < Modifier: Quantity: 1 CPT4 Code: Description: ICD-10 Diagnosis Description L97.512 Non-pressure chronic ulcer of other part of right foot with fat layer exp Modifier: osed Quantity: Physician Procedures CPT4 Code: [id number] Description: 97597 - WC PHYS DEBR WO ANESTH 20 SQ CM Modifier: Quantity: 1 CPT4 Code: Description: ICD-10 Diagnosis Description L97.512 Non-pressure chronic ulcer of other part of right foot with fat layer  exp Modifier: osed Quantity: Electronic Signature(s) Signed: 03/01/2022 5:52:23 PM By: Worthy Keeler PA-C Entered By: Worthy Keeler on 03/01/2022 17:52:22

## 2022-03-14 ENCOUNTER — Encounter: Payer: 59 | Admitting: Physician Assistant

## 2022-03-14 DIAGNOSIS — E11621 Type 2 diabetes mellitus with foot ulcer: Secondary | ICD-10-CM | POA: Diagnosis not present

## 2022-03-14 NOTE — Progress Notes (Signed)
[name], TRUMBO ([id number]) [id number] Page 1 of 9 Visit Report for 03/14/2022 Chief Complaint Document Details Patient Name: Date of Service: [name], [name]. 03/14/2022 9:45 A M Medical Record Number: [id number] Patient Account Number: [id number] Date of Birth/Sex: Treating RN: 1950/12/16 (71 y.o. [name] Primary Care Provider: Denton Lank Other Clinician: Massie Kluver Referring Provider: Treating Provider/Extender: Almedia Balls in Treatment: 17 Information Obtained from: Patient Chief Complaint Right Foot Ulcer Electronic Signature(s) Signed: 03/14/2022 9:45:47 AM By: Worthy Keeler PA-C Entered By: Worthy Keeler on 03/14/2022 09:45:47 -------------------------------------------------------------------------------- Debridement Details Patient Name: Date of Service: [name], [name]. 03/14/2022 9:45 A M Medical Record Number: [id number] Patient Account Number: [id number] Date of Birth/Sex: Treating RN: [birth year] (71 y.o. [name] Primary Care Provider: Denton Lank Other Clinician: Massie Kluver Referring Provider: Treating Provider/Extender: Almedia Balls in Treatment: 17 Debridement Performed for Assessment: Wound #5 Right,Plantar Foot Performed By: Physician Tommie Sams., PA-C Debridement Type: Debridement Severity of Tissue Pre Debridement: Fat layer exposed Level of Consciousness (Pre-procedure): Awake and Alert Pre-procedure Verification/Time Out Yes - 09:58 Taken: Start Time: 09:58 T Area Debrided (L x W): otal 1.5 (cm) x 1.5 (cm) = 2.25 (cm) Tissue and other material debrided: Viable, Non-Viable, Callus, Slough, Subcutaneous, Biofilm, Slough Level: Skin/Subcutaneous Tissue Debridement Description: Excisional Instrument: Curette Bleeding: Moderate Hemostasis Achieved: Silver Nitrate End Time: 10:04 Response to Treatment: Procedure was tolerated well Level of Consciousness (Post-  Awake and Alert procedure): Sween, Daniell [name] ([id number]) [id number] Page 2 of 9 Post Debridement Measurements of Total Wound Length: (cm) 1 Width: (cm) 1 Depth: (cm) 0.5 Volume: (cm) 0.393 Character of Wound/Ulcer Post Debridement: Stable Severity of Tissue Post Debridement: Fat layer exposed Post Procedure Diagnosis Same as Pre-procedure Electronic Signature(s) Signed: 03/14/2022 5:38:46 PM By: Massie Kluver Signed: 03/14/2022 6:00:27 PM By: Gretta Cool, BSN, RN, CWS, [name] RN, BSN Signed: 03/15/2022 6:28:37 PM By: Worthy Keeler PA-C Entered By: Massie Kluver on 03/14/2022 10:05:28 -------------------------------------------------------------------------------- HPI Details Patient Name: Date of Service: [name], [name]. 03/14/2022 9:45 A M Medical Record Number: [id number] Patient Account Number: [id number] Date of Birth/Sex: Treating RN: May 16, 1951 (71 y.o. [name] Primary Care Provider: Denton Lank Other Clinician: Massie Kluver Referring Provider: Treating Provider/Extender: Almedia Balls in Treatment: 17 History of Present Illness HPI Description: this is a 71 year old morbidly obese patient who has uncontrolled diabetes and has not been taking treatment for diabetes for at least 6 months. She presents with bilateral heel ulcers of only 2 weeks duration. He was sent to [place] by her PCP Dr. Veda Canning. After this the patient was seen both by the podiatrist Dr. Caryl Comes and her vascular surgeon Dr. Delana Meyer. from what I understand from the son Dr. Delana Meyer has done some process on her and a procedure and says that he has finished working on her and no further vascular intervention is to be done. the patient was sent was for a further evaluation of her wounds. Her son has been doing some dressings at home on a twice a daily basis with Silvadene ointment. Some recent blood work done on 07/19/2014 shows that her hemoglobin was 7.9 with a  hematocrit of 25.7. Her white count was 12.7 and her glucose was 175 mg/dL. Her hemoglobin A1c was 8.4%. She has had no recent x-rays available for review. 07/31/2014. I was able to review the office visit notes from Dr. Delana Meyer on 07/14/2014. The patient's ABI on the right was 0.85  and on the left was 0.89 and both were abnormal. His assessment and plan was that the patient had severe atherosclerotic changes of both lower extremities associated with ulceration and tissue loss of the foot and this represent a limb threatening ischemia and he recommended a angiography of the lower extremities with hope for intervention for limb salvage. This was scheduled the procedure was done on 07/22/14 The surgery performed was an abdominal aortogram with the right lower extremity runoff and catheter placement. 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 08/07/2014 x-rays were done of both feet on March 10. The right heel showed soft tissue wound, with some calcaneus lesion suspicious for osteomyelitis. An MRI would be recommended. the left heel did not show any evidence of osteomyelitis. 08/14/2014 -- Reports on Josephyne T :app Cardiology reports reviewed from the [facility] clinic by Dr. Nehemiah Massed 2-D echocardiography done on 08/09/2013 showed that her normal LV systolic function with ejection fraction of 55% She also had mild mitral and tricuspid insufficiency. 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 Culture report of the bone shows several organisms which are grown and she has been put on Bactrim DS one  twice a day for 14 days. The patient's bariatric surgeon Dr. Duke Salvia did speak with me today regarding the application of ACell and suggested that he would be happy to apply this in case the patient was ready for it. I had a detailed discussion with him regarding the patient's condition and I have recommended at the present time the patient would benefit from hyperbaric oxygen therapy, IV antibiotics and supportive wound care. if at some stage the wound was ready for application of a cell I would be happy to talk to him about taking over her care. He agreed with the treatment plan and said he would be in touch. MYREL, RAPPLEYE ([id number]) [id number] Page 3 of 9 Readmission: 11-15-2021 upon evaluation today patient presents for initial inspection here in our clinic concerning issues that she has been having with a wound on the plantar aspect of her right foot. I did review notes as well and it appears that she had a bone spur noted but no signs of osteomyelitis this was in May when she saw Dr. Caryl Comes. With that being said he apparently was recommended surgery to go in and remove the bone spur as he feels like that is the causative reason for the wound and indeed I feel like that may be the case as well based on what I am seeing. Fortunately I do not see any evidence of active infection locally or systemically right now although there is definitely some callus buildup and there is definitely more fluid trapped underneath that is hard to even tell which is what we are seeing currently working have to remove some of this callus in order to identify what is even going on underneath and how deep this really goes. The patient does have a history of diabetes mellitus type 2, diabetic neuropathy, and hypertension. Otherwise there are no major medical problems that would affect her wound healing. Her most  recent hemoglobin A1c was on 06-16-2021 and is stated to be good at 6.7 11-29-2021  upon evaluation today patient appears to be doing well currently in regard to her wound. She has been tolerating the dressing changes without complication. Fortunately there does not appear to be any evidence of active infection locally or systemically which is great news. No fevers, chills, nausea, vomiting, or diarrhea. I do think the Hydrofera Blue rope is doing decently well. With that being said she does have a little callus that builds up I am after cleaning this away as well as cleaning some of the base of the wound as well. She does have her MRI scheduled for later today. 12-20-2021 upon evaluation today patient appears to be doing well currently in regard to her heel ulcer. This is actually significantly improved compared to previous evaluations. I do think that we are on the right track here and we are seeing some definitive improvements. Overall I think that the patient is on the right track. I do not see any evidence of active infection systemically and locally I feel like this is also doing much better. She has been on doxycycline which I placed her on beginning 11-30-2021. This will be for 2 months and she has not yet completed the first month.Marland Kitchen 01-03-2022 upon evaluation today patient appears to be doing well currently in regard to her wound. This is actually showing signs of excellent improvement is noted completely to the surface there is just a little bit of biofilm although we are can have to clear this away with sharp debridement as she is saline and gauze treatment is not enough to get this loosened today. 01-03-2022 upon evaluation today patient appears to be doing about the same in regard to her wound. The good news is she still on the antibiotics and nothing seems to be getting worse but notices that she is still not really showing a lot of improvement in the undermining area as of yet. Not really what we will try to work on here is get some undermining to reattach them to family. T  that end I think that we may need to be packing this a little bit more tightly with the o collagen and doing it dry. If we do this then it will start to moisten and resolve and it will not actually be over packed but hopefully should allow for more significant healing going forward. Patient voiced understanding and is in agreement with that plan. 01-17-2022 upon evaluation today patient appears to be doing about the same with regard to her wound. This is on the plantar aspect of the heel. With that being said it appears that this is not being packed as it should be. We needed to really be filling the undermining space with the collagen she had the dressing change this morning and that is definitely not what was there I think he might see some of the miscommunication in regards to the notes to home health. We will try to clarify that. 02-14-2022 upon evaluation today patient appears to be doing excellent in regard to her wound. She is actually tolerating the dressing changes without complication. Fortunately there does not appear to be any evidence of active infection locally or systemically at this time which is great news. No fevers, chills, nausea, vomiting, or diarrhea. I do believe that the collagen is doing a great job here. 02-28-2022 upon evaluation today patient appears to be doing well with regard to her wound although it may  be that this was a little bit over packed with the collagen. We want it to be filling the space but not overly fill in the space. With that being said I think in general she seems to be making some good progress here though and I do believe the collagen has been much better for her than what we were doing previous with the [facility]. 03-14-2022 upon evaluation patient's wound is actually showing signs of good granulation and epithelization at this point. Fortunately I think they were showing signs of significant improvement the undermining is better compared to what  its been although we still do have some deeper areas especially toward the heel I do not feel any bone exposed which is good news. She is currently done with the doxycycline which I had her on for 2 months. We have seen dramatic improvement during that time and I am very pleased in that regard. With that being said she still does have some need for filling in of this undermining area and that is what we are using the collagen for at this point. Electronic Signature(s) Signed: 03/14/2022 10:11:06 AM By: Worthy Keeler PA-C Entered By: Worthy Keeler on 03/14/2022 10:11:06 -------------------------------------------------------------------------------- Physical Exam Details Patient Name: Date of Service: [name], [name]. 03/14/2022 9:45 A M Medical Record Number: [id number] Patient Account Number: [id number] Date of Birth/Sex: Treating RN: July 28, 1950 (71 y.o. [name] Primary Care Provider: Denton Lank Other Clinician: Massie Kluver Referring Provider: Treating Provider/Extender: Almedia Balls in Treatment: 49 Constitutional Well-nourished and well-hydrated in no acute distress. Respiratory normal breathing without difficulty. LATIESHA, HARADA ([id number]) [id number] Page 4 of 9 Psychiatric this patient is able to make decisions and demonstrates good insight into disease process. Alert and Oriented x 3. pleasant and cooperative. Notes Upon inspection patient's wound bed actually showed signs of good granulation and epithelization at this point. Fortunately I do not see any signs of infection locally or systemically which is great news and overall I am extremely pleased with where we stand I did perform debridement of clearway some of the necrotic debris she tolerated that without complication postdebridement wound bed is significantly improved. Electronic Signature(s) Signed: 03/14/2022 10:11:39 AM By: Worthy Keeler PA-C Entered By:  Worthy Keeler on 03/14/2022 10:11:39 -------------------------------------------------------------------------------- Physician Orders Details Patient Name: Date of Service: [name], [name]. 03/14/2022 9:45 A M Medical Record Number: [id number] Patient Account Number: [id number] Date of Birth/Sex: Treating RN: 1950-10-17 (71 y.o. 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 Nurse Visit as needed [place]: - [place] for wound care. May utilize formulary equivalent dressing for wound treatment orders unless otherwise specified. Home Health Nurse may visit PRN to address patients wound care needs. Scheduled days for dressing changes to be completed; exception, patient has scheduled wound care visit that day. **Please direct any NON-WOUND related issues/requests for orders to patient's Primary Care Physician. **If current dressing causes regression in wound condition, may D/C ordered dressing product/s and apply Normal Saline Moist Dressing daily until next [place]  or Other MD appointment. **Notify [facility] of regression in wound condition at [phone]. Other Home Health Orders/Instructions: - Pack silver collagen lightly into wound undermining 12-12 2.5cm. PLEASE pack in prisma (silver collagen) DRY and DO NOT moisten as patient is having increased drainage Bathing/ Shower/ Hygiene Wash wounds with antibacterial  soap and water. May shower; gently cleanse wound with antibacterial soap, rinse and pat dry prior to dressing wounds No tub bath. Off-Loading Other: - continue to wear crow walker boot to relieve pressure off wound Wound Treatment Wound #5 - Foot Wound Laterality: Plantar, Right Cleanser: [place] Ancillary Kit - 15 Day Supply 3 x Per Week/30 Days Discharge Instructions: Use supplies as instructed; Kit contains: (15) Saline Bullets; (15) 3x3 Gauze; 15 pr Gloves Cleanser: Soap and Water 3 x Per Week/30 Days Discharge Instructions: Gently cleanse wound with antibacterial soap, rinse and pat dry prior to dressing wounds Tasker, Ernesto [name] ([id number]) [id number] Page 5 of 9 Prim Dressing: Prisma 4.34 (in) (Home Health) 3 x Per Week/30 Days ary Discharge Instructions: PLEASE PACK DRY INTO WOUND (Undermining 12 oclock to 12 oclock , deepest point is at heel Secondary Dressing: (BORDER) Zetuvit Plus SILICONE BORDER Dressing 4x4 (in/in) ([place]) 3 x Per Week/30 Days Discharge Instructions: Please do not put silicone bordered dressings under wraps. Use non-bordered dressing only. Electronic Signature(s) Signed: 03/14/2022 5:38:46 PM By: Massie Kluver Signed: 03/15/2022 6:28:37 PM By: Worthy Keeler PA-C Entered By: Massie Kluver on 03/14/2022 10:06:50 -------------------------------------------------------------------------------- Problem List Details Patient Name: Date of Service: [name], Keriana [name]. 03/14/2022 9:45 A M Medical Record Number: [id number] Patient Account Number: [id number] Date of Birth/Sex: Treating RN: 1951-05-18 (71 y.o. 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 03/14/2022 9:45 A M Medical Record Number: [id number] Patient Account Number: [id number] Date of Birth/Sex: Treating RN: [birth year] (71 y.o. [name] Primary Care Provider: Denton Lank Other Clinician: Massie Kluver Referring Provider: Treating Provider/Extender: Almedia Balls in Treatment: 17 Subjective Chief Complaint Information obtained from Patient Right Foot Ulcer History of Present Illness (HPI) this is a 71 year old morbidly obese patient who has uncontrolled diabetes and has not been taking treatment for diabetes for at least 6 months. She presents with bilateral heel ulcers of only 2 weeks duration. He was sent to [place] by her PCP Dr. Veda Canning. After this the patient was seen both by the podiatrist Dr. Caryl Comes and her vascular surgeon Dr. Delana Meyer. from what I understand from the son Dr. Delana Meyer has done some process on her and a procedure and says that he has finished working on her and no further vascular intervention is to be done. the patient was sent was for a further evaluation of her wounds. Her son has been doing some dressings at home on a twice a daily basis with  Silvadene ointment. Some recent blood work done on 07/19/2014 shows that her hemoglobin was 7.9 with a hematocrit of 25.7. Her white count was 12.7 and her glucose  was 175 mg/dL. Her hemoglobin A1c was 8.4%. She has had no recent x-rays available for review. 07/31/2014. I was able to review the office visit notes from Dr. Delana Meyer on 07/14/2014. The patient's ABI on the right was 0.85 and on the left was 0.89 and both were abnormal. His assessment and plan was that the patient had severe atherosclerotic changes of both lower extremities associated with ulceration and tissue loss of the foot and this represent a limb threatening ischemia and he recommended a angiography of the lower extremities with hope for intervention for limb salvage. This was scheduled the procedure was done on 07/22/14 The surgery performed was an abdominal aortogram with the right lower extremity runoff and catheter placement. 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 08/07/2014 x-rays were done of both feet on March 10. The right heel showed soft tissue wound, with some calcaneus lesion suspicious for osteomyelitis. An MRI would be recommended. the left heel did not show any evidence of osteomyelitis. 08/14/2014 -- Reports on Kimmy T :app Cardiology reports reviewed from the [facility] clinic by Dr. Nehemiah Massed 2-D echocardiography done on 08/09/2013 showed that her normal LV systolic function with ejection fraction of 55% She also had mild mitral and tricuspid insufficiency. 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 Culture report of the bone shows several organisms which are grown and she has been put on Bactrim DS one twice a day for 14 days. The patient's bariatric surgeon Dr.  Duke Salvia did speak with me today regarding the application of ACell and suggested that he would be happy to apply this in case the patient was ready for it. I had a detailed discussion with him regarding the patient's condition and I have recommended at the present time the patient would benefit from hyperbaric oxygen therapy, IV antibiotics and supportive wound care. if at some stage the wound was ready for application of a cell I would be happy to talk to him about taking over her care. He agreed with the treatment plan and said he would be in touch. Readmission: 11-15-2021 upon evaluation today patient presents for initial inspection here in our clinic concerning issues that she has been having with a wound on the plantar aspect of her right foot. I did review notes as well and it appears that she had a bone spur noted but no signs of osteomyelitis this was in May when she saw Dr. Caryl Comes. With that being said he apparently was recommended surgery to go in and remove the bone spur as he feels like that is the causative reason for the wound and indeed I feel like that may be the case as well based on what I am seeing. Fortunately I do not see any evidence of active infection locally or systemically right now although there is definitely some callus buildup and there is definitely more fluid trapped underneath that is hard to even tell which  is what we are seeing currently working have to remove some of this callus in order to identify what is even going on underneath and how deep this really goes. The patient does have a history of diabetes mellitus type 2, diabetic neuropathy, and hypertension. Otherwise there are no major medical problems that would affect her wound healing. Her most recent hemoglobin A1c was on 06-16-2021 and is stated to be good at 6.7 11-29-2021 upon evaluation today patient appears to be doing well currently in regard to her wound. She has been tolerating the dressing changes  without complication. Fortunately there does not appear to be any evidence of active infection locally or systemically which is great news. No fevers, chills, nausea, vomiting, or diarrhea. I do think the Hydrofera Blue rope is doing decently well. With that being said she does have a little callus that builds up I am after cleaning this away as well as cleaning some of the base of the wound as well. She does have her MRI scheduled for later today. 12-20-2021 upon evaluation today patient appears to be doing well currently in regard to her heel ulcer. This is actually significantly improved compared to previous evaluations. I do think that we are on the right track here and we are seeing some definitive improvements. Overall I think that the patient is on the right track. I do not see any evidence of active infection systemically and locally I feel like this is also doing much better. She has been on doxycycline which I placed her on beginning 11-30-2021. This will be for 2 months and she has not yet completed the first month.Marland Kitchen 01-03-2022 upon evaluation today patient appears to be doing well currently in regard to her wound. This is actually showing signs of excellent improvement is Drew, Teila [name] ([id number]) [id number] Page 7 of 9 noted completely to the surface there is just a little bit of biofilm although we are can have to clear this away with sharp debridement as she is saline and gauze treatment is not enough to get this loosened today. 01-03-2022 upon evaluation today patient appears to be doing about the same in regard to her wound. The good news is she still on the antibiotics and nothing seems to be getting worse but notices that she is still not really showing a lot of improvement in the undermining area as of yet. Not really what we will try to work on here is get some undermining to reattach them to family. T that end I think that we may need to be packing this a  little bit more tightly with the o collagen and doing it dry. If we do this then it will start to moisten and resolve and it will not actually be over packed but hopefully should allow for more significant healing going forward. Patient voiced understanding and is in agreement with that plan. 01-17-2022 upon evaluation today patient appears to be doing about the same with regard to her wound. This is on the plantar aspect of the heel. With that being said it appears that this is not being packed as it should be. We needed to really be filling the undermining space with the collagen she had the dressing change this morning and that is definitely not what was there I think he might see some of the miscommunication in regards to the notes to home health. We will try to clarify that. 02-14-2022 upon evaluation today patient appears to be doing excellent in regard to her  wound. She is actually tolerating the dressing changes without complication. Fortunately there does not appear to be any evidence of active infection locally or systemically at this time which is great news. No fevers, chills, nausea, vomiting, or diarrhea. I do believe that the collagen is doing a great job here. 02-28-2022 upon evaluation today patient appears to be doing well with regard to her wound although it may be that this was a little bit over packed with the collagen. We want it to be filling the space but not overly fill in the space. With that being said I think in general she seems to be making some good progress here though and I do believe the collagen has been much better for her than what we were doing previous with the [facility]. 03-14-2022 upon evaluation patient's wound is actually showing signs of good granulation and epithelization at this point. Fortunately I think they were showing signs of significant improvement the undermining is better compared to what its been although we still do have some deeper areas  especially toward the heel I do not feel any bone exposed which is good news. She is currently done with the doxycycline which I had her on for 2 months. We have seen dramatic improvement during that time and I am very pleased in that regard. With that being said she still does have some need for filling in of this undermining area and that is what we are using the collagen for at this point. Objective Constitutional Well-nourished and well-hydrated in no acute distress. Vitals Time Taken: 9:46 AM, Height: 67 in, Weight: 248 lbs, BMI: 38.8, Temperature: 98.4 [name], Pulse: 81 bpm, Respiratory Rate: 18 breaths/min, Blood Pressure: 149/74 mmHg. Respiratory normal breathing without difficulty. Psychiatric this patient is able to make decisions and demonstrates good insight into disease process. Alert and Oriented x 3. pleasant and cooperative. General Notes: Upon inspection patient's wound bed actually showed signs of good granulation and epithelization at this point. Fortunately I do not see any signs of infection locally or systemically which is great news and overall I am extremely pleased with where we stand I did perform debridement of clearway some of the necrotic debris she tolerated that without complication postdebridement wound bed is significantly improved. Integumentary (Hair, Skin) Wound #5 status is Open. Original cause of wound was Pressure Injury. The date acquired was: 06/23/2021. The wound has been in treatment 17 weeks. The wound is located on the [place]. The wound measures 0.7cm length x 0.7cm width x 0.2cm depth; 0.385cm^2 area and 0.077cm^3 volume. There is Fat Layer (Subcutaneous Tissue) exposed. There is undermining starting at 1:00 and ending at 7:00 with a maximum distance of 2cm. There is a medium amount of serosanguineous drainage noted. The wound margin is well defined and not attached to the wound base. There is large (67-100%) red, pale granulation within the  wound bed. There is a small (1-33%) amount of necrotic tissue within the wound bed including Adherent Slough. 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 There was a Excisional Skin/Subcutaneous Tissue Debridement with a total area of 2.25 sq cm performed by Tommie Sams., PA-C. With the  following instrument(s): Curette to remove Viable and Non-Viable tissue/material. Material removed includes Callus, Subcutaneous Tissue, Slough, and Biofilm. A time out was conducted at 09:58, prior to the start of the procedure. A Moderate amount of bleeding was controlled with Silver Nitrate. The procedure MILEY, BLANCHETT [name] ([id number]) [id number] Page 8 of 9 was tolerated well. Post Debridement Measurements: 1cm length x 1cm width x 0.5cm depth; 0.393cm^3 volume. Character of Wound/Ulcer Post Debridement is stable. Severity of Tissue Post Debridement is: Fat layer exposed. Post procedure Diagnosis Wound #5: Same as Pre-Procedure Plan Follow-up Appointments: Return Appointment in 2 weeks. Nurse Visit as needed Home Health: [place]: - [place] for wound care. May utilize formulary equivalent dressing for wound treatment orders unless otherwise specified. Home Health Nurse may visit PRN to address patientoos wound care needs. Scheduled days for dressing changes to be completed; exception, patient has scheduled wound care visit that day. **Please direct any NON-WOUND related issues/requests for orders to patient's Primary Care Physician. **If current  dressing causes regression in wound condition, may D/C ordered dressing product/s and apply Normal Saline Moist Dressing daily until next [place] or Other MD appointment. **Notify [facility] of regression in wound condition at [phone]. Other Home Health Orders/Instructions: - Pack silver collagen lightly into wound undermining 12-12 2.5cm. PLEASE pack in prisma (silver collagen) DRY and DO NOT moisten as patient is having increased drainage Bathing/ Shower/ Hygiene: Wash wounds with antibacterial soap and water. May shower; gently cleanse wound with antibacterial soap, rinse and pat dry prior to dressing wounds No tub bath. Off-Loading: Other: - continue to wear crow walker boot to relieve pressure off wound WOUND #5: - Foot Wound Laterality: Plantar, Right Cleanser: [place] Ancillary Kit - 15 Day Supply 3 x Per Week/30 Days Discharge Instructions: Use supplies as instructed; Kit contains: (15) Saline Bullets; (15) 3x3 Gauze; 15 pr Gloves Cleanser: Soap and Water 3 x Per Week/30 Days Discharge Instructions: Gently cleanse wound with antibacterial soap, rinse and pat dry prior to dressing wounds Prim Dressing: Prisma 4.34 (in) (Home Health) 3 x Per Week/30 Days ary Discharge Instructions: PLEASE PACK DRY INTO WOUND (Undermining 12 oclock to 12 oclock , deepest point is at heel Secondary Dressing: (BORDER) Zetuvit Plus SILICONE BORDER Dressing 4x4 (in/in) (Home Health) 3 x Per Week/30 Days Discharge Instructions: Please do not put silicone bordered dressings under wraps. Use non-bordered dressing only. 1. I am good recommend currently that we have the patient go ahead and continue to monitor for any signs of worsening or infection. Obviously I do believe based on what we are seeing right now that the infection seems to be doing well but again now that she is off of the antibiotics we will see how things proceed over the next several weeks. 2. I am going to recommend we  continue with the collagen were packing this and lightly drying to the undermined areas and I think that still good to be the ideal thing to do. 3. I am also can recommend that we have the patient continue with the Zetuvit dressing to cover which I think has done decently well. We will see patient back for reevaluation in 2 weeks here in the clinic. If anything worsens or changes patient will contact our office for additional recommendations. Electronic Signature(s) Signed: 03/14/2022 10:17:15 AM By: Worthy Keeler PA-C Entered By: Worthy Keeler on 03/14/2022 10:17:14 -------------------------------------------------------------------------------- SuperBill Details Patient Name: Date of Service: [name], [name]. 03/14/2022 Medical Record  Number: [id number] Patient Account Number: [id number] Date of Birth/Sex: Treating RN: 01-21-1951 (71 y.o. [name] Primary Care Provider: Denton Lank Other Clinician: Massie Kluver Referring Provider: Treating Provider/Extender: Almedia Balls in Treatment: [address] Diagnosis Coding Grape, Francis Gaines ([id number]) [id number] Page 9 of 9 ICD-10 Codes Code Description E11.621 Type 2 diabetes mellitus with foot ulcer L97.512 Non-pressure chronic ulcer of other part of right foot with fat layer exposed E11.43 Type 2 diabetes mellitus with diabetic autonomic (poly)neuropathy I10 Essential (primary) hypertension Facility Procedures : CPT4 Code: [id number] Description: 53976 - DEB SUBQ TISSUE 20 SQ CM/< ICD-10 Diagnosis Description L97.512 Non-pressure chronic ulcer of other part of right foot with fat layer exposed Modifier: Quantity: 1 Physician Procedures : CPT4 Code Description Modifier [id number] - WC PHYS SUBQ TISS 20 SQ CM ICD-10 Diagnosis Description L97.512 Non-pressure chronic ulcer of other part of right foot with fat layer exposed Quantity: 1 Electronic Signature(s) Signed: 03/14/2022 10:21:28 AM  By: Worthy Keeler PA-C Entered By: Worthy Keeler on 03/14/2022 10:21:28

## 2022-03-14 NOTE — Progress Notes (Addendum)
DEBARA, KAMPHUIS ([id number]) [id number] Page 1 of 8 Visit Report for 03/14/2022 Arrival Information Details Patient Name: Date of Service: [name], [name] Patient Account Number: [id number] Date of Birth/Sex: Treating RN: December 24, 1950 (71 y.o. [name], [name] Primary Care Anwyn Kriegel: Denton Lank Other Clinician: Massie Kluver Referring Lucita Montoya: Treating Mat Stuard/Extender: Almedia Balls in Treatment: 17 Visit Information History Since Last Visit All ordered tests and consults were completed: No Patient Arrived: Wheel Chair Added or deleted any medications: No Arrival Time: 09:38 Any new allergies or adverse reactions: No Transfer Assistance: EasyPivot Patient Lift Had a fall or experienced change in No Patient Requires Transmission-Based No activities of daily living that may affect Precautions: risk of falls: Patient Has Alerts: Yes Hospitalized since last visit: No Patient Alerts: Patient on Blood Thinner Pain Present Now: No ABI 11/15/21 L 1.17 R 1.27 Electronic Signature(s) Signed: 03/14/2022 5:38:46 PM By: Massie Kluver Entered By: Massie Kluver on 03/14/2022 09:46:33 -------------------------------------------------------------------------------- Clinic Level of Care Assessment Details Patient Name: Date of Service: Daisy [place]. 03/14/2022 9:45 A M Medical Record Number: [id number] Patient Account Number: [id number] Date of Birth/Sex: Treating RN: Dec 22, 1950 (71 y.o. Marlowe Shores Primary Care Londyn Wotton: Denton Lank Other Clinician: Massie Kluver Referring Misha Antonini: Treating Edelmiro Innocent/Extender: Almedia Balls in Treatment: 17 Clinic Level of Care Assessment Items TOOL 1 Quantity Score '[]'$  - 0 Use when EandM and Procedure is performed on INITIAL visit ASSESSMENTS - Nursing Assessment / Reassessment '[]'$  - 0 General Physical Exam (combine w/ comprehensive  assessment (listed just below) when performed on new pt. evals) '[]'$  - 0 Comprehensive Assessment (HX, ROS, Risk Assessments, Wounds Hx, etc.) ASSESSMENTS - Wound and Skin Assessment / Reassessment '[]'$  - 0 Dermatologic / Skin Assessment (not related to wound area) ASSESSMENTS - Ostomy and/or Continence Assessment and Care Puccio, Rahmah F ([id number]) [id number] Page 2 of 8 '[]'$  - 0 Incontinence Assessment and Management '[]'$  - 0 Ostomy Care Assessment and Management (repouching, etc.) PROCESS - Coordination of Care '[]'$  - 0 Simple Patient / Family Education for ongoing care '[]'$  - 0 Complex (extensive) Patient / Family Education for ongoing care '[]'$  - 0 Staff obtains Programmer, systems, Records, T Results / Process Orders est '[]'$  - 0 Staff telephones HHA, Nursing Homes / Clarify orders / etc '[]'$  - 0 Routine Transfer to another Facility (non-emergent condition) '[]'$  - 0 Routine Hospital Admission (non-emergent condition) '[]'$  - 0 New Admissions / Biomedical engineer / Ordering NPWT Apligraf, etc. , '[]'$  - 0 Emergency Hospital Admission (emergent condition) PROCESS - Special Needs '[]'$  - 0 Pediatric / Minor Patient Management '[]'$  - 0 Isolation Patient Management '[]'$  - 0 Hearing / Language / Visual special needs '[]'$  - 0 Assessment of Community assistance (transportation, D/C planning, etc.) '[]'$  - 0 Additional assistance / Altered mentation '[]'$  - 0 Support Surface(s) Assessment (bed, cushion, seat, etc.) INTERVENTIONS - Miscellaneous '[]'$  - 0 External ear exam '[]'$  - 0 Patient Transfer (multiple staff / Civil Service fast streamer / Similar devices) '[]'$  - 0 Simple Staple / Suture removal (25 or less) '[]'$  - 0 Complex Staple / Suture removal (26 or more) '[]'$  - 0 Hypo/Hyperglycemic Management (do not check if billed separately) '[]'$  - 0 Ankle / Brachial Index (ABI) - do not check if billed separately Has the patient been seen at the hospital within the last three years: Yes Total Score: 0 Level Of  Care: ____ Electronic Signature(s) Signed: 03/14/2022 5:38:46 PM By: Massie Kluver Entered  ByMassie Kluver on 03/14/2022 10:06:56 -------------------------------------------------------------------------------- Encounter Discharge Information Details Patient Name: Date of Service: [name], STANSELL. 03/14/2022 9:45 A M Medical Record Number: [id number] Patient Account Number: [id number] Date of Birth/Sex: Treating RN: Oct 13, 1950 (71 y.o. Marlowe Shores Primary Care Keene Gilkey: Denton Lank Other Clinician: Massie Kluver Referring Jomar Denz: Treating Jaquasia Doscher/Extender: Almedia Balls in Treatment: 17 Encounter Discharge Information Items Post Procedure Vitals Discharge Condition: Stable Temperature (F): 98.4 Erskin, Luka F ([id number]) [id number] Page 3 of 8 Ambulatory Status: Wheelchair Pulse (bpm): 81 Discharge Destination: Home Respiratory Rate (breaths/min): 18 Transportation: Other Blood Pressure (mmHg): 149/74 Accompanied By: self Schedule Follow-up Appointment: Yes Clinical Summary of Care: Electronic Signature(s) Signed: 03/14/2022 5:38:46 PM By: Massie Kluver Entered By: Massie Kluver on 03/14/2022 10:22:19 -------------------------------------------------------------------------------- Lower Extremity Assessment Details Patient Name: Date of Service: [name], [name] 03/14/2022 9:45 A M Medical Record Number: [id number] Patient Account Number: [id number] Date of Birth/Sex: Treating RN: March 25, 1951 (71 y.o. Marlowe Shores Primary Care Jonan Seufert: Denton Lank Other Clinician: Massie Kluver Referring Kyleena Scheirer: Treating Yazmine Sorey/Extender: Almedia Balls in Treatment: 17 Electronic Signature(s) Signed: 03/14/2022 5:38:46 PM By: Massie Kluver Signed: 03/14/2022 6:00:27 PM By: Gretta Cool BSN, RN, CWS, [name] RN, BSN Entered By: Massie Kluver on 03/14/2022  09:54:52 -------------------------------------------------------------------------------- Multi Wound Chart Details Patient Name: Date of Service: [name], [name] 03/14/2022 9:45 A M Medical Record Number: [id number] Patient Account Number: [id number] Date of Birth/Sex: Treating RN: 06-Feb-1951 (72 y.o. Marlowe Shores Primary Care Nicklaus Alviar: Denton Lank Other Clinician: Massie Kluver Referring Tahiry Spicer: Treating Alyria Krack/Extender: Almedia Balls in Treatment: 17 Vital Signs Height(in): 19 Pulse(bpm): 9 Weight(lbs): 248 Blood Pressure(mmHg): 149/74 Body Mass Index(BMI): 38.8 Temperature(F): 98.4 Respiratory Rate(breaths/min): 18 [5:Photos:] [N/A:N/A] Right, Plantar Foot N/A N/A Wound Location: Pressure Injury N/A N/A Wounding Event: Diabetic Wound/Ulcer of the Lower N/A N/A Primary Etiology: Extremity Cataracts, Anemia, Hypertension, N/A N/A Comorbid History: Type II Diabetes, Osteoarthritis, Osteomyelitis, Neuropathy, Confinement Anxiety 06/23/2021 N/A N/A Date Acquired: 17 N/A N/A Weeks of Treatment: Open N/A N/A Wound Status: No N/A N/A Wound Recurrence: 0.7x0.7x0.2 N/A N/A Measurements L x W x D (cm) 0.385 N/A N/A A (cm) : rea 0.077 N/A N/A Volume (cm) : -442.30% N/A N/A % Reduction in A rea: -450.00% N/A N/A % Reduction in Volume: Grade 2 N/A N/A Classification: Medium N/A N/A Exudate A mount: Serosanguineous N/A N/A Exudate Type: red, brown N/A N/A Exudate Color: Well defined, not attached N/A N/A Wound Margin: Large (67-100%) N/A N/A Granulation A mount: Red, Pale N/A N/A Granulation Quality: Small (1-33%) N/A N/A Necrotic A mount: Fat Layer (Subcutaneous Tissue): Yes N/A N/A Exposed Structures: None N/A N/A Epithelialization: Treatment Notes Electronic Signature(s) Signed: 03/14/2022 5:38:46 PM By: Massie Kluver Entered By: Massie Kluver on 03/14/2022  09:55:03 -------------------------------------------------------------------------------- Multi-Disciplinary Care Plan Details Patient Name: Date of Service: [name], [name] 03/14/2022 9:45 A M Medical Record Number: [id number] Patient Account Number: [id number] Date of Birth/Sex: Treating RN: 1950/06/08 (71 y.o. Marlowe Shores Primary Care Ladasha Schnackenberg: Denton Lank Other Clinician: Massie Kluver Referring Tristan Proto: Treating Ovie Cornelio/Extender: Almedia Balls in Treatment: 17 Active Inactive Wound/Skin Impairment Nursing Diagnoses: Impaired tissue integrity Knowledge deficit related to ulceration/compromised skin integrity Goals: Ulcer/skin breakdown will have a volume reduction of 30% by week 4 Date Initiated: 11/15/2021 Date Inactivated: 01/17/2022 Target Resolution Date: 12/13/2021 Goal Status: Unmet Unmet Reason: comorbities Brethauer, Semone F ([id number]) [id number] Page 5 of 8 Ulcer/skin breakdown will have a volume reduction of 50% by  week 8 Date Initiated: 11/15/2021 Date Inactivated: 01/17/2022 Target Resolution Date: 01/10/2022 Goal Status: Unmet Unmet Reason: comorbities Ulcer/skin breakdown will have a volume reduction of 80% by week 12 Date Initiated: 11/15/2021 Target Resolution Date: 02/07/2022 Goal Status: Active Ulcer/skin breakdown will heal within 14 weeks Date Initiated: 11/15/2021 Target Resolution Date: 02/21/2022 Goal Status: Active Interventions: Assess patient/caregiver ability to obtain necessary supplies Assess patient/caregiver ability to perform ulcer/skin care regimen upon admission and as needed Assess ulceration(s) every visit Provide education on ulcer and skin care Notes: Electronic Signature(s) Signed: 03/14/2022 5:38:46 PM By: Massie Kluver Signed: 03/14/2022 6:00:27 PM By: Gretta Cool, BSN, RN, CWS, [name] RN, BSN Entered By: Massie Kluver on 03/14/2022  09:54:56 -------------------------------------------------------------------------------- Pain Assessment Details Patient Name: Date of Service: [name], [name] 03/14/2022 9:45 A M Medical Record Number: [id number] Patient Account Number: [id number] Date of Birth/Sex: Treating RN: 05/23/1951 (71 y.o. Marlowe Shores Primary Care Mireille Lacombe: Denton Lank Other Clinician: Massie Kluver Referring Bertin Inabinet: Treating Kevis Qu/Extender: Almedia Balls in Treatment: 17 Active Problems Location of Pain Severity and Description of Pain Patient Has Paino No Site Locations Pain Management and Medication Current Pain Management: Electronic Signature(s) Signed: 03/14/2022 5:38:46 PM By: Enzo Bi, Reegan F ([id number]) [id number] Page 6 of 8 Signed: 03/14/2022 6:00:27 PM By: Gretta Cool, BSN, RN, CWS, [name] RN, BSN Entered By: Massie Kluver on 03/14/2022 09:54:19 -------------------------------------------------------------------------------- Patient/Caregiver Education Details Patient Name: Date of Service: [name], [name] 10/23/2023andnbsp9:45 A M Medical Record Number: [id number] Patient Account Number: [id number] Date of Birth/Gender: Treating RN: 1950-10-24 (71 y.o. Marlowe Shores Primary Care Physician: Denton Lank Other Clinician: Massie Kluver Referring Physician: Treating Physician/Extender: Almedia Balls in Treatment: 17 Education Assessment Education Provided To: Patient Education Topics Provided Wound/Skin Impairment: Handouts: Other: continue wound care as directed Methods: Explain/Verbal Responses: State content correctly Electronic Signature(s) Signed: 03/14/2022 5:38:46 PM By: Massie Kluver Entered By: Massie Kluver on 03/14/2022 10:07:22 -------------------------------------------------------------------------------- Wound Assessment Details Patient Name: Date of Service: [name], Irva F. 03/14/2022  9:45 A M Medical Record Number: [id number] Patient Account Number: [id number] Date of Birth/Sex: Treating RN: 26-Jul-1950 (71 y.o. Mar[place]e Sh[place] Primary Care Lilyahna Sirmon: Denton Lank Other Clinician: Massie Kluver Referring Tamala Manzer: Treating Ilona Colley/Extender: Almedia Balls in Treatment: 17 Wound Status Wound Number: 5 Primary Diabetic Wound/Ulcer of the Lower Extremity Etiology: Wound Location: Right, Plantar Foot Wound Open Wounding Event: Pressure Injury Status: Date Acquired: 06/23/2021 Comorbid Cataracts, Anemia, Hypertension, Type II Diabetes, Osteoarthritis, Weeks Of Treatment: 17 History: Osteomyelitis, Neuropathy, Confinement Anxiety Clustered Wound: No Berrie, Yasmine F ([id number]) [id number] Page 7 of 8 Photos Wound Measurements Length: (cm) 0.7 Width: (cm) 0.7 Depth: (cm) 0.2 Area: (cm) 0.385 Volume: (cm) 0.077 % Reduction in Area: -442.3% % Reduction in Volume: -450% Epithelialization: None Undermining: Yes Starting Position (o'clock): 1 Ending Position (o'clock): 7 Maximum Distance: (cm) 2 Wound Description Classification: Grade 3 Wagner Verification: MRI Wound Margin: Well defined, not attached Exudate Amount: Medium Exudate Type: Serosanguineous Exudate Color: red, brown Foul Odor After Cleansing: No Slough/Fibrino No Wound Bed Granulation Amount: Large (67-100%) Exposed Structure Granulation Quality: Red, Pale Fat Layer (Subcutaneous Tissue) Exposed: Yes Necrotic Amount: Small (1-33%) Necrotic Quality: Adherent Slough Assessment Notes Late entry to correct staging. Electronic Signature(s) Signed: 03/28/2022 3:Daisy:51 PM By: Gretta Cool, BSN, RN, CWS, [name] RN, BSN Previous Signature: 03/14/2022 5:38:46 PM Version By: Massie Kluver Previous Signature: 03/14/2022 6:00:27 PM Version By: Gretta Cool, BSN, RN, CWS, [name] RN, BSN Entered By: Gretta Cool, BSN, RN, CWS, [name] on  03/28/2022  15:Daisy:51 -------------------------------------------------------------------------------- Vitals Details Patient Name: Date of Service: Daisy ROBBYE, DEDE 03/14/2022 9:45 A M Medical Record Number: [id number] Patient Account Number: [id number] Date of Birth/Sex: Treating RN: [birth year] Daisy, 1952 (71 y.o. Marlowe Shores Primary Care Margrett Kalb: Denton Lank Other Clinician: Massie Kluver Referring Lucillie Kiesel: Treating Safia Panzer/Extender: Almedia Balls in Treatment: 17 Vital Signs Time Taken: 09:46 Temperature (F): 98.4 Height (in): 67 Pulse (bpm): 81 Olexa, Paisely F ([id number]) [id number] Page 8 of 8 Weight (lbs): 248 Respiratory Rate (breaths/min): 18 Body Mass Index (BMI): 38.8 Blood Pressure (mmHg): 149/74 Reference Range: 80 - 120 mg / dl Electronic Signature(s) Signed: 03/14/2022 5:38:46 PM By: Massie Kluver Entered By: Massie Kluver on 03/14/2022 09:48:44

## 2022-03-28 ENCOUNTER — Encounter: Payer: 59 | Attending: Physician Assistant | Admitting: Internal Medicine

## 2022-03-28 DIAGNOSIS — L97512 Non-pressure chronic ulcer of other part of right foot with fat layer exposed: Secondary | ICD-10-CM | POA: Insufficient documentation

## 2022-03-28 DIAGNOSIS — E11621 Type 2 diabetes mellitus with foot ulcer: Secondary | ICD-10-CM | POA: Insufficient documentation

## 2022-03-28 DIAGNOSIS — E1143 Type 2 diabetes mellitus with diabetic autonomic (poly)neuropathy: Secondary | ICD-10-CM | POA: Diagnosis not present

## 2022-03-28 DIAGNOSIS — Z6838 Body mass index (BMI) 38.0-38.9, adult: Secondary | ICD-10-CM | POA: Insufficient documentation

## 2022-03-28 DIAGNOSIS — I10 Essential (primary) hypertension: Secondary | ICD-10-CM | POA: Diagnosis not present

## 2022-03-30 NOTE — Progress Notes (Signed)
MAVA, SUARES ([id number]) [id number] Page 1 of 8 Visit Report for 03/28/2022 Chief Complaint Document Details Patient Name: Date of Service: TA ONEDIA, VARGUS. 03/28/2022 9:45 A M Medical Record Number: [id number] Patient Account Number: [id number] Date of Birth/Sex: Treating RN: 1951/05/03 (71 y.o. Marlowe Shores Primary Care Provider: Denton Lank Other Clinician: Massie Kluver Referring Provider: Treating Provider/Extender: Eldridge Dace, MICHA EL Marella Chimes in Treatment: 19 Information Obtained from: Patient Chief Complaint Right Foot Ulcer Electronic Signature(s) Signed: 03/28/2022 4:14:14 PM By: Linton Ham MD Entered By: Linton Ham on 03/28/2022 10:54:14 -------------------------------------------------------------------------------- HPI Details Patient Name: Date of Service: TA [name], Finnleigh F. 03/28/2022 9:45 A M Medical Record Number: [id number] Patient Account Number: [id number] Date of Birth/Sex: Treating RN: 04-09-1951 (71 y.o. Marlowe Shores Primary Care Provider: Denton Lank Other Clinician: Massie Kluver Referring Provider: Treating Provider/Extender: Eldridge Dace, MICHA EL Marella Chimes in Treatment: 19 History of Present Illness HPI Description: this is a 71 year old morbidly obese patient who has uncontrolled diabetes and has not been taking treatment for diabetes for at least 6 months. She presents with bilateral heel ulcers of only 2 weeks duration. He was sent to [place] by her PCP Dr. Veda Canning. After this the patient was seen both by the podiatrist Dr. Caryl Comes and her vascular surgeon Dr. Delana Meyer. from what I understand from the son Dr. Delana Meyer has done some process on her and a procedure and says that he has finished working on her and no further vascular intervention is to be done. the patient was sent was for a further evaluation of her wounds. Her son has been doing some dressings at home on a twice a daily basis with  Silvadene ointment. Some recent blood work done on 07/19/2014 shows that her hemoglobin was 7.9 with a hematocrit of 25.7. Her white count was 12.7 and her glucose was 175 mg/dL. Her hemoglobin A1c was 8.4%. She has had no recent x-rays available for review. 07/31/2014. I was able to review the office visit notes from Dr. Delana Meyer on 07/14/2014. The patient's ABI on the right was 0.85 and on the left was 0.89 and both were abnormal. His assessment and plan was that the patient had severe atherosclerotic changes of both lower extremities associated with ulceration and tissue loss of the foot and this represent a limb threatening ischemia and he recommended a angiography of the lower extremities with hope for intervention for limb salvage. This was scheduled the procedure was done on 07/22/14 The surgery performed was an abdominal aortogram with the right lower extremity runoff and catheter placement. 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 DAYRIN, STALLONE ([id number]) [id number] Page 2 of 8 08/07/2014 x-rays were done of both feet on March 10. The right heel showed soft tissue wound, with some calcaneus lesion suspicious for osteomyelitis. An MRI would be recommended. the left heel did not show any evidence of osteomyelitis. 08/14/2014 -- Reports on Ivette T :app Cardiology reports reviewed from the [facility] clinic by Dr. Nehemiah Massed 2-D echocardiography done on 08/09/2013 showed that her normal LV systolic function with ejection fraction of 55% She also had mild mitral and tricuspid insufficiency. 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 Culture report of the bone shows several organisms which are grown and she has been put on Bactrim DS one twice a day for 14 days. The patient's bariatric surgeon Dr. Duke Salvia did speak with me today regarding the application of ACell and suggested that he would be happy to apply this in case the patient was ready for it. I had a detailed discussion with him regarding the patient's condition and I have recommended at the present time the patient would benefit from hyperbaric oxygen therapy, IV antibiotics and supportive wound care. if at some stage the wound was ready for application of a cell I would be happy to talk to him about taking over her care. He agreed with the treatment plan and said he would be in touch. Readmission: 11-15-2021 upon evaluation today patient presents for initial inspection here in our clinic concerning issues that she has been having with a wound on the plantar aspect of her right foot. I did review notes as well and it appears that she had a bone spur noted but no signs of osteomyelitis this was in May when she saw Dr. Caryl Comes. With that being said he apparently was recommended surgery to go in and remove the bone spur as he feels like that is the causative reason for the wound and indeed I feel like that may be the case as well based on what I am seeing. Fortunately I do not see any evidence of active infection locally or systemically right now although there is definitely some callus buildup and there is definitely more fluid trapped underneath that is hard to even tell which is what we are seeing currently working have to remove some of this callus in order to identify what is even going on underneath and how deep this really goes. The patient does have a history of diabetes mellitus type 2, diabetic neuropathy, and hypertension. Otherwise there are no major medical problems that would affect her  wound healing. Her most recent hemoglobin A1c was on 06-16-2021 and is stated to be good at 6.7 11-29-2021 upon evaluation today patient appears to be doing well currently in regard to her wound. She has been tolerating the dressing changes without complication. Fortunately there does not appear to be any evidence of active infection locally or systemically which is great news. No fevers, chills, nausea, vomiting, or diarrhea. I do think the Hydrofera Blue rope is doing decently well. With that being said she does have a little callus that builds up I am after cleaning this away as well as cleaning some of the base of the wound as well. She does have her MRI scheduled for later today. 12-20-2021 upon evaluation today patient appears to be doing well currently in regard to her heel ulcer. This is actually significantly improved compared to previous evaluations. I do think that we are on the right track here and we are seeing some definitive improvements. Overall I think that the patient is on the right track. I do not see any evidence of active infection systemically and locally I feel like this is also doing much better. She has been on doxycycline which I placed her on beginning 11-30-2021. This will be for 2 months and she has not yet completed the first month.Marland Kitchen 01-03-2022 upon evaluation today patient appears to be doing well currently in regard to her wound.  This is actually showing signs of excellent improvement is noted completely to the surface there is just a little bit of biofilm although we are can have to clear this away with sharp debridement as she is saline and gauze treatment is not enough to get this loosened today. 01-03-2022 upon evaluation today patient appears to be doing about the same in regard to her wound. The good news is she still on the antibiotics and nothing seems to be getting worse but notices that she is still not really showing a lot of improvement in the undermining area as  of yet. Not really what we will try to work on here is get some undermining to reattach them to family. T that end I think that we may need to be packing this a little bit more tightly with the o collagen and doing it dry. If we do this then it will start to moisten and resolve and it will not actually be over packed but hopefully should allow for more significant healing going forward. Patient voiced understanding and is in agreement with that plan. 01-17-2022 upon evaluation today patient appears to be doing about the same with regard to her wound. This is on the plantar aspect of the heel. With that being said it appears that this is not being packed as it should be. We needed to really be filling the undermining space with the collagen she had the dressing change this morning and that is definitely not what was there I think he might see some of the miscommunication in regards to the notes to home health. We will try to clarify that. 02-14-2022 upon evaluation today patient appears to be doing excellent in regard to her wound. She is actually tolerating the dressing changes without complication. Fortunately there does not appear to be any evidence of active infection locally or systemically at this time which is great news. No fevers, chills, nausea, vomiting, or diarrhea. I do believe that the collagen is doing a great job here. 02-28-2022 upon evaluation today patient appears to be doing well with regard to her wound although it may be that this was a little bit over packed with the collagen. We want it to be filling the space but not overly fill in the space. With that being said I think in general she seems to be making some good progress here though and I do believe the collagen has been much better for her than what we were doing previous with the [facility]. 03-14-2022 upon evaluation patient's wound is actually showing signs of good granulation and epithelization at this point.  Fortunately I think they were showing signs of significant improvement the undermining is better compared to what its been although we still do have some deeper areas especially toward the heel I do not feel any bone exposed which is good news. She is currently done with the doxycycline which I had her on for 2 months. We have seen dramatic improvement during that time and I am very pleased in that regard. With that being said she still does have some need for filling in of this undermining area and that is what we are using the collagen for at this point. 11/6; very difficult wound on the plantar right heel in this patient. She has an offloading walker but she says she only uses this to walk to go to the bathroom. Otherwise she is in her wheelchair at an assisted living facility. We have been using silver collagen Electronic  Signature(s) Signed: 03/28/2022 4:14:14 PM By: Linton Ham MD Entered By: Linton Ham on 03/28/2022 10:56:08 Bruso, Francis Gaines ([id number]) [id number] Page 3 of 8 -------------------------------------------------------------------------------- Physical Exam Details Patient Name: Date of Service: TA [name], Adonia F. 03/28/2022 9:45 A M Medical Record Number: [id number] Patient Account Number: [id number] Date of Birth/Sex: Treating RN: [birth year] (71 y.o. Marlowe Shores Primary Care Provider: Denton Lank Other Clinician: Massie Kluver Referring Provider: Treating Provider/Extender: RO BSO N, MICHA EL Marella Chimes in Treatment: 19 Constitutional Sitting or standing Blood Pressure is within target range for patient.. Pulse regular and within target range for patient.Marland Kitchen Respirations regular, non-labored and within target range.. Temperature is normal and within the target range for the patient.Marland Kitchen appears in no distress. Notes Wound exam; right heel small open area but with significant undermining towards the medial part of her foot  especially from 1-5 o'clock maximum of about 3:00. There is no evidence of infection Electronic Signature(s) Signed: 03/28/2022 4:14:14 PM By: Linton Ham MD Entered By: Linton Ham on 03/28/2022 10:58:41 -------------------------------------------------------------------------------- Physician Orders Details Patient Name: Date of Service: [name], Maddisyn F. 03/28/2022 9:45 A M Medical Record Number: [id number] Patient Account Number: [id number] Date of Birth/Sex: Treating RN: 06/30/1950 (71 y.o. [place]lowe Shores Primary Care Provider: Denton Lank Other Clinician: Massie Kluver Referring Provider: Treating Provider/Extender: RO BSO Delane Ginger, [place] EL Marella Chimes in Treatment: 20 Verbal / Phone Orders: No Diagnosis Coding Follow-up Appointments Return Appointment in 2 weeks. Nurse Visit as needed [place]: - [place] for wound care. May utilize formulary equivalent dressing for wound treatment orders unless otherwise specified. Home Health Nurse may visit PRN to address patients wound care needs. Scheduled days for dressing changes to be completed; exception, patient has scheduled wound care visit that day. **Please direct any NON-WOUND related issues/requests for orders to patient's Primary Care Physician. **If current dressing causes regression in wound condition, may D/C ordered dressing product/s and apply Normal Saline Moist Dressing daily until next [place] or Other MD appointment. **Notify [facility] of regression in wound condition at [phone]. Other Home Health Orders/Instructions: - Pack silver collagen lightly into wound undermining 12-12 2.5cm. PLEASE pack in prisma (silver collagen) DRY and DO NOT moisten as patient is having increased drainage Bathing/ Shower/ Hygiene Wash wounds with antibacterial soap and water. May shower; gently cleanse wound with antibacterial soap, rinse and pat dry prior  to dressing wounds Lacasse, Aidynn F ([id number]) [id number] Page 4 of 8 No tub bath. Off-Loading Other: - continue to wear crow walker boot to relieve pressure off wound Wound Treatment Wound #5 - Foot Wound Laterality: Plantar, Right Cleanser: Byram Ancillary Kit - 15 Day Supply 3 x Per Week/30 Days Discharge Instructions: Use supplies as instructed; Kit contains: (15) Saline Bullets; (15) 3x3 Gauze; 15 pr Gloves Cleanser: Soap and Water 3 x Per Week/30 Days Discharge Instructions: Gently cleanse wound with antibacterial soap, rinse and pat dry prior to dressing wounds Prim Dressing: Prisma 4.34 (in) (Home Health) 3 x Per Week/30 Days ary Discharge Instructions: PLEASE PACK DRY INTO WOUND (Undermining 12 oclock to 12 oclock , deepest point is at heel Secondary Dressing: (BORDER) Zetuvit Plus SILICONE BORDER Dressing 4x4 (in/in) (Home Health) 3 x Per Week/30 Days Discharge Instructions: Please do not put silicone bordered dressings under wraps. Use non-bordered dressing only. Electronic Signature(s) Signed: 03/28/2022 4:14:14 PM By: Linton Ham MD Signed: 03/29/2022 5:15:48 PM By: Massie Kluver Entered By: Massie Kluver on  03/28/2022 10:30:31 -------------------------------------------------------------------------------- Problem List Details Patient Name: Date of Service: TA MAJA, MCCAFFERY. 03/28/2022 9:45 A M Medical Record Number: [id number] Patient Account Number: [id number] Date of Birth/Sex: Treating RN: 1950-06-06 (71 y.o. 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 03/28/2022 9:45 A M Medical Record Number: [id number] Patient Account Number: [id number] Date of Birth/Sex: Treating RN: [birth year] (71 y.o. [place]lowe Shores Primary Care Provider: Denton Lank Other Clinician: Massie Kluver Referring Provider: Treating Provider/Extender: Eldridge Dace, MICHA EL Marella Chimes in Treatment: 19 Subjective Chief Complaint Information obtained from Patient Right Foot Ulcer History of Present Illness (HPI) this is a 71 year old morbidly obese patient who has uncontrolled diabetes and has not been taking treatment for diabetes for at least 6 months. She presents with bilateral heel ulcers of only 2 weeks duration. He was sent to [place] by her PCP Dr. Veda Canning. After this the patient was seen both by the podiatrist Dr. Caryl Comes and her vascular surgeon Dr. Delana Meyer. from what I understand from the son Dr. Delana Meyer has done some process on her and a procedure and says that he has finished working on her and no further vascular intervention is to be done. the patient was sent was for a further evaluation of her wounds. Her son has been doing some dressings at home on a twice a daily basis with Silvadene ointment. Some recent blood work done on 07/19/2014 shows that her hemoglobin was 7.9 with a hematocrit of 25.7. Her white count was 12.7 and her glucose was 175 mg/dL. Her hemoglobin A1c was 8.4%. She has had no recent x-rays available  for review. 07/31/2014. I was able to review the office visit notes from Dr. Delana Meyer on 07/14/2014. The patient's ABI on the right was 0.85 and on the left was 0.89 and both were abnormal. His assessment and plan was that the patient had severe atherosclerotic changes of both lower extremities associated with ulceration and tissue loss of the foot and this represent a limb threatening ischemia and he recommended a angiography of the lower extremities with hope for intervention for limb salvage. This was scheduled the procedure was done on 07/22/14 The surgery performed was an abdominal aortogram with the right lower extremity runoff and catheter placement. 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 08/07/2014 x-rays were done of both feet on March 10. The right heel showed soft tissue wound, with some calcaneus lesion suspicious for osteomyelitis. An MRI would be recommended. the left  heel did not show any evidence of osteomyelitis. 08/14/2014 -- Reports on Angeline T :app Cardiology reports reviewed from the [facility] clinic by Dr. Nehemiah Massed 2-D echocardiography done on 08/09/2013 showed that her normal LV systolic function with ejection fraction of 55% She also had mild mitral and tricuspid insufficiency. 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 Culture report of the bone shows several organisms which are grown and she has been put on Bactrim DS one twice a day for 14 days. The patient's bariatric surgeon Dr. Duke Salvia did speak with me today regarding the application of ACell and suggested that he  would be happy to apply this in case the patient was ready for it. I had a detailed discussion with him regarding the patient's condition and I have recommended at the present time the patient would benefit from hyperbaric oxygen therapy, IV antibiotics and supportive wound care. if at some stage the wound was ready for application of a cell I would be happy to talk to him about taking over her care. He agreed with the treatment plan and said he would be in touch. Readmission: 11-15-2021 upon evaluation today patient presents for initial inspection here in our clinic concerning issues that she has been having with a wound on the plantar aspect of her right foot. I did review notes as well and it appears that she had a bone spur noted but no signs of osteomyelitis this was in May when she saw Dr. Caryl Comes. With that being said he apparently was recommended surgery to go in and remove the bone spur as he feels like that is the causative reason for the wound and indeed I feel like that may be the case as well based on what I am seeing. Fortunately I do not see any evidence of active infection locally or systemically right now although there is definitely some callus buildup and there is definitely more fluid trapped underneath that is hard to even tell which is what we are seeing currently working have to remove some of this callus in order to identify what is even going on underneath and how deep this really goes. The patient does have a history of diabetes mellitus type 2, diabetic neuropathy, and hypertension. Otherwise there are no major medical problems that would affect her wound healing. Her most recent hemoglobin A1c was on 06-16-2021 and is stated to be good at 6.7 11-29-2021 upon evaluation today patient appears to be doing well currently in regard to her wound. She has been tolerating the dressing changes without complication. Fortunately there does not appear to be any evidence of active infection  locally or systemically which is great news. No fevers, chills, nausea, vomiting, or diarrhea. I do think the Hydrofera Blue rope is doing decently well. With that being said she does have a little callus that builds up I am after LIS, SAVITT ([id number]) [id number] Page 6 of 8 cleaning this away as well as cleaning some of the base of the wound as well. She does have her MRI scheduled for later today. 12-20-2021 upon evaluation today patient appears to be doing well currently in regard to her heel ulcer. This is actually significantly improved compared to previous evaluations. I do think that we are on the right track here and we are seeing some definitive improvements. Overall I think that the patient is on the right track. I do not  see any evidence of active infection systemically and locally I feel like this is also doing much better. She has been on doxycycline which I placed her on beginning 11-30-2021. This will be for 2 months and she has not yet completed the first month.Marland Kitchen 01-03-2022 upon evaluation today patient appears to be doing well currently in regard to her wound. This is actually showing signs of excellent improvement is noted completely to the surface there is just a little bit of biofilm although we are can have to clear this away with sharp debridement as she is saline and gauze treatment is not enough to get this loosened today. 01-03-2022 upon evaluation today patient appears to be doing about the same in regard to her wound. The good news is she still on the antibiotics and nothing seems to be getting worse but notices that she is still not really showing a lot of improvement in the undermining area as of yet. Not really what we will try to work on here is get some undermining to reattach them to family. T that end I think that we may need to be packing this a little bit more tightly with the o collagen and doing it dry. If we do this then it will  start to moisten and resolve and it will not actually be over packed but hopefully should allow for more significant healing going forward. Patient voiced understanding and is in agreement with that plan. 01-17-2022 upon evaluation today patient appears to be doing about the same with regard to her wound. This is on the plantar aspect of the heel. With that being said it appears that this is not being packed as it should be. We needed to really be filling the undermining space with the collagen she had the dressing change this morning and that is definitely not what was there I think he might see some of the miscommunication in regards to the notes to home health. We will try to clarify that. 02-14-2022 upon evaluation today patient appears to be doing excellent in regard to her wound. She is actually tolerating the dressing changes without complication. Fortunately there does not appear to be any evidence of active infection locally or systemically at this time which is great news. No fevers, chills, nausea, vomiting, or diarrhea. I do believe that the collagen is doing a great job here. 02-28-2022 upon evaluation today patient appears to be doing well with regard to her wound although it may be that this was a little bit over packed with the collagen. We want it to be filling the space but not overly fill in the space. With that being said I think in general she seems to be making some good progress here though and I do believe the collagen has been much better for her than what we were doing previous with the [facility]. 03-14-2022 upon evaluation patient's wound is actually showing signs of good granulation and epithelization at this point. Fortunately I think they were showing signs of significant improvement the undermining is better compared to what its been although we still do have some deeper areas especially toward the heel I do not feel any bone exposed which is good news. She is currently  done with the doxycycline which I had her on for 2 months. We have seen dramatic improvement during that time and I am very pleased in that regard. With that being said she still does have some need for filling in of this undermining area and  that is what we are using the collagen for at this point. 11/6; very difficult wound on the plantar right heel in this patient. She has an offloading walker but she says she only uses this to walk to go to the bathroom. Otherwise she is in her wheelchair at an assisted living facility. We have been using silver collagen Objective Constitutional Sitting or standing Blood Pressure is within target range for patient.. Pulse regular and within target range for patient.Marland Kitchen Respirations regular, non-labored and within target range.. Temperature is normal and within the target range for the patient.Marland Kitchen appears in no distress. Vitals Time Taken: 9:45 AM, Height: 67 in, Weight: 248 lbs, BMI: 38.8, Temperature: 98.2 F, Pulse: 82 bpm, Respiratory Rate: 16 breaths/min, Blood Pressure: 128/79 mmHg. General Notes: Wound exam; right heel small open area but with significant undermining towards the medial part of her foot especially from 1-5 o'clock maximum of about 3:00. There is no evidence of infection Integumentary (Hair, Skin) Wound #5 status is Open. Original cause of wound was Pressure Injury. The date acquired was: 06/23/2021. The wound has been in treatment 19 weeks. The wound is located on the [place]. The wound measures 0.5cm length x 0.6cm width x 0.4cm depth; 0.236cm^2 area and 0.094cm^3 volume. There is Fat Layer (Subcutaneous Tissue) exposed. Tunneling has been noted at 2:00 with a maximum distance of 5cm. Undermining begins at 12:00 and ends at 12:00 with a maximum distance of 0.6cm. There is a medium amount of serosanguineous drainage noted. The wound margin is well defined and not attached to the wound base. There is large (67-100%) red, pale  granulation within the wound bed. There is a small (1-33%) amount of necrotic tissue within the wound bed including Adherent Slough. General Notes: Late entry to correct staging. 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 Nurse Visit as needed Home Health: [facility]: [place] for wound care. May utilize formulary equivalent dressing for wound treatment orders unless otherwise specified. Home Health Nurse may visit PRN to address patientoos wound care needs. Scheduled days for dressing changes to be completed; exception, patient has scheduled wound care visit that day. **Please direct any NON-WOUND related issues/requests for orders to patient's Primary Care Physician. **If current dressing causes regression in wound condition, may D/C ordered dressing product/s and apply Normal Saline Moist Dressing daily until next [place] or Other MD appointment. **Notify [facility] of regression in wound condition at [phone]. Other Home Health Orders/Instructions: - Pack silver collagen lightly into wound undermining 12-12 2.5cm. PLEASE pack in prisma (silver collagen) DRY and DO NOT moisten as patient is having increased drainage Bathing/ Shower/ Hygiene: Wash wounds with antibacterial soap and water. May shower; gently cleanse wound with antibacterial soap, rinse and pat dry prior to dressing wounds No tub bath. Off-Loading: Other: - continue to wear crow walker boot to relieve pressure off wound WOUND #5: - Foot Wound Laterality: Plantar, Right Cleanser: [place] Ancillary Kit - 15 Day Supply 3 x  Per Week/30 Days Discharge Instructions: Use supplies as instructed; Kit contains: (15) Saline Bullets; (15) 3x3 Gauze; 15 pr Gloves Cleanser: Soap and Water 3 x Per Week/30 Days Discharge Instructions: Gently cleanse wound with antibacterial soap, rinse  and pat dry prior to dressing wounds Prim Dressing: Prisma 4.34 (in) ([place]) 3 x Per Week/30 Days ary Discharge Instructions: PLEASE PACK DRY INTO WOUND (Undermining 12 oclock to 12 oclock , deepest point is at heel Secondary Dressing: (BORDER) Zetuvit Plus SILICONE BORDER Dressing 4x4 (in/in) ([place]) 3 x Per Week/30 Days Discharge Instructions: Please do not put silicone bordered dressings under wraps. Use non-bordered dressing only. 1 problematic wound on the remanent of this patient's plantar right calcaneus. 2. The wound is small and generally looks fairly healthy however there is significant undermining going towards the medial part of the foot 3. No evidence of infection 4. We are packing this lightly with Prisma. Continuing with her orthopedic walking boot although I am not sure that that sufficient. 5. The only other option here would be to unroofed the undermining area if this does not progress towards closure and may be more aggressively offload this perhaps in a total contact cast. She is in an assisted living, she could get assistance with a minimal amount that the patient says she ambulates Electronic Signature(s) Signed: 03/28/2022 3:26:20 PM By: Linton Ham MD Entered By: Linton Ham on 03/28/2022 15:26:20 -------------------------------------------------------------------------------- SuperBill Details Patient Name: Date of Service: TA [name], Ellison F. 03/28/2022 Medical Record Number: [id number] Patient Account Number: [id number] Date of Birth/Sex: Treating RN: [birth year] (71 y.o. [place] Primary Care Provider: Denton Lank Other Clinician: Massie Kluver Referring Provider: Treating Provider/Extender:  Eldridge Dace, MICHA EL Marella Chimes in Treatment: 19 Diagnosis Coding ICD-10 Codes Code Description E11.621 Type 2 diabetes mellitus with foot ulcer L97.512 Non-pressure chronic ulcer of other part of right foot with fat layer exposed E11.43 Type 2 diabetes mellitus with diabetic autonomic (poly)neuropathy I10 Essential (primary) hypertension Facility Procedures : SYERRA, ABDELRAHMAN Code: [id number] NDA F ([id number]) Description: 99213 - WOUND CARE VISIT-LEV 3 EST PT [phone] Modifier: [id number] Quantity: 1 17.pdf Page 8 of 8 Physician Procedures : CPT4 Code Description Modifier [id number] 11021 - WC PHYS LEVEL 3 - EST PT ICD-10 Diagnosis Description E11.621 Type 2 diabetes mellitus with foot ulcer L97.512 Non-pressure chronic ulcer of other part of right foot with fat layer exposed Quantity: 1 Electronic Signature(s) Signed: 03/28/2022 4:14:14 PM By: Linton Ham MD Entered By: Linton Ham on 03/28/2022 11:00:33

## 2022-03-30 NOTE — Progress Notes (Signed)
[name], [name] ([id number]) [id number] Page 1 of 9 Visit Report for 03/28/2022 Arrival Information Details Patient Name: Date of Service: TA [place]. 03/28/2022 9:45 A M Medical Record Number: [id number] Patient Account Number: [id number] Date of Birth/Sex: Treating RN: April 02, 1951 (71 y.o. [name], [name] Primary Care Sanders Manninen: Denton Lank Other Clinician: Massie Kluver Referring Emaya Preston: Treating Paradise Vensel/Extender: Eldridge Dace, [place] EL Marella Chimes in Treatment: 19 Visit Information History Since Last Visit All ordered tests and consults were completed: No Patient Arrived: Wheel Chair Added or deleted any medications: No Arrival Time: 09:45 Any new allergies or adverse reactions: No Transfer Assistance: EasyPivot Patient Lift Had a fall or experienced change in No Patient Requires Transmission-Based No activities of daily living that may affect Precautions: risk of falls: Patient Has Alerts: Yes Hospitalized since last visit: No Patient Alerts: Patient on Blood Thinner Pain Present Now: No ABI 11/15/21 L 1.17 R 1.27 Electronic Signature(s) Signed: 03/29/2022 5:15:48 PM By: Massie Kluver Entered By: Massie Kluver on 03/28/2022 09:45:32 -------------------------------------------------------------------------------- Clinic Level of Care Assessment Details Patient Name: Date of Service: TA [name], Alessia F. 03/28/2022 9:45 A M Medical Record Number: [id number] Patient Account Number: [id number] Date of Birth/Sex: Treating RN: August 15, 1950 (71 y.o. Mar[place]e [place]ores Primary Care Maxon Kresse: Denton Lank Other Clinician: Massie Kluver Referring Levi Crass: Treating Gwynn Crossley/Extender: Eldridge Dace, MICHA EL Marella Chimes in Treatment: 19 Clinic Level of Care Assessment Items TOOL 4 Quantity Score _0  - 0 Use when only an EandM is performed on FOLLOW-UP visit ASSESSMENTS - Nursing Assessment / Reassessment X- 1 10 Reassessment of Co-morbidities  (includes updates in patient status) X- 1 5 Reassessment of Adherence to Treatment Plan ASSESSMENTS - Wound and Skin A ssessment / Reassessment X - Simple Wound Assessment / Reassessment - one wound 1 5 _1  - 0 Complex Wound Assessment / Reassessment - multiple wounds Hubert, Hyla F ([id number]) [id number] Page 2 of 9 _2  - 0 Dermatologic / Skin Assessment (not related to wound area) ASSESSMENTS - Focused Assessment _3  - 0 Circumferential Edema Measurements - multi extremities _4  - 0 Nutritional Assessment / Counseling / Intervention _5  - 0 Lower Extremity Assessment (monofilament, tuning fork, pulses) _6  - 0 Peripheral Arterial Disease Assessment (using hand held doppler) ASSESSMENTS - Ostomy and/or Continence Assessment and Care _7  - 0 Incontinence Assessment and Management _8  - 0 Ostomy Care Assessment and Management (repouching, etc.) PROCESS - Coordination of Care X - Simple Patient / Family Education for ongoing care 1 15 _9  - 0 Complex (extensive) Patient / Family Education for ongoing care _10  - 0 Staff obtains Programmer, systems, Records, T Results / Process Orders est _11  - 0 Staff telephones HHA, Nursing Homes / Clarify orders / etc _12  - 0 Routine Transfer to another Facility (non-emergent condition) _13  - 0 Routine Hospital Admission (non-emergent condition) _14  - 0 New Admissions / Biomedical engineer / Ordering NPWT Apligraf, etc. , _15  - 0 Emergency Hospital Admission (emergent condition) X- 1 10 Simple Discharge Coordination _16  - 0 Complex (extensive) Discharge Coordination PROCESS - Special Needs _17  - 0 Pediatric / Minor Patient Management _18  - 0 Isolation Patient Management _19  - 0 Hearing / Language / Visual special needs _20  - 0 Assessment of Community assistance (transportation, D/C planning, etc.) _21  - 0 Additional assistance / Altered mentation _22  - 0 Support Surface(s) Assessment (bed, cushion, seat, etc.) INTERVENTIONS -  Wound Cleansing / Measurement X - Simple Wound Cleansing - one wound 1 5 _23  - 0 Complex Wound Cleansing -  multiple wounds X- 1 5 Wound Imaging (photographs - any number of wounds) _0  - 0 Wound Tracing (instead of photographs) X- 1 5 Simple Wound Measurement - one wound _1  - 0 Complex Wound Measurement - multiple wounds INTERVENTIONS - Wound Dressings _2  - 0 Small Wound Dressing one or multiple wounds X- 1 15 Medium Wound Dressing one or multiple wounds _3  - 0 Large Wound Dressing one or multiple wounds <DGUYQIHKVQQVZDGL>_8<\/VFIEPPIRJJOACZYS>_0  - 0 Application of Medications - topical <YTKZSWFUXNATFTDD>_2<\/KGURKYHCWCBJSEGB>_1  - 0 Application of Medications - injection INTERVENTIONS - Miscellaneous _6  - 0 External ear exam _7  - 0 Specimen Collection (cultures, biopsies, blood, body fluids, etc.) _8  - 0 Specimen(s) / Culture(s) sent or taken to Lab for analysis Minchey, Mellonie F ([id number]) [id number] Page 3 of 9 _9  - 0 Patient Transfer (multiple staff / Civil Service fast streamer / Similar devices) _10  - 0 Simple Staple / Suture removal (25 or less) _11  - 0 Complex Staple / Suture removal (26 or more) _12  - 0 Hypo / Hyperglycemic Management (close monitor of Blood Glucose) _13  - 0 Ankle / Brachial Index (ABI) - do not check if billed separately X- 1 5 Vital Signs Has the patient been seen at the hospital within the last three years: Yes Total Score: 80 Level Of Care: New/Established - Level 3 Electronic Signature(s) Signed: 03/29/2022 5:15:48 PM By: Massie Kluver Entered By: Massie Kluver on 03/28/2022 10:31:04 -------------------------------------------------------------------------------- Encounter Discharge Information Details Patient Name: Date of Service: TA [name], Reighlyn F. 03/28/2022 9:45 A M Medical Record Number: [id number] Patient Account Number: [id number] Date of Birth/Sex: Treating RN: 1951/03/14 (71 y.o. Mar[place]e Sh[place] Primary Care Royalti Schauf: Denton Lank Other Clinician: Massie Kluver Referring Alle Difabio: Treating  Jim Philemon/Extender: Eldridge Dace, MICHA EL Marella Chimes in Treatment: 19 Encounter Discharge Information Items Discharge Condition: Stable Ambulatory Status: Wheelchair Discharge Destination: Skilled Nursing Facility Telephoned: Yes Orders Sent: Yes Transportation: Other Accompanied By: self Schedule Follow-up Appointment: Yes Clinical Summary of Care: Electronic Signature(s) Signed: 03/29/2022 5:15:48 PM By: Massie Kluver Entered By: Massie Kluver on 03/28/2022 10:44:08 -------------------------------------------------------------------------------- Lower Extremity Assessment Details Patient Name: Date of Service: [name], Pauline F. 03/28/2022 9:45 A M Medical Record Number: [id number] Patient Account Number: [id number] Date of Birth/Sex: Treating RN: 1951/05/20 (71 y.o. [address], [address] Gottwald, Inez F ([id number]) [id number] Page 4 of 9 Primary Care Jakeira Seeman: Denton Lank Other Clinician: Massie Kluver Referring Zavia Pullen: Treating Shaquill Iseman/Extender: Eldridge Dace, MICHA EL Marella Chimes in Treatment: 19 Electronic Signature(s) Signed: 03/29/2022 8:23:09 AM By: Gretta Cool BSN, RN, CWS, [name] RN, BSN Signed: 03/29/2022 5:15:48 PM By: Massie Kluver Entered By: Massie Kluver on 03/28/2022 09:59:57 -------------------------------------------------------------------------------- Multi Wound Chart Details Patient Name: Date of Service: [name], Lyrick F. 03/28/2022 9:45 A M Medical Record Number: [id number] Patient Account Number: [id number] Date of Birth/Sex: Treating RN: Sep 21, 1950 (71 y.o. [place]e [place] Primary Care Otilio Groleau: Denton Lank Other Clinician: Massie Kluver Referring Raekwan Spelman: Treating Nasim Habeeb/Extender: Eldridge Dace, [place] EL Marella Chimes in Treatment: 19 Vital Signs Height(in): 85 Pulse(bpm): 51 Weight(lbs): 248 Blood Pressure(mmHg): 128/79 Body Mass Index(BMI): 38.8 Temperature(F): 98.2 Respiratory Rate(breaths/min):  16 [5:Photos:] [N/A:N/A] Right, Plantar Foot N/A N/A Wound Location: Pressure Injury N/A N/A Wounding Event: Diabetic Wound/Ulcer of the Lower N/A N/A Primary Etiology: Extremity Cataracts, Anemia, Hypertension, N/A N/A Comorbid History: Type II Diabetes, Osteoarthritis, Osteomyelitis, Neuropathy, Confinement Anxiety 06/23/2021 N/A N/A Date Acquired: 31 N/A N/A Weeks of Treatment: Open N/A N/A Wound Status: No N/A N/A Wound Recurrence: 0.5x0.6x0.4 N/A N/A Measurements L x W  x D (cm) 0.236 N/A N/A A (cm) : rea 0.094 N/A N/A Volume (cm) : -232.40% N/A N/A % Reduction in A rea: -571.40% N/A N/A % Reduction in Volume: 2 Position 1 (o'clock): 5 Maximum Distance 1 (cm): 12 Starting Position 1 (o'clock): 12 Ending Position 1 (o'clock): 0.6 Maximum Distance 1 (cm): Yes N/A N/A Tunneling: Yes N/A N/A Undermining: Grade 2 N/A N/A Classification: Medium N/A N/A Exudate A mount: Serosanguineous N/A N/A Exudate Type: Hiser, Kissie F ([id number]) [id number] Page 5 of 9 red, brown N/A N/A Exudate Color: Well defined, not attached N/A N/A Wound Margin: Large (67-100%) N/A N/A Granulation A mount: Red, Pale N/A N/A Granulation Quality: Small (1-33%) N/A N/A Necrotic A mount: Fat Layer (Subcutaneous Tissue): Yes N/A N/A Exposed Structures: None N/A N/A Epithelialization: Treatment Notes Electronic Signature(s) Signed: 03/29/2022 5:15:48 PM By: Massie Kluver Entered By: Massie Kluver on 03/28/2022 10:01:15 -------------------------------------------------------------------------------- Fletcher Details Patient Name: Date of Service: TA [name], Britt F. 03/28/2022 9:45 A M Medical Record Number: [id number] Patient Account Number: [id number] Date of Birth/Sex: Treating RN: May 02, 1951 (71 y.o. [name], [name] Primary Care Emonee Winkowski: Denton Lank Other Clinician: Massie Kluver Referring Jarica Plass: Treating  Anaiya Wisinski/Extender: Eldridge Dace, MICHA EL Marella Chimes in Treatment: 19 Active Inactive Wound/Skin Impairment Nursing Diagnoses: Impaired tissue integrity Knowledge deficit related to ulceration/compromised skin integrity Goals: Ulcer/skin breakdown will have a volume reduction of 30% by week 4 Date Initiated: 11/15/2021 Date Inactivated: 01/17/2022 Target Resolution Date: 12/13/2021 Goal Status: Unmet Unmet Reason: comorbities Ulcer/skin breakdown will have a volume reduction of 50% by week 8 Date Initiated: 11/15/2021 Date Inactivated: 01/17/2022 Target Resolution Date: 01/10/2022 Goal Status: Unmet Unmet Reason: comorbities Ulcer/skin breakdown will have a volume reduction of 80% by week 12 Date Initiated: 11/15/2021 Target Resolution Date: 02/07/2022 Goal Status: Active Ulcer/skin breakdown will heal within 14 weeks Date Initiated: 11/15/2021 Target Resolution Date: 02/21/2022 Goal Status: Active Interventions: Assess patient/caregiver ability to obtain necessary supplies Assess patient/caregiver ability to perform ulcer/skin care regimen upon admission and as needed Assess ulceration(s) every visit Provide education on ulcer and skin care Notes: Electronic Signature(s) Signed: 03/29/2022 8:23:09 AM By: Gretta Cool, BSN, RN, CWS, [name] RN, BSN Signed: 03/29/2022 5:15:48 PM By: Massie Kluver Entered By: Massie Kluver on 03/28/2022 10:00:01 Dial, Jolena F ([id number]) [id number] Page 6 of 9 -------------------------------------------------------------------------------- Pain Assessment Details Patient Name: Date of Service: TA [place]. 03/28/2022 9:45 A M Medical Record Number: [id number] Patient Account Number: [id number] Date of Birth/Sex: Treating RN: [birth year] (70 y.o. [place]e [place] Primary Care Aimy Sweeting: Denton Lank Other Clinician: Massie Kluver Referring Cincere Zorn: Treating Brylee Berk/Extender: Eldridge Dace, MICHA EL Marella Chimes in  Treatment: 19 Active Problems Location of Pain Severity and Description of Pain Patient Has Paino No Site Locations Pain Management and Medication Current Pain Management: Electronic Signature(s) Signed: 03/29/2022 8:23:09 AM By: Gretta Cool, BSN, RN, CWS, [name] RN, BSN Signed: 03/29/2022 5:15:48 PM By: Massie Kluver Entered By: Massie Kluver on 03/28/2022 09:51:02 -------------------------------------------------------------------------------- Patient/Caregiver Education Details Patient Name: Date of Service: TA [name], 53 F. 11/6/2023andnbsp9:45 A M Medical Record Number: [id number] Patient Account Number: [id number] Date of Birth/Gender: Treating RN: 03-Feb-1951 (71 y.o. [place]e [place] Primary Care Physician: Denton Lank Other Clinician: Massie Kluver Referring Physician: Treating Physician/Extender: [address] MICHA EL Aluna, [place] ([id number]) [phone].pdf Page 7 of 9 Weeks in Treatment: 19 Education Assessment Education Provided To: Patient Education Topics Provided Wound/Skin Impairment: Handouts: Other: continue wound care as directed Methods: Explain/Verbal  Responses: State content correctly Electronic Signature(s) Signed: 03/29/2022 5:15:48 PM By: Massie Kluver Entered By: Massie Kluver on 03/28/2022 10:31:46 -------------------------------------------------------------------------------- Wound Assessment Details Patient Name: Date of Service: [name], Lashay F. 03/28/2022 9:45 A M Medical Record Number: [id number] Patient Account Number: [id number] Date of Birth/Sex: Treating RN: May 02, 1951 (71 y.o. [name], [name] Primary Care Ayasha Ellingsen: Denton Lank Other Clinician: Massie Kluver Referring Kaiulani Sitton: Treating Madeleine Fenn/Extender: Eldridge Dace, [place] EL Marella Chimes in Treatment: 19 Wound Status Wound Number: 5 Primary Diabetic Wound/Ulcer of the Lower Extremity Etiology: Wound Location: Right, Plantar Foot Wound  Open Wounding Event: Pressure Injury Status: Date Acquired: 06/23/2021 Comorbid Cataracts, Anemia, Hypertension, Type II Diabetes, Osteoarthritis, Weeks Of Treatment: 19 History: Osteomyelitis, Neuropathy, Confinement Anxiety Clustered Wound: No Photos Wound Measurements Length: (cm) 0.5 Width: (cm) 0.6 Depth: (cm) 0.4 Area: (cm) 0.236 Volume: (cm) 0.094 % Reduction in Area: -232.4% % Reduction in Volume: -571.4% Epithelialization: None Tunneling: Yes Position (o'clock): 2 Maximum Distance: (cm) 5 Undermining: Yes Starting Position (o'clock): 12 Rosengrant, Fayne F ([id number]) [id number] Page 8 of 9 Ending Position (o'clock): 12 Maximum Distance: (cm) 0.6 Wound Description Classification: Grade 3 Wagner Verification: MRI Wound Margin: Well defined, not attached Exudate Amount: Medium Exudate Type: Serosanguineous Exudate Color: red, brown Foul Odor After Cleansing: No Slough/Fibrino No Wound Bed Granulation Amount: Large (67-100%) Exposed Structure Granulation Quality: Red, Pale Fat Layer (Subcutaneous Tissue) Exposed: Yes Necrotic Amount: Small (1-33%) Necrotic Quality: Adherent Slough Assessment Notes Late entry to correct staging. Treatment Notes Wound #5 (Foot) Wound Laterality: Plantar, Right Cleanser Byram Ancillary Kit - 15 Day Supply Discharge Instruction: Use supplies as instructed; Kit contains: (15) Saline Bullets; (15) 3x3 Gauze; 15 pr Gloves Soap and Water Discharge Instruction: Gently cleanse wound with antibacterial soap, rinse and pat dry prior to dressing wounds Peri-Wound Care Topical Primary Dressing Prisma 4.34 (in) Discharge Instruction: [place] (Undermining 12 oclock to 12 oclock , deepest point is at heel Secondary Dressing (BORDER) Zetuvit Plus SILICONE BORDER Dressing 4x4 (in/in) Discharge Instruction: Please do not put silicone bordered dressings under wraps. Use non-bordered dressing  only. Secured With Compression Wrap Compression Stockings Environmental education officer) Signed: 03/28/2022 3:21:31 PM By: Gretta Cool, BSN, RN, CWS, [name] RN, BSN Entered By: Gretta Cool, BSN, RN, CWS, [name] on 03/28/2022 15:21:31 -------------------------------------------------------------------------------- Vitals Details Patient Name: Date of Service: [name], Johana F. 03/28/2022 9:45 A M Medical Record Number: [id number] Patient Account Number: [id number] Date of Birth/Sex: Treating RN: 18-Oct-1950 (71 y.o. [place]e [place] Primary Care Jahmia Berrett: Denton Lank Other Clinician: Massie Kluver Referring Bryson Palen: Treating Tyler Robidoux/Extender: [address] MICHA EL Janyth, [place] ([id number]) [id number] Page 9 of 9 Weeks in Treatment: 19 Vital Signs Time Taken: 09:45 Temperature (F): 98.2 Height (in): 67 Pulse (bpm): 82 Weight (lbs): 248 Respiratory Rate (breaths/min): 16 Body Mass Index (BMI): 38.8 Blood Pressure (mmHg): 128/79 Reference Range: 80 - 120 mg / dl Electronic Signature(s) Signed: 03/29/2022 5:15:48 PM By: Massie Kluver Entered By: Massie Kluver on 03/28/2022 09:50:54

## 2022-04-11 ENCOUNTER — Ambulatory Visit: Payer: 59 | Admitting: Physician Assistant

## 2022-04-18 ENCOUNTER — Encounter: Payer: 59 | Admitting: Physician Assistant

## 2022-04-18 DIAGNOSIS — E11621 Type 2 diabetes mellitus with foot ulcer: Secondary | ICD-10-CM | POA: Diagnosis not present

## 2022-04-18 NOTE — Progress Notes (Signed)
[name], [name] ([id number]) [id number] Page 1 of 8 Visit Report for 04/18/2022 Arrival Information Details Patient Name: Date of Service: [name], [name] 04/18/2022 12:45 PM Medical Record Number: [id number] Patient Account Number: [id number] Date of Birth/Sex: Treating RN: 1950-08-26 (71 y.o. Orvan Falconer Primary Care Clella Mckeel: Denton Lank Other Clinician: Massie Kluver Referring Diego Delancey: Treating Karanvir Balderston/Extender: Almedia Balls in Treatment: 47 Visit Information History Since Last Visit All ordered tests and consults were completed: No Patient Arrived: Wheel Chair Added or deleted any medications: No Arrival Time: 12:54 Any new allergies or adverse reactions: No Transfer Assistance: EasyPivot Patient Lift Had a fall or experienced change in No Patient Identification Verified: Yes activities of daily living that may affect Secondary Verification Process Completed: Yes risk of falls: Patient Requires Transmission-Based No Signs or symptoms of abuse/neglect since last No Precautions: visito Patient Has Alerts: Yes Hospitalized since last visit: No Patient Alerts: Patient on Blood Thinner Implantable device outside of the clinic excluding No ABI 11/15/21 L 1.17 R cellular tissue based products placed in the center 1.27 since last visit: Has Dressing in Place as Prescribed: Yes Has Footwear/Offloading in Place as Prescribed: Yes Left: Other:offloading ortho walker boot Pain Present Now: No Electronic Signature(s) Signed: 04/18/2022 5:11:01 PM By: Massie Kluver Entered By: Massie Kluver on 04/18/2022 12:55:08 -------------------------------------------------------------------------------- Clinic Level of Care Assessment Details Patient Name: Date of Service: Daisy [place]. 04/18/2022 12:45 PM Medical Record Number: [id number] Patient Account Number: [id number] Date of Birth/Sex: Treating RN: 1951/01/10 (71 y.o. Orvan Falconer Primary Care Areyanna Figeroa: Denton Lank Other Clinician: Massie Kluver Referring Keeli Roberg: Treating Nysia Dell/Extender: Almedia Balls in Treatment: 22 Clinic Level of Care Assessment Items TOOL 1 Quantity Score _0  - 0 Use when EandM and Procedure is performed on INITIAL visit ASSESSMENTS - Nursing Assessment / Reassessment [name], [name] ([id number]) [id number] Page 2 of 8 _1  - 0 General Physical Exam (combine w/ comprehensive assessment (listed just below) when performed on new pt. evals) _2  - 0 Comprehensive Assessment (HX, ROS, Risk Assessments, Wounds Hx, etc.) ASSESSMENTS - Wound and Skin Assessment / Reassessment _3  - 0 Dermatologic / Skin Assessment (not related to wound area) ASSESSMENTS - Ostomy and/or Continence Assessment and Care _4  - 0 Incontinence Assessment and Management _5  - 0 Ostomy Care Assessment and Management (repouching, etc.) PROCESS - Coordination of Care _6  - 0 Simple Patient / Family Education for ongoing care _7  - 0 Complex (extensive) Patient / Family Education for ongoing care _8  - 0 Staff obtains Programmer, systems, Records, T Results / Process Orders est _9  - 0 Staff telephones HHA, Nursing Homes / Clarify orders / etc _10  - 0 Routine Transfer to another Facility (non-emergent condition) _11  - 0 Routine Hospital Admission (non-emergent condition) _12  - 0 New Admissions / Biomedical engineer / Ordering NPWT Apligraf, etc. , _13  - 0 Emergency Hospital Admission (emergent condition) PROCESS - Special Needs _14  - 0 Pediatric / Minor Patient Management _15  - 0 Isolation Patient Management _16  - 0 Hearing / Language / Visual special needs _17  - 0 Assessment of Community assistance (transportation, D/C planning, etc.) _18  - 0 Additional assistance / Altered mentation _19  - 0 Support Surface(s) Assessment (bed, cushion, seat, etc.) INTERVENTIONS - Miscellaneous _20  - 0 External ear exam _21  - 0 Patient  Transfer (multiple staff / Civil Service fast streamer / Similar devices) _22  - 0 Simple Staple / Suture removal (25 or less) _23  - 0 Complex Staple / Suture removal (26 or more) _24  - 0  Hypo/Hyperglycemic Management (do not check if billed separately) _0  - 0 Ankle / Brachial Index (ABI) - do not check if billed separately Has the patient been seen at the hospital within the last three years: Yes Total Score: 0 Level Of Care: ____ Electronic Signature(s) Signed: 04/18/2022 5:11:01 PM By: Massie Kluver Entered By: Massie Kluver on 04/18/2022 13:20:24 -------------------------------------------------------------------------------- Encounter Discharge Information Details Patient Name: Date of Service: [name], [name]. 04/18/2022 12:45 PM Medical Record Number: [id number] Patient Account Number: [id number] [name], [name] ([id number]) [id number] Page 3 of 8 Date of Birth/Sex: Treating RN: 05/07/1951 (71 y.o. Orvan Falconer Primary Care Ludwig Tugwell: Other Clinician: Leane Platt, Janace Hoard Referring Lazaria Schaben: Treating Braelee Herrle/Extender: Almedia Balls in Treatment: 22 Encounter Discharge Information Items Post Procedure Vitals Discharge Condition: Stable Temperature ([name]): 98.1 Ambulatory Status: Wheelchair Pulse (bpm): 73 Discharge Destination: Home Respiratory Rate (breaths/min): 16 Transportation: Other Blood Pressure (mmHg): 126/74 Accompanied By: self Schedule Follow-up Appointment: Yes Clinical Summary of Care: Electronic Signature(s) Signed: 04/18/2022 5:11:01 PM By: Massie Kluver Entered By: Massie Kluver on 04/18/2022 14:05:43 -------------------------------------------------------------------------------- Lower Extremity Assessment Details Patient Name: Date of Service: [name], RANE. 04/18/2022 12:45 PM Medical Record Number: [id number] Patient Account Number: [id number] Date of Birth/Sex: Treating RN: [birth year] (71 y.o. Orvan Falconer Primary Care Kearsten Ginther: Denton Lank Other Clinician: Massie Kluver Referring Terrionna Bridwell: Treating Alese Furniss/Extender: Almedia Balls in Treatment: 22 Electronic Signature(s) Signed: 04/18/2022 5:11:01 PM By: Massie Kluver Signed: 04/19/2022 4:00:23 PM By: Carlene Coria RN Entered By: Massie Kluver on 04/18/2022 13:07:32 -------------------------------------------------------------------------------- Multi Wound Chart Details Patient Name: Date of Service: Daisy [name], [name]. 04/18/2022 12:45 PM Medical Record Number: [id number] Patient Account Number: [id number] Date of Birth/Sex: Treating RN: 10/11/1950 (71 y.o. Orvan Falconer Primary Care Nancie Bocanegra: Denton Lank Other Clinician: Massie Kluver Referring Mariaguadalupe Fialkowski: Treating Talayia Hjort/Extender: Almedia Balls in Treatment: 22 Vital Signs Height(in): 67 Pulse(bpm): 73 Weight(lbs): 248 Blood Pressure(mmHg): 126/73 Body Mass Index(BMI): 38.8 Temperature([name]): 98.1 Hession, Roizy [name] ([id number]) [id number] Page 4 of 8 Respiratory Rate(breaths/min): 16 [5:Photos:] [N/A:N/A] Right, Plantar Foot N/A N/A Wound Location: Pressure Injury N/A N/A Wounding Event: Diabetic Wound/Ulcer of the Lower N/A N/A Primary Etiology: Extremity Cataracts, Anemia, Hypertension, N/A N/A Comorbid History: Type II Diabetes, Osteoarthritis, Osteomyelitis, Neuropathy, Confinement Anxiety 06/23/2021 N/A N/A Date Acquired: 22 N/A N/A Weeks of Treatment: Open N/A N/A Wound Status: No N/A N/A Wound Recurrence: 0.5x0.7x0.2 N/A N/A Measurements L x W x D (cm) 0.275 N/A N/A A (cm) : rea 0.055 N/A N/A Volume (cm) : -287.30% N/A N/A % Reduction in A rea: -292.90% N/A N/A % Reduction in Volume: 2 Starting Position 1 (o'clock): 7 Ending Position 1 (o'clock): 0.4 Maximum Distance 1 (cm): Yes N/A N/A Undermining: Grade 3 N/A N/A Classification: Medium N/A N/A Exudate A  mount: Serosanguineous N/A N/A Exudate Type: red, brown N/A N/A Exudate Color: Well defined, not attached N/A N/A Wound Margin: Large (67-100%) N/A N/A Granulation A mount: Red, Pale N/A N/A Granulation Quality: Small (1-33%) N/A N/A Necrotic A mount: Fat Layer (Subcutaneous Tissue): Yes N/A N/A Exposed Structures: None N/A N/A Epithelialization: Treatment Notes Electronic Signature(s) Signed: 04/18/2022 5:11:01 PM By: Massie Kluver Entered By: Massie Kluver on 04/18/2022 13:07:56 -------------------------------------------------------------------------------- Multi-Disciplinary Care Plan Details Patient Name: Date of Service: Daisy [name], Trinaty [name]. 04/18/2022 12:45 PM Medical Record Number: [id number] Patient Account Number: [id number] Date of Birth/Sex: Treating RN: Dec 09, 1950 (71 y.o. Orvan Falconer Primary Care Abdi Husak: Denton Lank Other Clinician: Massie Kluver Referring Curley Hogen:  Treating Alayzia Pavlock/Extender: Almedia Balls in Treatment: [address] [name], [name] ([id number]) [id number] Page 5 of 8 Wound/Skin Impairment Nursing Diagnoses: Impaired tissue integrity Knowledge deficit related to ulceration/compromised skin integrity Goals: Ulcer/skin breakdown will have a volume reduction of 30% by week 4 Date Initiated: 11/15/2021 Date Inactivated: 01/17/2022 Target Resolution Date: 12/13/2021 Goal Status: Unmet Unmet Reason: comorbities Ulcer/skin breakdown will have a volume reduction of 50% by week 8 Date Initiated: 11/15/2021 Date Inactivated: 01/17/2022 Target Resolution Date: 01/10/2022 Goal Status: Unmet Unmet Reason: comorbities Ulcer/skin breakdown will have a volume reduction of 80% by week 12 Date Initiated: 11/15/2021 Target Resolution Date: 02/07/2022 Goal Status: Active Ulcer/skin breakdown will heal within 14 weeks Date Initiated: 11/15/2021 Target Resolution Date: 02/21/2022 Goal Status:  Active Interventions: Assess patient/caregiver ability to obtain necessary supplies Assess patient/caregiver ability to perform ulcer/skin care regimen upon admission and as needed Assess ulceration(s) every visit Provide education on ulcer and skin care Notes: Electronic Signature(s) Signed: 04/18/2022 5:11:01 PM By: Massie Kluver Signed: 04/19/2022 4:00:23 PM By: Carlene Coria RN Entered By: Massie Kluver on 04/18/2022 13:07:39 -------------------------------------------------------------------------------- Pain Assessment Details Patient Name: Date of Service: Daisy [name], Zaakirah [name]. 04/18/2022 12:45 PM Medical Record Number: [id number] Patient Account Number: [id number] Date of Birth/Sex: Treating RN: 05-17-1951 (71 y.o. Orvan Falconer Primary Care Tonee Silverstein: Denton Lank Other Clinician: Massie Kluver Referring Khalie Wince: Treating Holle Sprick/Extender: Almedia Balls in Treatment: 22 Active Problems Location of Pain Severity and Description of Pain Patient Has Paino No Site Locations [name], [name] ([id number]) [id number] Page 6 of 8 Pain Management and Medication Current Pain Management: Electronic Signature(s) Signed: 04/18/2022 5:11:01 PM By: Massie Kluver Signed: 04/19/2022 4:00:23 PM By: Carlene Coria RN Entered By: Massie Kluver on 04/18/2022 12:58:29 -------------------------------------------------------------------------------- Patient/Caregiver Education Details Patient Name: Date of Service: Daisy [name], Christe [name]. 11/27/2023andnbsp12:45 PM Medical Record Number: [id number] Patient Account Number: [id number] Date of Birth/Gender: Treating RN: 1950-09-07 (71 y.o. Orvan Falconer Primary Care Physician: Denton Lank Other Clinician: Massie Kluver Referring Physician: Treating Physician/Extender: Almedia Balls in Treatment: 22 Education Assessment Education Provided To: Patient Education Topics  Provided Wound/Skin Impairment: Handouts: Other: continue wound care as directed Methods: Explain/Verbal Responses: State content correctly Electronic Signature(s) Signed: 04/18/2022 5:11:01 PM By: Massie Kluver Entered By: Massie Kluver on 04/18/2022 13:20:53 [name], [name] ([id number]) [id number] Page 7 of 8 -------------------------------------------------------------------------------- Wound Assessment Details Patient Name: Date of Service: [name], [place]. 04/18/2022 12:45 PM Medical Record Number: [id number] Patient Account Number: [id number] Date of Birth/Sex: Treating RN: 1950-09-26 (71 y.o. Orvan Falconer Primary Care Leland Staszewski: Denton Lank Other Clinician: Massie Kluver Referring Dajohn Ellender: Treating Breckin Zafar/Extender: Almedia Balls in Treatment: 22 Wound Status Wound Number: 5 Primary Diabetic Wound/Ulcer of the Lower Extremity Etiology: Wound Location: Right, Plantar Foot Wound Open Wounding Event: Pressure Injury Status: Date Acquired: 06/23/2021 Comorbid Cataracts, Anemia, Hypertension, Type II Diabetes, Osteoarthritis, Weeks Of Treatment: 22 History: Osteomyelitis, Neuropathy, Confinement Anxiety Clustered Wound: No Photos Wound Measurements Length: (cm) 0.5 Width: (cm) 0.7 Depth: (cm) 0.2 Area: (cm) 0.275 Volume: (cm) 0.055 % Reduction in Area: -287.3% % Reduction in Volume: -292.9% Epithelialization: None Tunneling: No Undermining: Yes Starting Position (o'clock): 2 Ending Position (o'clock): 7 Maximum Distance: (cm) 0.4 Wound Description Classification: Grade 3 Wound Margin: Well defined, not attached Exudate Amount: Medium Exudate Type: Serosanguineous Exudate Color: red, brown Foul Odor After Cleansing: No Slough/Fibrino No Wound Bed Granulation Amount: Large (67-100%) Exposed Structure Granulation Quality: Red, Pale Fat Layer (Subcutaneous Tissue)  Exposed: Yes Necrotic Amount: Small  (1-33%) Necrotic Quality: Adherent Slough Treatment Notes Wound #5 (Foot) Wound Laterality: Plantar, Right Cleanser [place] Ancillary Kit - 15 Day Supply Discharge Instruction: Use supplies as instructed; Kit contains: (15) Saline Bullets; (15) 3x3 Gauze; 15 pr Gloves [name], [name] ([id number]) [id number] Page 8 of 8 Soap and Water Discharge Instruction: Gently cleanse wound with antibacterial soap, rinse and pat dry prior to dressing wounds Peri-Wound Care Topical Primary Dressing Prisma 4.34 (in) Discharge Instruction: PLEASE PACK DRY INTO WOUND (Undermining 12 oclock to 12 oclock , deepest point is at heel Secondary Dressing (BORDER) Zetuvit Plus SILICONE BORDER Dressing 4x4 (in/in) Discharge Instruction: Please do not put silicone bordered dressings under wraps. Use non-bordered dressing only. Secured With Compression Wrap Compression Stockings Environmental education officer) Signed: 04/18/2022 5:11:01 PM By: Massie Kluver Signed: 04/19/2022 4:00:23 PM By: Carlene Coria RN Entered By: Massie Kluver on 04/18/2022 13:07:23 -------------------------------------------------------------------------------- Vitals Details Patient Name: Date of Service: [name], Calynn [name]. 04/18/2022 12:45 PM Medical Record Number: [id number] Patient Account Number: [id number] Date of Birth/Sex: Treating RN: [birth year] (71 y.o. Orvan Falconer Primary Care Jaykob Minichiello: Denton Lank Other Clinician: Massie Kluver Referring Seana Underwood: Treating Manasi Dishon/Extender: Almedia Balls in Treatment: 22 Vital Signs Time Taken: 12:55 Temperature ([name]): 98.1 Height (in): 67 Pulse (bpm): 73 Weight (lbs): 248 Respiratory Rate (breaths/min): 16 Body Mass Index (BMI): 38.8 Blood Pressure (mmHg): 126/73 Reference Range: 80 - 120 mg / dl Electronic Signature(s) Signed: 04/18/2022 5:11:01 PM By: Massie Kluver Entered By: Massie Kluver on 04/18/2022 12:58:25

## 2022-04-18 NOTE — Progress Notes (Signed)
[name], [name] ([id number]) [id number] Page 1 of 9 Visit Report for 04/18/2022 Chief Complaint Document Details Patient Name: Date of Service: [name], [name] 04/18/2022 12:45 PM Medical Record Number: [id number] Patient Account Number: [id number] Date of Birth/Sex: Treating RN: 1950/10/15 (71 y.o. Orvan Falconer Primary Care Provider: Denton Lank Other Clinician: Massie Kluver Referring Provider: Treating Provider/Extender: Almedia Balls in Treatment: 22 Information Obtained from: Patient Chief Complaint Right Foot Ulcer Electronic Signature(s) Signed: 04/18/2022 12:53:15 PM By: Worthy Keeler PA-C Entered By: Worthy Keeler on 04/18/2022 12:53:14 -------------------------------------------------------------------------------- Debridement Details Patient Name: Date of Service: [name], [name] 04/18/2022 12:45 PM Medical Record Number: [id number] Patient Account Number: [id number] Date of Birth/Sex: Treating RN: September 07, 1950 (71 y.o. Orvan Falconer Primary Care Provider: Denton Lank Other Clinician: Massie Kluver Referring Provider: Treating Provider/Extender: Almedia Balls in Treatment: 22 Debridement Performed for Assessment: Wound #5 Right,Plantar Foot Performed By: Physician Tommie Sams., PA-C Debridement Type: Debridement Severity of Tissue Pre Debridement: Fat layer exposed Level of Consciousness (Pre-procedure): Awake and Alert Pre-procedure Verification/Time Out Yes - 13:19 Taken: Start Time: 13:19 T Area Debrided (L x W): otal 1 (cm) x 1 (cm) = 1 (cm) Tissue and other material debrided: Viable, Non-Viable, Callus, Slough, Subcutaneous, Slough Level: Skin/Subcutaneous Tissue Debridement Description: Excisional Instrument: Curette Bleeding: Minimum Hemostasis Achieved: Pressure Response to Treatment: Procedure was tolerated well Level of Consciousness (Post- Awake and  Alert procedure): Garinger, Kimberlynn F ([id number]) [id number] Page 2 of 9 Post Debridement Measurements of Total Wound Length: (cm) 0.5 Width: (cm) 0.7 Depth: (cm) 0.2 Volume: (cm) 0.055 Character of Wound/Ulcer Post Debridement: Stable Severity of Tissue Post Debridement: Fat layer exposed Post Procedure Diagnosis Same as Pre-procedure Electronic Signature(s) Signed: 04/18/2022 4:14:29 PM By: Worthy Keeler PA-C Signed: 04/18/2022 5:11:01 PM By: Massie Kluver Signed: 04/19/2022 4:00:23 PM By: Carlene Coria RN Entered By: Massie Kluver on 04/18/2022 13:20:01 -------------------------------------------------------------------------------- HPI Details Patient Name: Date of Service: Daisy PP, [name] 04/18/2022 12:45 PM Medical Record Number: [id number] Patient Account Number: [id number] Date of Birth/Sex: Treating RN: 1951/05/09 (71 y.o. Orvan Falconer Primary Care Provider: Denton Lank Other Clinician: Massie Kluver Referring Provider: Treating Provider/Extender: Almedia Balls in Treatment: 60 History of Present Illness HPI Description: this is a 71 year old morbidly obese patient who has uncontrolled diabetes and has not been taking treatment for diabetes for at least 6 months. She presents with bilateral heel ulcers of only 2 weeks duration. He was sent to [place] by her PCP [name]. After this the patient was seen both by the podiatrist [name] and her vascular surgeon [name]. from what I understand from the son [name] has done some process on her and a procedure and says that he has finished working on her and no further vascular intervention is to be done. the patient was sent was for a further evaluation of her wounds. Her son has been doing some dressings at home on a twice a daily basis with Silvadene ointment. Some recent blood work done on 07/19/2014 shows that her hemoglobin was 7.9 with a hematocrit of 25.7. Her  white count was 12.7 and her glucose was 175 mg/dL. Her hemoglobin A1c was 8.4%. She has had no recent x-rays available for review. 07/31/2014. I was able to review the office visit notes from [name] on 07/14/2014. The patient's ABI on the right was 0.85 and on the left was 0.89 and both were abnormal. His assessment  and plan was that the patient had severe atherosclerotic changes of both lower extremities associated with ulceration and tissue loss of the foot and this represent a limb threatening ischemia and he recommended a angiography of the lower extremities with hope for intervention for limb salvage. This was scheduled the procedure was done on 07/22/14 The surgery performed was an abdominal aortogram with the right lower extremity runoff and catheter placement. 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 08/07/2014 x-rays were done of both feet on March 10. The right heel showed soft tissue wound, with some calcaneus lesion suspicious for osteomyelitis. An MRI would be recommended. the left heel did not show any evidence of osteomyelitis. 08/14/2014 -- Reports on Daisy T :app Cardiology reports reviewed from the [facility] clinic by [name] 2-D echocardiography done on 08/09/2013 showed that her normal LV systolic function with ejection fraction of 55% She also had mild mitral and tricuspid insufficiency. 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 Culture report of the bone shows several organisms which are grown and she has been put on Bactrim DS one twice a day for 14  days. The patient's bariatric surgeon Dr. Duke [name] did speak with me today regarding the application of ACell and suggested that he would be happy to apply this in case the patient was ready for it. I had a detailed discussion with him regarding the patient's condition and I have recommended at the present time the patient would benefit from hyperbaric oxygen therapy, IV antibiotics and supportive wound care. if at some stage the wound was ready for application of a cell I would be happy to talk to him about taking over her care. He agreed with the treatment plan and said he would be in touch. Readmission: LEXI, CONATY ([id number]) [id number] Page 3 of 9 11-15-2021 upon evaluation today patient presents for initial inspection here in our clinic concerning issues that she has been having with a wound on the plantar aspect of her right foot. I did review notes as well and it appears that she had a bone spur noted but no signs of osteomyelitis this was in May when she saw [name]. With that being said he apparently was recommended surgery to go in and remove the bone spur as he feels like that is the causative reason for the wound and indeed I feel like that may be the case as well based on what I am seeing. Fortunately I do not see any evidence of active infection locally or systemically right now although there is definitely some callus buildup and there is definitely more fluid trapped underneath that is hard to even tell which is what we are seeing currently working have to remove some of this callus in order to identify what is even going on underneath and how deep this really goes. The patient does have a history of diabetes mellitus type 2, diabetic neuropathy, and hypertension. Otherwise there are no major medical problems that would affect her wound healing. Her most recent hemoglobin A1c was on 06-16-2021 and is stated to be good  at 6.7 11-29-2021 upon evaluation today  patient appears to be doing well currently in regard to her wound. She has been tolerating the dressing changes without complication. Fortunately there does not appear to be any evidence of active infection locally or systemically which is great news. No fevers, chills, nausea, vomiting, or diarrhea. I do think the Hydrofera Blue rope is doing decently well. With that being said she does have a little callus that builds up I am after cleaning this away as well as cleaning some of the base of the wound as well. She does have her MRI scheduled for later today. 12-20-2021 upon evaluation today patient appears to be doing well currently in regard to her heel ulcer. This is actually significantly improved compared to previous evaluations. I do think that we are on the right track here and we are seeing some definitive improvements. Overall I think that the patient is on the right track. I do not see any evidence of active infection systemically and locally I feel like this is also doing much better. She has been on doxycycline which I placed her on beginning 11-30-2021. This will be for 2 months and she has not yet completed the first month.Marland Kitchen 01-03-2022 upon evaluation today patient appears to be doing well currently in regard to her wound. This is actually showing signs of excellent improvement is noted completely to the surface there is just a little bit of biofilm although we are can have to clear this away with sharp debridement as she is saline and gauze treatment is not enough to get this loosened today. 01-03-2022 upon evaluation today patient appears to be doing about the same in regard to her wound. The good news is she still on the antibiotics and nothing seems to be getting worse but notices that she is still not really showing a lot of improvement in the undermining area as of yet. Not really what we will try to work on here is get some undermining to reattach them to family. T that end I think that  we may need to be packing this a little bit more tightly with the o collagen and doing it dry. If we do this then it will start to moisten and resolve and it will not actually be over packed but hopefully should allow for more significant healing going forward. Patient voiced understanding and is in agreement with that plan. 01-17-2022 upon evaluation today patient appears to be doing about the same with regard to her wound. This is on the plantar aspect of the heel. With that being said it appears that this is not being packed as it should be. We needed to really be filling the undermining space with the collagen she had the dressing change this morning and that is definitely not what was there I think he might see some of the miscommunication in regards to the notes to home health. We will try to clarify that. 02-14-2022 upon evaluation today patient appears to be doing excellent in regard to her wound. She is actually tolerating the dressing changes without complication. Fortunately there does not appear to be any evidence of active infection locally or systemically at this time which is great news. No fevers, chills, nausea, vomiting, or diarrhea. I do believe that the collagen is doing a great job here. 02-28-2022 upon evaluation today patient appears to be doing well with regard to her wound although it may be that this was a little bit over packed with the collagen.  We want it to be filling the space but not overly fill in the space. With that being said I think in general she seems to be making some good progress here though and I do believe the collagen has been much better for her than what we were doing previous with the [facility]. 03-14-2022 upon evaluation patient's wound is actually showing signs of good granulation and epithelization at this point. Fortunately I think they were showing signs of significant improvement the undermining is better compared to what its been although we  still do have some deeper areas especially toward the heel I do not feel any bone exposed which is good news. She is currently done with the doxycycline which I had her on for 2 months. We have seen dramatic improvement during that time and I am very pleased in that regard. With that being said she still does have some need for filling in of this undermining area and that is what we are using the collagen for at this point. 11/6; very difficult wound on the plantar right heel in this patient. She has an offloading walker but she says she only uses this to walk to go to the bathroom. Otherwise she is in her wheelchair at an assisted living facility. We have been using silver collagen 04-18-2022 upon evaluation today patient actually is making excellent improvement. I am very pleased with where things stand I do believe that we are headed in the right direction here. There does not appear to be any signs of infection locally or systemically at this time which is great news. Electronic Signature(s) Signed: 04/18/2022 1:38:37 PM By: Worthy Keeler PA-C Entered By: Worthy Keeler on 04/18/2022 13:38:36 -------------------------------------------------------------------------------- Physical Exam Details Patient Name: Date of Service: Daisy PP, Devine F. 04/18/2022 12:45 PM Medical Record Number: [id number] Patient Account Number: [id number] Date of Birth/Sex: Treating RN: [birth year] (71 y.o. Orvan Falconer Primary Care Provider: Denton Lank Other Clinician: Massie Kluver Referring Provider: Treating Provider/Extender: Almedia Balls in Treatment: 74 Constitutional Well-nourished and well-hydrated in no acute distress. LONETTA, BLASSINGAME ([id number]) [id number] Page 4 of 9 Respiratory normal breathing without difficulty. Psychiatric this patient is able to make decisions and demonstrates good insight into disease process. Alert and Oriented x 3.  pleasant and cooperative. Notes Upon inspection patient's wound bed actually showed signs of good granulation and epithelization at this point. Fortunately I see no evidence of infection locally or systemically which is great news and overall I do believe that we are headed in the right direction here. I think that in fact the undermining has pretty much almost completely resolved the deepest area being somewhere around 0.4 that was before debridement once I cleared away the callus slough and subcutaneous tissue I am not even sure that that is an issue any longer as far as any type of undermining is concerned. Electronic Signature(s) Signed: 04/18/2022 1:39:06 PM By: Worthy Keeler PA-C Entered By: Worthy Keeler on 04/18/2022 13:39:06 -------------------------------------------------------------------------------- Physician Orders Details Patient Name: Date of Service: Daisy PP, Rudean F. 04/18/2022 12:45 PM Medical Record Number: [id number] Patient Account Number: [id number] Date of Birth/Sex: Treating RN: July 11, 1950 (71 y.o. 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 Nurse Visit as needed [place]: - [place] for wound care. May utilize formulary equivalent dressing for wound treatment orders unless otherwise specified. Home Health Nurse may visit PRN to address patients wound care needs. Scheduled days for dressing changes to be  completed; exception, patient has scheduled wound care visit that day. **Please direct any NON-WOUND related issues/requests for orders to patient's Primary Care Physician. **If current dressing causes regression in wound condition, may D/C ordered dressing product/s and apply Normal Saline Moist Dressing daily until next [place] or Other MD appointment. **Notify [facility] of regression in wound condition at [phone]. Bathing/ [employer] wounds with antibacterial soap and water. May shower; gently cleanse wound with antibacterial soap, rinse and pat dry prior to dressing wounds No tub bath. Off-Loading Other: - continue to wear crow walker boot to relieve pressure off wound Wound Treatment Wound #5 - Foot Wound Laterality: Plantar, Right Cleanser: [place] Ancillary Kit - 15 Day Supply 3 x Per Week/30 Days Discharge Instructions: Use supplies as instructed; Kit contains: (15) Saline Bullets; (15) 3x3 Gauze; 15 pr Gloves Cleanser: Soap and Water 3 x Per Week/30 Days Cartaya, Mishaal F ([id number]) [id number] Page 5 of 9 Discharge Instructions: Gently cleanse wound with antibacterial soap, rinse and pat dry prior to dressing wounds Prim Dressing: Prisma 4.34 (in) ([place]) 3 x Per Week/30 Days ary Discharge Instructions: PLEASE PACK DRY INTO WOUND (Undermining 12 oclock to 12 oclock , deepest point is at heel Secondary Dressing: (BORDER) Zetuvit Plus SILICONE BORDER Dressing 4x4 (in/in) ([place]) 3 x Per Week/30 Days Discharge Instructions: Please do not put silicone bordered dressings under wraps. Use non-bordered dressing only. Electronic Signature(s) Signed: 04/18/2022 4:14:29 PM By: Worthy Keeler PA-C Signed: 04/18/2022 5:11:01 PM By: Massie Kluver Entered By: Massie Kluver on 04/18/2022 13:30:33 -------------------------------------------------------------------------------- Problem List Details Patient Name: Date  of Service: [name], Savi F. 04/18/2022 12:45 PM Medical Record Number: [id number] Patient Account Number: [id number] Date of Birth/Sex: Treating RN: 01/27/1951 (71 y.o. 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 04/18/2022 12:45 PM Medical Record Number: [id number] Patient Account Number: [id number] Date of Birth/Sex: Treating RN: [birth year] (71 y.o. Orvan Falconer Primary Care Provider: Denton Lank Other Clinician: Massie Kluver Referring Provider: Treating Provider/Extender: Almedia Balls in Treatment: 22 Subjective Chief Complaint Information obtained from Patient Right Foot Ulcer History of Present Illness (HPI) this is a 71 year old morbidly obese patient who has uncontrolled diabetes and has not been taking treatment for diabetes for at least 6 months. She presents with bilateral heel ulcers of only 2 weeks duration. He was sent to [place] by her  PCP [name]. After this the patient was seen both by the podiatrist [name] and her vascular  surgeon [name]. from what I understand from the son [name] has done some process on her and a procedure and says that he has finished working on her and no further vascular intervention is to be done. the patient was sent was for a further evaluation of her wounds. Her son has been doing some dressings at home on a twice a daily basis with Silvadene ointment. Some recent blood work done on 07/19/2014 shows that her hemoglobin was 7.9 with a hematocrit of 25.7. Her white count was 12.7 and her glucose was 175 mg/dL. Her hemoglobin A1c was 8.4%. She has had no recent x-rays available for review. 07/31/2014. I was able to review the office visit notes from [name] on 07/14/2014. The patient's ABI on the right was 0.85 and on the left was 0.89 and both were abnormal. His assessment and plan was that the patient had severe atherosclerotic changes of both lower extremities associated with ulceration and tissue loss of the foot and this represent a limb threatening ischemia and he recommended a angiography of the lower extremities with hope for intervention for limb salvage. This was scheduled the procedure was done on 07/22/14 The surgery performed was an abdominal aortogram with the right lower extremity runoff and catheter placement. 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 08/07/2014 x-rays were done of both feet on March 10. The right heel showed soft tissue wound, with some calcaneus lesion suspicious for osteomyelitis. An MRI would be recommended. the left heel did not show any evidence of osteomyelitis. 08/14/2014 -- Reports on Urania T :app Cardiology reports reviewed from the [facility] clinic by  [name] 2-D echocardiography done on 08/09/2013 showed that her normal LV systolic function with ejection fraction of 55% She also had mild mitral and tricuspid insufficiency. 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 Culture report of the bone shows several organisms which are grown and she has been put on Bactrim DS one twice a day for 14 days. The patient's bariatric surgeon Dr. Duke [name] did speak with me today regarding the application of ACell and suggested that he would be happy to apply this in case the patient was ready for it. I had a detailed discussion with him regarding the patient's condition and I have recommended at the present time the patient would benefit from hyperbaric oxygen therapy, IV antibiotics and supportive wound care. if at some stage the wound was ready for application of a cell I would be happy to talk to him about taking over her care. He agreed with the treatment plan and said he would be in touch. Readmission: 11-15-2021 upon evaluation today patient presents for initial inspection here in our clinic concerning issues that she has been having with a wound on the plantar aspect of her right foot. I did review notes as well and it appears that she had a bone spur noted but no signs of osteomyelitis this was in May when she saw  [name]. With that being said he apparently was recommended surgery to go in and remove the bone spur as he feels like that is the causative reason for the wound and indeed I feel like that may be the case as well based on what I am seeing. Fortunately I do not see any evidence of active infection locally or systemically right now although there is definitely some callus buildup and there is definitely more fluid trapped underneath that is hard to even tell which is what we are seeing currently working have to remove some of  this callus in order to identify what is even going on underneath and how deep this really goes. The patient does have a history of diabetes mellitus type 2, diabetic neuropathy, and hypertension. Otherwise there are no major medical problems that would affect her wound healing. Her most recent hemoglobin A1c was on 06-16-2021 and is stated to be good at 6.7 11-29-2021 upon evaluation today patient appears to be doing well currently in regard to her wound. She has been tolerating the dressing changes without complication. Fortunately there does not appear to be any evidence of active infection locally or systemically which is great news. No fevers, chills, nausea, vomiting, or diarrhea. I do think the Hydrofera Blue rope is doing decently well. With that being said she does have a little callus that builds up I am after cleaning this away as well as cleaning some of the base of the wound as well. She does have her MRI scheduled for later today. 12-20-2021 upon evaluation today patient appears to be doing well currently in regard to her heel ulcer. This is actually significantly improved compared to previous evaluations. I do think that we are on the right track here and we are seeing some definitive improvements. Overall I think that the patient is on the right track. I do not see any evidence of active infection systemically and locally I feel like this is also doing much better. She has been on doxycycline which I placed her on beginning 11-30-2021. This will be for 2 months and she has not yet completed the first month.Marland Kitchen 01-03-2022 upon evaluation today patient appears to be doing well currently in regard to her wound. This is actually showing signs of excellent improvement is Galli, Dereona F ([id number]) [id number] Page 7 of 9 noted completely to the surface there is just a little bit of biofilm although we are can have to clear this away with sharp debridement as she is saline  and gauze treatment is not enough to get this loosened today. 01-03-2022 upon evaluation today patient appears to be doing about the same in regard to her wound. The good news is she still on the antibiotics and nothing seems to be getting worse but notices that she is still not really showing a lot of improvement in the undermining area as of yet. Not really what we will try to work on here is get some undermining to reattach them to family. T that end I think that we may need to be packing this a little bit more tightly with the o collagen and doing it dry. If we do this then it will start to moisten and resolve and it will not actually be over packed but hopefully should allow for more significant healing going forward. Patient voiced understanding and is in agreement with that plan. 01-17-2022 upon evaluation today patient appears to be doing about the same with regard to her wound. This is on  the plantar aspect of the heel. With that being said it appears that this is not being packed as it should be. We needed to really be filling the undermining space with the collagen she had the dressing change this morning and that is definitely not what was there I think he might see some of the miscommunication in regards to the notes to home health. We will try to clarify that. 02-14-2022 upon evaluation today patient appears to be doing excellent in regard to her wound. She is actually tolerating the dressing changes without complication. Fortunately there does not appear to be any evidence of active infection locally or systemically at this time which is great news. No fevers, chills, nausea, vomiting, or diarrhea. I do believe that the collagen is doing a great job here. 02-28-2022 upon evaluation today patient appears to be doing well with regard to her wound although it may be that this was a little bit over packed with the collagen. We want it to be filling the space but not overly fill in the space.  With that being said I think in general she seems to be making some good progress here though and I do believe the collagen has been much better for her than what we were doing previous with the [facility]. 03-14-2022 upon evaluation patient's wound is actually showing signs of good granulation and epithelization at this point. Fortunately I think they were showing signs of significant improvement the undermining is better compared to what its been although we still do have some deeper areas especially toward the heel I do not feel any bone exposed which is good news. She is currently done with the doxycycline which I had her on for 2 months. We have seen dramatic improvement during that time and I am very pleased in that regard. With that being said she still does have some need for filling in of this undermining area and that is what we are using the collagen for at this point. 11/6; very difficult wound on the plantar right heel in this patient. She has an offloading walker but she says she only uses this to walk to go to the bathroom. Otherwise she is in her wheelchair at an assisted living facility. We have been using silver collagen 04-18-2022 upon evaluation today patient actually is making excellent improvement. I am very pleased with where things stand I do believe that we are headed in the right direction here. There does not appear to be any signs of infection locally or systemically at this time which is great news. Objective Constitutional Well-nourished and well-hydrated in no acute distress. Vitals Time Taken: 12:55 PM, Height: 67 in, Weight: 248 lbs, BMI: 38.8, Temperature: 98.1 F, Pulse: 73 bpm, Respiratory Rate: 16 breaths/min, Blood Pressure: 126/73 mmHg. Respiratory normal breathing without difficulty. Psychiatric this patient is able to make decisions and demonstrates good insight into disease process. Alert and Oriented x 3. pleasant and cooperative. General Notes:  Upon inspection patient's wound bed actually showed signs of good granulation and epithelization at this point. Fortunately I see no evidence of infection locally or systemically which is great news and overall I do believe that we are headed in the right direction here. I think that in fact the undermining has pretty much almost completely resolved the deepest area being somewhere around 0.4 that was before debridement once I cleared away the callus slough and subcutaneous tissue I am not even sure that that is an issue any longer as far  as any type of undermining is concerned. Integumentary (Hair, Skin) Wound #5 status is Open. Original cause of wound was Pressure Injury. The date acquired was: 06/23/2021. The wound has been in treatment 22 weeks. The wound is located on the [place]. The wound measures 0.5cm length x 0.7cm width x 0.2cm depth; 0.275cm^2 area and 0.055cm^3 volume. There is Fat Layer (Subcutaneous Tissue) exposed. There is no tunneling noted, however, there is undermining starting at 2:00 and ending at 7:00 with a maximum distance of 0.4cm. There is a medium amount of serosanguineous drainage noted. The wound margin is well defined and not attached to the wound base. There is large (67-100%) red, pale granulation within the wound bed. There is a small (1-33%) amount of necrotic tissue within the wound bed including Adherent Slough. 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 There was a  Excisional Skin/Subcutaneous Tissue Debridement with a total area of 1 sq cm performed by Tommie Sams., PA-C. With the following instrument(s): Curette to remove Viable and Non-Viable tissue/material. Material removed includes Callus, Subcutaneous Tissue, and Slough. A time out was conducted at 13:19, prior to the start of the procedure. A Minimum amount of bleeding was controlled with Pressure. The procedure was tolerated well. Post Debridement Measurements: 0.5cm length x 0.7cm width x 0.2cm depth; 0.055cm^3 volume. Character of Wound/Ulcer Post Debridement is stable. Severity of Tissue Post Debridement is: Fat layer exposed. Post procedure Diagnosis Wound #5: Same as Pre-Procedure Plan Follow-up Appointments: Return Appointment in 2 weeks. Nurse Visit as needed Home Health: [place]: - [place] for wound care. May utilize formulary equivalent dressing for wound treatment orders unless otherwise specified. Home Health Nurse may visit PRN to address patientoos wound care needs. Scheduled days for dressing changes to be completed; exception, patient has scheduled wound care visit that day. **Please direct any NON-WOUND related issues/requests for orders to patient's Primary Care Physician. **If current dressing causes regression in wound condition, may D/C ordered dressing product/s and apply Normal Saline Moist Dressing daily until next [facility] or Other MD appointment. **Notify [facility] of regression in wound condition at [phone]. Bathing/ Shower/ Hygiene: Wash wounds with antibacterial soap and water. May shower; gently cleanse wound with antibacterial soap, rinse and pat dry prior to dressing wounds No tub bath. Off-Loading: Other: - continue to wear crow walker boot to relieve pressure off wound WOUND #5: - Foot Wound Laterality: Plantar, Right Cleanser: [place] Ancillary Kit - 15 Day Supply 3 x Per Week/30 Days Discharge  Instructions: Use supplies as instructed; Kit contains: (15) Saline Bullets; (15) 3x3 Gauze; 15 pr Gloves Cleanser: Soap and Water 3 x Per Week/30 Days Discharge Instructions: Gently cleanse wound with antibacterial soap, rinse and pat dry prior to dressing wounds Prim Dressing: Prisma 4.34 (in) ([place]) 3 x Per Week/30 Days ary Discharge Instructions: PLEASE PACK DRY INTO WOUND (Undermining 12 oclock to 12 oclock , deepest point is at heel Secondary Dressing: (BORDER) Zetuvit Plus SILICONE BORDER Dressing 4x4 (in/in) (Home Health) 3 x Per Week/30 Days Discharge Instructions: Please do not put silicone bordered  dressings under wraps. Use non-bordered dressing only. 1. I am good recommend that we have the patient continue to monitor for any evidence of worsening or infection. Obviously based on what I am seeing right now I do believe that the patient is going to require continued treatment with the Prisma which is doing awesome. 2. I am also can recommend that we continue with the bordered foam dressing to cover. 3. I would also suggest that the patient should continue to use her clamshell Crow walker boot which I think is doing a good job. We will see patient back for reevaluation in 1 week here in the clinic. If anything worsens or changes patient will contact our office for additional recommendations. Electronic Signature(s) Signed: 04/18/2022 1:39:40 PM By: Worthy Keeler PA-C Entered By: Worthy Keeler on 04/18/2022 13:39:40 -------------------------------------------------------------------------------- SuperBill Details Patient Name: Date of Service: [name], Keyasia F. 04/18/2022 Medical Record Number: [id number] Patient Account Number: [id number] Date of Birth/Sex: Treating RN: April 07, 1951 (71 y.o. Orvan Falconer Primary Care Provider: Denton Lank Other Clinician: Massie Kluver Referring Provider: Treating Provider/Extender: Almedia Balls in Treatment:  [address], Glenis F ([id number]) [id number] Page 9 of 9 Diagnosis Coding ICD-10 Codes Code Description E11.621 Type 2 diabetes mellitus with foot ulcer L97.512 Non-pressure chronic ulcer of other part of right foot with fat layer exposed E11.43 Type 2 diabetes mellitus with diabetic autonomic (poly)neuropathy I10 Essential (primary) hypertension Facility Procedures : CPT4 Code: [id number] Description: 97529 - DEB SUBQ TISSUE 20 SQ CM/< ICD-10 Diagnosis Description L97.512 Non-pressure chronic ulcer of other part of right foot with fat layer exposed Modifier: Quantity: 1 Physician Procedures : CPT4 Code Description Modifier [id number] - WC PHYS SUBQ TISS 20 SQ CM ICD-10 Diagnosis Description L97.512 Non-pressure chronic ulcer of other part of right foot with fat layer exposed Quantity: 1 Electronic Signature(s) Signed: 04/18/2022 1:39:48 PM By: Worthy Keeler PA-C Entered By: Worthy Keeler on 04/18/2022 13:39:48

## 2022-05-02 ENCOUNTER — Encounter: Payer: 59 | Attending: Physician Assistant | Admitting: Physician Assistant

## 2022-05-02 DIAGNOSIS — I10 Essential (primary) hypertension: Secondary | ICD-10-CM | POA: Diagnosis not present

## 2022-05-02 DIAGNOSIS — E11621 Type 2 diabetes mellitus with foot ulcer: Secondary | ICD-10-CM | POA: Diagnosis not present

## 2022-05-02 DIAGNOSIS — E1143 Type 2 diabetes mellitus with diabetic autonomic (poly)neuropathy: Secondary | ICD-10-CM | POA: Diagnosis not present

## 2022-05-02 DIAGNOSIS — L97512 Non-pressure chronic ulcer of other part of right foot with fat layer exposed: Secondary | ICD-10-CM | POA: Diagnosis not present

## 2022-05-02 NOTE — Progress Notes (Addendum)
[name] ([id number]) [id number] Page 1 of 8 Visit Report for 05/02/2022 Arrival Information Details Patient Name: Date of Service: TA [place]. 05/02/2022 10:30 A M Medical Record Number: [id number] Patient Account Number: [id number] Date of Birth/Sex: Treating RN: 1950-07-20 (71 y.o. [name] Primary Care Camile Esters: Denton Lank Other Clinician: Massie Kluver Referring Charlette Hennings: Treating Norrin Shreffler/Extender: Almedia Balls in Treatment: 24 Visit Information History Since Last Visit All ordered tests and consults were completed: No Patient Arrived: Wheel Chair Added or deleted any medications: No Arrival Time: 11:04 Any new allergies or adverse reactions: No Transfer Assistance: EasyPivot Patient Lift Had a fall or experienced change in No Patient Identification Verified: Yes activities of daily living that may affect Secondary Verification Process Completed: Yes risk of falls: Patient Requires Transmission-Based No Signs or symptoms of abuse/neglect since last visito No Precautions: Hospitalized since last visit: No Patient Has Alerts: Yes Implantable device outside of the clinic excluding No Patient Alerts: Patient on Blood Thinner cellular tissue based products placed in the center ABI 11/15/21 L 1.17 R since last visit: 1.27 Has Dressing in Place as Prescribed: Yes Has Footwear/Offloading in Place as Prescribed: Yes Left: Other:offloading walker boot Pain Present Now: No Electronic Signature(s) Signed: 05/10/2022 4:45:28 PM By: Massie Kluver Entered By: Massie Kluver on 05/02/2022 11:14:46 -------------------------------------------------------------------------------- Clinic Level of Care Assessment Details Patient Name: Date of Service: TA [place]. 05/02/2022 10:30 A M Medical Record Number: [id number] Patient Account Number: [id number] Date of Birth/Sex: Treating RN: April 23, 1951 (71 y.o. [name] Primary Care [name]: Denton Lank Other Clinician: Massie Kluver Referring Alston Berrie: Treating Cem Kosman/Extender: Almedia Balls in Treatment: 24 Clinic Level of Care Assessment Items TOOL 1 Quantity Score _0  - 0 Use when EandM and Procedure is performed on INITIAL visit ASSESSMENTS - Nursing Assessment / Reassessment _1  - 0 General Physical Exam (combine w/ comprehensive assessment (listed just below) when performed on new pt. evals) [name], [name] ([id number]) [id number] Page 2 of 8 _2  - 0 Comprehensive Assessment (HX, ROS, Risk Assessments, Wounds Hx, etc.) ASSESSMENTS - Wound and Skin Assessment / Reassessment _3  - 0 Dermatologic / Skin Assessment (not related to wound area) ASSESSMENTS - Ostomy and/or Continence Assessment and Care _4  - 0 Incontinence Assessment and Management _5  - 0 Ostomy Care Assessment and Management (repouching, etc.) PROCESS - Coordination of Care _6  - 0 Simple Patient / Family Education for ongoing care _7  - 0 Complex (extensive) Patient / Family Education for ongoing care _8  - 0 Staff obtains Programmer, systems, Records, T Results / Process Orders est _9  - 0 Staff telephones HHA, Nursing Homes / Clarify orders / etc _10  - 0 Routine Transfer to another Facility (non-emergent condition) _11  - 0 Routine Hospital Admission (non-emergent condition) _12  - 0 New Admissions / Biomedical engineer / Ordering NPWT Apligraf, etc. , _13  - 0 Emergency Hospital Admission (emergent condition) PROCESS - Special Needs _14  - 0 Pediatric / Minor Patient Management _15  - 0 Isolation Patient Management _16  - 0 Hearing / Language / Visual special needs _17  - 0 Assessment of Community assistance (transportation, D/C planning, etc.) _18  - 0 Additional assistance / Altered mentation _19  - 0 Support Surface(s) Assessment (bed, cushion, seat, etc.) INTERVENTIONS - Miscellaneous _20  - 0 External ear exam _21  - 0 Patient  Transfer (multiple staff / Civil Service fast streamer / Similar devices) _22  - 0 Simple Staple / Suture removal (25 or less) _23  - 0 Complex Staple / Suture removal (26 or more) _24  -  0 Hypo/Hyperglycemic Management (do not check if billed separately) _0  - 0 Ankle / Brachial Index (ABI) - do not check if billed separately Has the patient been seen at the hospital within the last three years: Yes Total Score: 0 Level Of Care: ____ Electronic Signature(s) Signed: 05/10/2022 4:45:28 PM By: Massie Kluver Entered By: Massie Kluver on 05/02/2022 11:39:30 -------------------------------------------------------------------------------- Encounter Discharge Information Details Patient Name: Date of Service: TA Daisy Climes, [name]. 05/02/2022 10:30 A M Medical Record Number: [id number] Patient Account Number: [id number] Date of Birth/Sex: Treating RN: [birth year] (71 y.o. [name], [name] ([id number]) [id number] Page 3 of 8 Primary Care Bertin Inabinet: Denton Lank Other Clinician: Massie Kluver Referring Temika Sutphin: Treating Akima Slaugh/Extender: Almedia Balls in Treatment: 24 Encounter Discharge Information Items Post Procedure Vitals Discharge Condition: Stable Temperature (F): 98.0 Ambulatory Status: Wheelchair Pulse (bpm): 73 Discharge Destination: [place] Respiratory Rate (breaths/min): 18 Telephoned: Yes Blood Pressure (mmHg): 144/84 Orders Sent: Yes Transportation: Other Accompanied By: self Schedule Follow-up Appointment: Yes Clinical Summary of Care: Electronic Signature(s) Signed: 05/10/2022 4:45:28 PM By: Massie Kluver Entered By: Massie Kluver on 05/02/2022 13:18:15 -------------------------------------------------------------------------------- Lower Extremity Assessment Details Patient Name: Date of Service: [name], Stavroula F. 05/02/2022 10:30 A M Medical Record Number: [id number] Patient Account Number: [id number] Date of  Birth/Sex: Treating RN: 07-06-1950 (71 y.o. [name] Primary Care Garyson Stelly: Denton Lank Other Clinician: Massie Kluver Referring Latarra Eagleton: Treating Carrieanne Kleen/Extender: Almedia Balls in Treatment: 24 Electronic Signature(s) Signed: 05/03/2022 4:34:43 PM By: Carlene Coria RN Signed: 05/10/2022 4:45:28 PM By: Massie Kluver Entered By: Massie Kluver on 05/02/2022 11:23:40 -------------------------------------------------------------------------------- Multi Wound Chart Details Patient Name: Date of Service: TA Daisy Climes, Jazmaine F. 05/02/2022 10:30 A M Medical Record Number: [id number] Patient Account Number: [id number] Date of Birth/Sex: Treating RN: May 08, 1951 (71 y.o. [name] Primary Care Kahlea Cobert: Denton Lank Other Clinician: Massie Kluver Referring Saanvika Vazques: Treating Dearra Myhand/Extender: Almedia Balls in Treatment: 24 Vital Signs Height(in): 67 Pulse(bpm): 73 Weight(lbs): 248 Blood Pressure(mmHg): 144/84 Body Mass Index(BMI): 38.8 Brash, Jeneal F ([id number]) [id number] Page 4 of 8 Temperature(F): 98.0 Respiratory Rate(breaths/min): 18 [5:Photos:] [N/A:N/A] Right, Plantar Foot N/A N/A Wound Location: Pressure Injury N/A N/A Wounding Event: Diabetic Wound/Ulcer of the Lower N/A N/A Primary Etiology: Extremity Cataracts, Anemia, Hypertension, N/A N/A Comorbid History: Type II Diabetes, Osteoarthritis, Osteomyelitis, Neuropathy, Confinement Anxiety 06/23/2021 N/A N/A Date Acquired: 24 N/A N/A Weeks of Treatment: Open N/A N/A Wound Status: No N/A N/A Wound Recurrence: 0.2x0.5x0.2 N/A N/A Measurements L x W x D (cm) 0.079 N/A N/A A (cm) : rea 0.016 N/A N/A Volume (cm) : -11.30% N/A N/A % Reduction in A rea: -14.30% N/A N/A % Reduction in Volume: Grade 3 N/A N/A Classification: Medium N/A N/A Exudate A mount: Serosanguineous N/A N/A Exudate Type: red, brown N/A N/A Exudate  Color: Well defined, not attached N/A N/A Wound Margin: Large (67-100%) N/A N/A Granulation A mount: Red, Pale N/A N/A Granulation Quality: Small (1-33%) N/A N/A Necrotic A mount: Fat Layer (Subcutaneous Tissue): Yes N/A N/A Exposed Structures: Small (1-33%) N/A N/A Epithelialization: Treatment Notes Electronic Signature(s) Signed: 05/10/2022 4:45:28 PM By: Massie Kluver Entered By: Massie Kluver on 05/02/2022 11:23:51 -------------------------------------------------------------------------------- Multi-Disciplinary Care Plan Details Patient Name: Date of Service: TA Daisy Climes, Gwendalynn F. 05/02/2022 10:30 A M Medical Record Number: [id number] Patient Account Number: [id number] Date of Birth/Sex: Treating RN: [birth year] (71 y.o. [name] Primary Care Cinque Begley: Denton Lank Other Clinician: Massie Kluver Referring Coley Littles: Treating Joline Encalada/Extender: Jordan Likes  Weeks in Treatment: [address] Inactive Wound/Skin Impairment Andy, Rebeccah F ([id number]) [id number] Page 5 of 8 Nursing Diagnoses: Impaired tissue integrity Knowledge deficit related to ulceration/compromised skin integrity Goals: Ulcer/skin breakdown will have a volume reduction of 30% by week 4 Date Initiated: 11/15/2021 Date Inactivated: 01/17/2022 Target Resolution Date: 12/13/2021 Goal Status: Unmet Unmet Reason: comorbities Ulcer/skin breakdown will have a volume reduction of 50% by week 8 Date Initiated: 11/15/2021 Date Inactivated: 01/17/2022 Target Resolution Date: 01/10/2022 Goal Status: Unmet Unmet Reason: comorbities Ulcer/skin breakdown will have a volume reduction of 80% by week 12 Date Initiated: 11/15/2021 Target Resolution Date: 02/07/2022 Goal Status: Active Ulcer/skin breakdown will heal within 14 weeks Date Initiated: 11/15/2021 Target Resolution Date: 02/21/2022 Goal Status: Active Interventions: Assess patient/caregiver ability to obtain necessary  supplies Assess patient/caregiver ability to perform ulcer/skin care regimen upon admission and as needed Assess ulceration(s) every visit Provide education on ulcer and skin care Notes: Electronic Signature(s) Signed: 05/03/2022 4:34:43 PM By: Carlene Coria RN Signed: 05/10/2022 4:45:28 PM By: Massie Kluver Entered By: Massie Kluver on 05/02/2022 13:16:59 -------------------------------------------------------------------------------- Pain Assessment Details Patient Name: Date of Service: [name], Dayannara F. 05/02/2022 10:30 A M Medical Record Number: [id number] Patient Account Number: [id number] Date of Birth/Sex: Treating RN: [birth year] (71 y.o. [name] Primary Care Tanish Prien: Denton Lank Other Clinician: Massie Kluver Referring Jereme Loren: Treating Travoris Bushey/Extender: Almedia Balls in Treatment: 24 Active Problems Location of Pain Severity and Description of Pain Patient Has Paino No Site Locations SHAYLON, GILLEAN F ([id number]) [id number] Page 6 of 8 Pain Management and Medication Current Pain Management: Electronic Signature(s) Signed: 05/03/2022 4:34:43 PM By: Carlene Coria RN Signed: 05/10/2022 4:45:28 PM By: Massie Kluver Entered By: Massie Kluver on 05/02/2022 11:17:44 -------------------------------------------------------------------------------- Patient/Caregiver Education Details Patient Name: Date of Service: TA Daisy Climes, Temica F. 12/11/2023andnbsp10:30 [place] Record Number: [id number] Patient Account Number: [id number] Date of Birth/Gender: Treating RN: 1950/08/23 (71 y.o. [name] Primary Care Physician: Denton Lank Other Clinician: Massie Kluver Referring Physician: Treating Physician/Extender: Almedia Balls in Treatment: 24 Education Assessment Education Provided To: Patient Education Topics Provided Wound/Skin Impairment: Handouts: Other: continue wound care as directed Methods:  Explain/Verbal Responses: State content correctly Electronic Signature(s) Signed: 05/10/2022 4:45:28 PM By: Massie Kluver Entered By: Massie Kluver on 05/02/2022 13:16:44 Sikorski, Aneka F ([id number]) [id number] Page 7 of 8 -------------------------------------------------------------------------------- Wound Assessment Details Patient Name: Date of Service: TA [place]. 05/02/2022 10:30 A M Medical Record Number: [id number] Patient Account Number: [id number] Date of Birth/Sex: Treating RN: 1951-05-02 (71 y.o. [name] Primary Care Laritza Vokes: Denton Lank Other Clinician: Massie Kluver Referring Gerritt Galentine: Treating Deano Tomaszewski/Extender: Almedia Balls in Treatment: 24 Wound Status Wound Number: 5 Primary Diabetic Wound/Ulcer of the Lower Extremity Etiology: Wound Location: Right, Plantar Foot Wound Open Wounding Event: Pressure Injury Status: Date Acquired: 06/23/2021 Comorbid Cataracts, Anemia, Hypertension, Type II Diabetes, Osteoarthritis, Weeks Of Treatment: 24 History: Osteomyelitis, Neuropathy, Confinement Anxiety Clustered Wound: No Photos Wound Measurements Length: (cm) 0.2 Width: (cm) 0.5 Depth: (cm) 0.2 Area: (cm) 0.079 Volume: (cm) 0.016 % Reduction in Area: -11.3% % Reduction in Volume: -14.3% Epithelialization: Small (1-33%) Tunneling: No Undermining: No Wound Description Classification: Grade 3 Wound Margin: Well defined, not attached Exudate Amount: Medium Exudate Type: Serosanguineous Exudate Color: red, brown Foul Odor After Cleansing: No Slough/Fibrino No Wound Bed Granulation Amount: Large (67-100%) Exposed Structure Granulation Quality: Red, Pale Fat Layer (Subcutaneous Tissue) Exposed: Yes Necrotic Amount: Small (1-33%) Necrotic Quality: Adherent Slough Treatment Notes Wound #5 (  Foot) Wound Laterality: Plantar, Right Cleanser [place] Ancillary Kit - 15 Day Supply Discharge Instruction: Use  supplies as instructed; Kit contains: (15) Saline Bullets; (15) 3x3 Gauze; 15 pr Gloves Soap and Water Discharge Instruction: Gently cleanse wound with antibacterial soap, rinse and pat dry prior to dressing wounds Hoff, Kirstina F ([id number]) [id number] Page 8 of 8 Peri-Wound Care Topical Primary Dressing Prisma 4.34 (in) Discharge Instruction: PLEASE PACK DRY INTO WOUND (Undermining 12 oclock to 12 oclock , deepest point is at heel Secondary Dressing (BORDER) Zetuvit Plus SILICONE BORDER Dressing 4x4 (in/in) Discharge Instruction: Please do not put silicone bordered dressings under wraps. Use non-bordered dressing only. Secured With Compression Wrap Compression Stockings Environmental education officer) Signed: 05/03/2022 4:34:43 PM By: Carlene Coria RN Signed: 05/10/2022 4:45:28 PM By: Massie Kluver Entered By: Massie Kluver on 05/02/2022 11:22:56 -------------------------------------------------------------------------------- Vitals Details Patient Name: Date of Service: [name], Bernyce F. 05/02/2022 10:30 A M Medical Record Number: [id number] Patient Account Number: [id number] Date of Birth/Sex: Treating RN: Apr 26, 1951 (71 y.o. [name] Primary Care Chritopher Coster: Denton Lank Other Clinician: Massie Kluver Referring Kaytlen Lightsey: Treating Brittanny Levenhagen/Extender: Almedia Balls in Treatment: 24 Vital Signs Time Taken: 11:15 Temperature (F): 98.0 Height (in): 67 Pulse (bpm): 73 Weight (lbs): 248 Respiratory Rate (breaths/min): 18 Body Mass Index (BMI): 38.8 Blood Pressure (mmHg): 144/84 Reference Range: 80 - 120 mg / dl Electronic Signature(s) Signed: 05/10/2022 4:45:28 PM By: Massie Kluver Entered By: Massie Kluver on 05/02/2022 11:17:22

## 2022-05-02 NOTE — Progress Notes (Addendum)
Daisy, [name] ([id number]) [id number] Page 1 of 9 Visit Report for 05/02/2022 Chief Complaint Document Details Patient Name: Date of Service: Daisy KELIE, GAINEY. 05/02/2022 10:30 A M Medical Record Number: [id number] Patient Account Number: [id number] Date of Birth/Sex: Treating RN: 12/29/1950 (71 y.o. [name] Primary Care Provider: Denton [name] Other Clinician: Massie [name] Referring Provider: Treating Provider/Extender: [name] in Treatment: 24 Information Obtained from: Patient Chief Complaint Right Foot Ulcer Electronic Signature(s) Signed: 05/02/2022 10:34:50 AM By: Daisy Keeler PA-C Entered By: [name] on 05/02/2022 10:34:50 -------------------------------------------------------------------------------- Debridement Details Patient Name: Date of Service: [name], [name]. 05/02/2022 10:30 A M Medical Record Number: [id number] Patient Account Number: [id number] Date of Birth/Sex: Treating RN: 02-17-1951 (71 y.o. [name] Primary Care Provider: Denton [name] Other Clinician: Massie [name] Referring Provider: Treating Provider/Extender: [name] in Treatment: 24 Debridement Performed for Assessment: Wound #5 Right,Plantar Foot Performed By: Physician [name]., PA-C Debridement Type: Debridement Severity of Tissue Pre Debridement: Fat layer exposed Level of Consciousness (Pre-procedure): Awake and Alert Pre-procedure Verification/Time Out Yes - 11:35 Taken: Start Time: 11:35 [name] Area Debrided (L x W): otal 0.5 (cm) x 1 (cm) = 0.5 (cm) Tissue and other material debrided: Viable, Non-Viable, Callus, Slough, Subcutaneous, Slough Level: Skin/Subcutaneous Tissue Debridement Description: Excisional Instrument: Curette Bleeding: Minimum Hemostasis Achieved: Pressure Response to Treatment: Procedure was tolerated well Level of Consciousness (Post- Awake and  Alert procedure): [name], [name] ([id number]) [id number] Page 2 of 9 Post Debridement Measurements of Total Wound Length: (cm) 0.2 Width: (cm) 0.5 Depth: (cm) 0.2 Volume: (cm) 0.016 Character of Wound/Ulcer Post Debridement: Stable Severity of Tissue Post Debridement: Fat layer exposed Post Procedure Diagnosis Same as Pre-procedure Electronic Signature(s) Signed: 05/02/2022 1:22:59 PM By: Daisy Keeler PA-C Signed: 05/03/2022 4:34:43 PM By: Daisy Coria RN Signed: 05/10/2022 4:45:28 PM By: [name] Entered By: [name] on 05/02/2022 11:37:53 -------------------------------------------------------------------------------- HPI Details Patient Name: Date of Service: [name], [name]. 05/02/2022 10:30 A M Medical Record Number: [id number] Patient Account Number: [id number] Date of Birth/Sex: Treating RN: [birth year] (71 y.o. [name] Primary Care Provider: Denton [name] Other Clinician: Massie [name] Referring Provider: Treating Provider/Extender: [name] in Treatment: 24 History of Present Illness HPI Description: this is a 71 year old morbidly obese patient who has uncontrolled diabetes and has not been taking treatment for diabetes for at least 6 months. She presents with bilateral heel ulcers of only 2 weeks duration. He was sent to [place] by her PCP [name]. After this the patient was seen both by the podiatrist Dr. Caryl [name] and her vascular surgeon Dr. Delana [name]. from what I understand from the son Dr. Delana [name] has done some process on her and a procedure and says that he has finished working on her and no further vascular intervention is to be done. the patient was sent was for a further evaluation of her wounds. Her son has been doing some dressings at home on a twice a daily basis with Silvadene ointment. Some recent blood work done on 07/19/2014 shows that her hemoglobin was 7.9 with a hematocrit of 25.7.  Her white count was 12.7 and her glucose was 175 mg/dL. Her hemoglobin A1c was 8.4%. She has had no recent x-rays available for review. 07/31/2014. I was able to review the office visit notes from Dr. Delana [name] on 07/14/2014. The patient's ABI on the right was 0.85 and on the left was 0.89 and both were  abnormal. His assessment and plan was that the patient had severe atherosclerotic changes of both lower extremities associated with ulceration and tissue loss of the foot and this represent a limb threatening ischemia and he recommended a angiography of the lower extremities with hope for intervention for limb salvage. This was scheduled the procedure was done on 07/22/14 The surgery performed was an abdominal aortogram with the right lower extremity runoff and catheter placement. 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 08/07/2014 x-rays were done of both feet on March 10. The right heel showed soft tissue wound, with some calcaneus lesion suspicious for osteomyelitis. An MRI would be recommended. the left heel did not show any evidence of osteomyelitis. 08/14/2014 -- Reports on [name] :app Cardiology reports reviewed from the [name] clinic by Dr. Nehemiah [name] 2-D echocardiography done on 08/09/2013 showed that her normal LV systolic function with ejection fraction of 55% She also had mild mitral and tricuspid insufficiency. 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 Culture report of the bone shows several organisms which are grown and she has been put on Bactrim DS one twice a day for 14  days. The patient's bariatric surgeon Dr. Duke [name] did speak with me today regarding the application of ACell and suggested that he would be happy to apply this in case the patient was ready for it. I had a detailed discussion with him regarding the patient's condition and I have recommended at the present time the patient would benefit from hyperbaric oxygen therapy, IV antibiotics and supportive wound care. if at some stage the wound was ready for application of a cell I would be happy to talk to him about taking over her care. He agreed with the treatment plan and said he would be in touch. Readmission: [name], [name] ([id number]) [id number] Page 3 of 9 11-15-2021 upon evaluation today patient presents for initial inspection here in our clinic concerning issues that she has been having with a wound on the plantar aspect of her right foot. I did review notes as well and it appears that she had a bone spur noted but no signs of osteomyelitis this was in May when she saw Dr. Caryl [name]. With that being said he apparently was recommended surgery to go in and remove the bone spur as he feels like that is the causative reason for the wound and indeed I feel like that may be the case as well based on what I am seeing. Fortunately I do not see any evidence of active infection locally or systemically right now although there is definitely some callus buildup and there is definitely more fluid trapped underneath that is hard to even tell which is what we are seeing currently working have to remove some of this callus in order to identify what is even going on underneath and how deep this really goes. The patient does have a history of diabetes mellitus type 2, diabetic neuropathy, and hypertension. Otherwise there are no major medical problems that would affect her wound healing. Her most recent hemoglobin A1c was on 06-16-2021 and is stated  to be good at 6.7 11-29-2021 upon evaluation today  patient appears to be doing well currently in regard to her wound. She has been tolerating the dressing changes without complication. Fortunately there does not appear to be any evidence of active infection locally or systemically which is great news. No fevers, chills, nausea, vomiting, or diarrhea. I do think the Hydrofera Blue rope is doing decently well. With that being said she does have a little callus that builds up I am after cleaning this away as well as cleaning some of the base of the wound as well. She does have her MRI scheduled for later today. 12-20-2021 upon evaluation today patient appears to be doing well currently in regard to her heel ulcer. This is actually significantly improved compared to previous evaluations. I do think that we are on the right track here and we are seeing some definitive improvements. Overall I think that the patient is on the right track. I do not see any evidence of active infection systemically and locally I feel like this is also doing much better. She has been on doxycycline which I placed her on beginning 11-30-2021. This will be for 2 months and she has not yet completed the first month.Marland Kitchen 01-03-2022 upon evaluation today patient appears to be doing well currently in regard to her wound. This is actually showing signs of excellent improvement is noted completely to the surface there is just a little bit of biofilm although we are can have to clear this away with sharp debridement as she is saline and gauze treatment is not enough to get this loosened today. 01-03-2022 upon evaluation today patient appears to be doing about the same in regard to her wound. The good news is she still on the antibiotics and nothing seems to be getting worse but notices that she is still not really showing a lot of improvement in the undermining area as of yet. Not really what we will try to work on here is get some undermining to reattach them to family. [name] that end I think that  we may need to be packing this a little bit more tightly with the o collagen and doing it dry. If we do this then it will start to moisten and resolve and it will not actually be over packed but hopefully should allow for more significant healing going forward. Patient voiced understanding and is in agreement with that plan. 01-17-2022 upon evaluation today patient appears to be doing about the same with regard to her wound. This is on the plantar aspect of the heel. With that being said it appears that this is not being packed as it should be. We needed to really be filling the undermining space with the collagen she had the dressing change this morning and that is definitely not what was there I think he might see some of the miscommunication in regards to the notes to home health. We will try to clarify that. 02-14-2022 upon evaluation today patient appears to be doing excellent in regard to her wound. She is actually tolerating the dressing changes without complication. Fortunately there does not appear to be any evidence of active infection locally or systemically at this time which is great news. No fevers, chills, nausea, vomiting, or diarrhea. I do believe that the collagen is doing a great job here. 02-28-2022 upon evaluation today patient appears to be doing well with regard to her wound although it may be that this was a little bit over packed  with the collagen. We want it to be filling the space but not overly fill in the space. With that being said I think in general she seems to be making some good progress here though and I do believe the collagen has been much better for her than what we were doing previous with the [facility]. 03-14-2022 upon evaluation patient's wound is actually showing signs of good granulation and epithelization at this point. Fortunately I think they were showing signs of significant improvement the undermining is better compared to what its been although we  still do have some deeper areas especially toward the heel I do not feel any bone exposed which is good news. She is currently done with the doxycycline which I had her on for 2 months. We have seen dramatic improvement during that time and I am very pleased in that regard. With that being said she still does have some need for filling in of this undermining area and that is what we are using the collagen for at this point. 11/6; very difficult wound on the plantar right heel in this patient. She has an offloading walker but she says she only uses this to walk to go to the bathroom. Otherwise she is in her wheelchair at an assisted living facility. We have been using silver collagen 04-18-2022 upon evaluation today patient actually is making excellent improvement. I am very pleased with where things stand I do believe that we are headed in the right direction here. There does not appear to be any signs of infection locally or systemically at this time which is great news. 05-02-2022 upon evaluation today patient appears to be doing well currently in regard to her wound. She is actually showing signs of excellent improvement in overall I am extremely pleased with where things stand today. I do believe she is making excellent progress. I do not see any signs of active infection at this time which is great news. Electronic Signature(s) Signed: 05/02/2022 11:41:35 AM By: Daisy Keeler PA-C Entered By: [name] on 05/02/2022 11:41:35 -------------------------------------------------------------------------------- Physical Exam Details Patient Name: Date of Service: [name], [name]. 05/02/2022 10:30 A M Medical Record Number: [id number] Patient Account Number: [id number] Date of Birth/Sex: Treating RN: [birth year] (71 y.o. [name] Primary Care Provider: Denton [name] Other Clinician: Massie [name] Referring Provider: Treating Provider/Extender: [name] in  Treatment: [address], [name] ([id number]) [id number] Page 4 of 9 Constitutional Well-nourished and well-hydrated in no acute distress. Respiratory normal breathing without difficulty. Psychiatric this patient is able to make decisions and demonstrates good insight into disease process. Alert and Oriented x 3. pleasant and cooperative. Notes Upon inspection patient's wound bed actually showed signs of good granulation epithelization at this point. Fortunately I see no evidence of infection locally nor systemically which is great news and overall I am extremely pleased with where we stand today. Electronic Signature(s) Signed: 05/02/2022 11:42:14 AM By: Daisy Keeler PA-C Entered By: [name] on 05/02/2022 11:42:13 -------------------------------------------------------------------------------- Physician Orders Details Patient Name: Date of Service: [name], [name]. 05/02/2022 10:30 A M Medical Record Number: [id number] Patient Account Number: [id number] Date of Birth/Sex: Treating RN: 07-28-1950 (71 y.o. 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 Nurse Visit as needed [place]: - [place] for wound care. May utilize formulary equivalent dressing for wound treatment orders unless otherwise specified. Home Health Nurse may visit PRN to address patients wound care needs. Scheduled days for dressing  changes to be completed; exception, patient has scheduled wound care visit that day. **Please direct any NON-WOUND related issues/requests for orders to patient's Primary Care Physician. **If current dressing causes regression in wound condition, may D/C ordered dressing product/s and apply Normal Saline Moist Dressing daily until next [place] or Other MD appointment. **Notify [facility] of regression in wound condition at [phone]. Bathing/ [employer] wounds with antibacterial soap and water. May shower; gently cleanse wound with antibacterial soap, rinse and pat dry prior to dressing wounds No tub bath. Off-Loading Other: - continue to wear crow walker boot to relieve pressure off wound Wound Treatment Wound #5 - Foot Wound Laterality: Plantar, Right Cleanser: [employer] Ancillary Kit - 15 Day Supply 3 x Per Week/30 Days Discharge Instructions: Use supplies as instructed; Kit contains: (15) Saline Bullets; (15) 3x3 Gauze; 15 pr Gloves Kaseman, Soledad [name] ([id number]) [id number] Page 5 of 9 Cleanser: Soap and Water 3 x Per Week/30 Days Discharge Instructions: Gently cleanse wound with antibacterial soap, rinse and pat dry prior to dressing wounds Prim Dressing: Prisma 4.34 (in) ([place]) 3 x Per Week/30 Days ary Discharge Instructions: PLEASE PACK DRY INTO WOUND (Undermining 12 oclock to 12 oclock , deepest point is at heel Secondary Dressing: (BORDER) Zetuvit Plus SILICONE BORDER Dressing 4x4 (in/in) (Home Health) 3 x Per Week/30 Days Discharge Instructions: Please do not put silicone bordered dressings under wraps. Use non-bordered dressing only. Electronic Signature(s) Signed: 05/02/2022 1:22:59 PM By: Daisy Keeler PA-C Signed: 05/10/2022 4:45:28 PM By: [name] Entered By: [name] on 05/02/2022 11:38:09 -------------------------------------------------------------------------------- Problem List  Details Patient Name: Date of Service: [name], [name]. 05/02/2022 10:30 A M Medical Record Number: [id number] Patient Account Number: [id number] Date of Birth/Sex: Treating RN: 03-28-1951 (71 y.o. 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 05/02/2022 10:30 A M Medical Record Number: [id number] Patient Account Number: [id number] Date of Birth/Sex: Treating RN: 10-10-1950 (71 y.o. [name] Primary Care Provider: Denton [name] Other Clinician: Massie [name] Referring Provider: Treating Provider/Extender: [name] in Treatment: 24 Subjective Chief Complaint Information obtained from Patient Right Foot Ulcer History of Present Illness (HPI) this is a 71 year old morbidly obese patient who has uncontrolled diabetes and has not been taking treatment for diabetes for at least 6 months. She presents with bilateral heel ulcers of only 2 weeks duration.  He was sent to [place] by her PCP [name]. After this the patient was seen both by  the podiatrist Dr. Caryl [name] and her vascular surgeon Dr. Delana [name]. from what I understand from the son Dr. Delana [name] has done some process on her and a procedure and says that he has finished working on her and no further vascular intervention is to be done. the patient was sent was for a further evaluation of her wounds. Her son has been doing some dressings at home on a twice a daily basis with Silvadene ointment. Some recent blood work done on 07/19/2014 shows that her hemoglobin was 7.9 with a hematocrit of 25.7. Her white count was 12.7 and her glucose was 175 mg/dL. Her hemoglobin A1c was 8.4%. She has had no recent x-rays available for review. 07/31/2014. I was able to review the office visit notes from Dr. Delana [name] on 07/14/2014. The patient's ABI on the right was 0.85 and on the left was 0.89 and both were abnormal. His assessment and plan was that the patient had severe atherosclerotic changes of both lower extremities associated with ulceration and tissue loss of the foot and this represent a limb threatening ischemia and he recommended a angiography of the lower extremities with hope for intervention for limb salvage. This was scheduled the procedure was done on 07/22/14 The surgery performed was an abdominal aortogram with the right lower extremity runoff and catheter placement. 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 08/07/2014 x-rays were done of both feet on March 10. The right heel showed soft tissue wound, with some calcaneus lesion suspicious for osteomyelitis. An MRI would be recommended. the left heel did not show any evidence of osteomyelitis. 08/14/2014 -- Reports on [name] :app Cardiology  reports reviewed from the [facility] clinic by Dr. Nehemiah [name] 2-D echocardiography done on 08/09/2013 showed that her normal LV systolic function with ejection fraction of 55% She also had mild mitral and tricuspid insufficiency. 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 Culture report of the bone shows several organisms which are grown and she has been put on Bactrim DS one twice a day for 14 days. The patient's bariatric surgeon Dr. Duke [name] did speak with me today regarding the application of ACell and suggested that he would be happy to apply this in case the patient was ready for it. I had a detailed discussion with him regarding the patient's condition and I have recommended at the present time the patient would benefit from hyperbaric oxygen therapy, IV antibiotics and supportive wound care. if at some stage the wound was ready for application of a cell I would be happy to talk to him about taking over her care. He agreed with the treatment plan and said he would be in touch. Readmission: 11-15-2021 upon evaluation today patient presents for initial inspection here in our clinic concerning issues that she has been having with a wound on the plantar aspect of her right foot. I did review notes as well and it appears that she had a bone spur noted but no signs of osteomyelitis  this was in May when she saw Dr. Caryl [name]. With that being said he apparently was recommended surgery to go in and remove the bone spur as he feels like that is the causative reason for the wound and indeed I feel like that may be the case as well based on what I am seeing. Fortunately I do not see any evidence of active infection locally or systemically right now although there is definitely some callus buildup and there is definitely more fluid trapped underneath that is hard to even tell which is what we are  seeing currently working have to remove some of this callus in order to identify what is even going on underneath and how deep this really goes. The patient does have a history of diabetes mellitus type 2, diabetic neuropathy, and hypertension. Otherwise there are no major medical problems that would affect her wound healing. Her most recent hemoglobin A1c was on 06-16-2021 and is stated to be good at 6.7 11-29-2021 upon evaluation today patient appears to be doing well currently in regard to her wound. She has been tolerating the dressing changes without complication. Fortunately there does not appear to be any evidence of active infection locally or systemically which is great news. No fevers, chills, nausea, vomiting, or diarrhea. I do think the Hydrofera Blue rope is doing decently well. With that being said she does have a little callus that builds up I am after cleaning this away as well as cleaning some of the base of the wound as well. She does have her MRI scheduled for later today. 12-20-2021 upon evaluation today patient appears to be doing well currently in regard to her heel ulcer. This is actually significantly improved compared to previous evaluations. I do think that we are on the right track here and we are seeing some definitive improvements. Overall I think that the patient is on the right track. I do not see any evidence of active infection systemically and locally I feel like this is also doing much better. She has been on doxycycline which I placed her on beginning 11-30-2021. This will be for 2 months and she has not yet completed the first month.Marland Kitchen 01-03-2022 upon evaluation today patient appears to be doing well currently in regard to her wound. This is actually showing signs of excellent improvement is [name], [name] ([id number]) [id number] Page 7 of 9 noted completely to the surface there is just a little bit of biofilm although we are can have to clear  this away with sharp debridement as she is saline and gauze treatment is not enough to get this loosened today. 01-03-2022 upon evaluation today patient appears to be doing about the same in regard to her wound. The good news is she still on the antibiotics and nothing seems to be getting worse but notices that she is still not really showing a lot of improvement in the undermining area as of yet. Not really what we will try to work on here is get some undermining to reattach them to family. [name] that end I think that we may need to be packing this a little bit more tightly with the o collagen and doing it dry. If we do this then it will start to moisten and resolve and it will not actually be over packed but hopefully should allow for more significant healing going forward. Patient voiced understanding and is in agreement with that plan. 01-17-2022 upon evaluation today patient appears to be doing about the same with  regard to her wound. This is on the plantar aspect of the heel. With that being said it appears that this is not being packed as it should be. We needed to really be filling the undermining space with the collagen she had the dressing change this morning and that is definitely not what was there I think he might see some of the miscommunication in regards to the notes to home health. We will try to clarify that. 02-14-2022 upon evaluation today patient appears to be doing excellent in regard to her wound. She is actually tolerating the dressing changes without complication. Fortunately there does not appear to be any evidence of active infection locally or systemically at this time which is great news. No fevers, chills, nausea, vomiting, or diarrhea. I do believe that the collagen is doing a great job here. 02-28-2022 upon evaluation today patient appears to be doing well with regard to her wound although it may be that this was a little bit over packed with the collagen. We want it to be  filling the space but not overly fill in the space. With that being said I think in general she seems to be making some good progress here though and I do believe the collagen has been much better for her than what we were doing previous with the [facility]. 03-14-2022 upon evaluation patient's wound is actually showing signs of good granulation and epithelization at this point. Fortunately I think they were showing signs of significant improvement the undermining is better compared to what its been although we still do have some deeper areas especially toward the heel I do not feel any bone exposed which is good news. She is currently done with the doxycycline which I had her on for 2 months. We have seen dramatic improvement during that time and I am very pleased in that regard. With that being said she still does have some need for filling in of this undermining area and that is what we are using the collagen for at this point. 11/6; very difficult wound on the plantar right heel in this patient. She has an offloading walker but she says she only uses this to walk to go to the bathroom. Otherwise she is in her wheelchair at an assisted living facility. We have been using silver collagen 04-18-2022 upon evaluation today patient actually is making excellent improvement. I am very pleased with where things stand I do believe that we are headed in the right direction here. There does not appear to be any signs of infection locally or systemically at this time which is great news. 05-02-2022 upon evaluation today patient appears to be doing well currently in regard to her wound. She is actually showing signs of excellent improvement in overall I am extremely pleased with where things stand today. I do believe she is making excellent progress. I do not see any signs of active infection at this time which is great news. Objective Constitutional Well-nourished and well-hydrated in no acute  distress. Vitals Time Taken: 11:15 AM, Height: 67 in, Weight: 248 lbs, BMI: 38.8, Temperature: 98.0 [name], Pulse: 73 bpm, Respiratory Rate: 18 breaths/min, Blood Pressure: 144/84 mmHg. Respiratory normal breathing without difficulty. Psychiatric this patient is able to make decisions and demonstrates good insight into disease process. Alert and Oriented x 3. pleasant and cooperative. General Notes: Upon inspection patient's wound bed actually showed signs of good granulation epithelization at this point. Fortunately I see no evidence of infection locally nor systemically which  is great news and overall I am extremely pleased with where we stand today. Integumentary (Hair, Skin) Wound #5 status is Open. Original cause of wound was Pressure Injury. The date acquired was: 06/23/2021. The wound has been in treatment 24 weeks. The wound is located on the [place]. The wound measures 0.2cm length x 0.5cm width x 0.2cm depth; 0.079cm^2 area and 0.016cm^3 volume. There is Fat Layer (Subcutaneous Tissue) exposed. There is no tunneling or undermining noted. There is a medium amount of serosanguineous drainage noted. The wound margin is well defined and not attached to the wound base. There is large (67-100%) red, pale granulation within the wound bed. There is a small (1-33%) amount of necrotic tissue within the wound bed including Adherent Slough. 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 There was a Excisional  Skin/Subcutaneous Tissue Debridement with a total area of 0.5 sq cm performed by [name]., PA-C. With the following instrument(s): Curette to remove Viable and Non-Viable tissue/material. Material removed includes Callus, Subcutaneous Tissue, and Slough. A time out was conducted at 11:35, prior to the start of the procedure. A Minimum amount of bleeding was controlled with Pressure. The procedure was tolerated well. Post Debridement Measurements: 0.2cm length x 0.5cm width x 0.2cm depth; 0.016cm^3 volume. Character of Wound/Ulcer Post Debridement is stable. Severity of Tissue Post Debridement is: Fat layer exposed. Post procedure Diagnosis Wound #5: Same as Pre-Procedure Plan Follow-up Appointments: Return Appointment in 2 weeks. Nurse Visit as needed Home Health: [facility]: - [place] for wound care. May utilize formulary equivalent dressing for wound treatment orders unless otherwise specified. Home Health Nurse may visit PRN to address patientoos wound care needs. Scheduled days for dressing changes to be completed; exception, patient has scheduled wound care visit that day. **Please direct any NON-WOUND related issues/requests for orders to patient's Primary Care Physician. **If current dressing causes regression in wound condition, may D/C ordered dressing product/s and apply Normal Saline Moist Dressing daily until next [place] or Other MD appointment. **Notify [facility] of regression in wound condition at [phone]. Bathing/ Shower/ Hygiene: Wash wounds with antibacterial soap and water. May shower; gently cleanse wound with antibacterial soap, rinse and pat dry prior to dressing wounds No tub bath. Off-Loading: Other: - continue to wear crow walker boot to relieve pressure off wound WOUND #5: - Foot Wound Laterality: Plantar, Right Cleanser: [employer] Ancillary Kit - 15 Day Supply 3 x Per Week/30 Days Discharge  Instructions: Use supplies as instructed; Kit contains: (15) Saline Bullets; (15) 3x3 Gauze; 15 pr Gloves Cleanser: Soap and Water 3 x Per Week/30 Days Discharge Instructions: Gently cleanse wound with antibacterial soap, rinse and pat dry prior to dressing wounds Prim Dressing: Prisma 4.34 (in) (Home Health) 3 x Per Week/30 Days ary Discharge Instructions: PLEASE PACK DRY INTO WOUND (Undermining 12 oclock to 12 oclock , deepest point is at heel Secondary Dressing: (BORDER) Zetuvit Plus SILICONE BORDER Dressing 4x4 (in/in) (Home Health) 3 x Per Week/30 Days Discharge Instructions: Please do not put silicone bordered dressings under wraps. Use non-bordered dressing only. 1.  I am going to suggest that we have the patient continue to monitor for any signs of infection or worsening. Overall I am extremely pleased with where things are and I do believe that we are headed in the right direction. 2. I am also going to suggest that the patient should continue to monitor any signs of infection. I think she is making good progress this is very close to complete resolution I think the collagen is doing awesome. We will see patient back for reevaluation in 1 week here in the clinic. If anything worsens or changes patient will contact our office for additional recommendations. Electronic Signature(s) Signed: 05/02/2022 11:42:26 AM By: Daisy Keeler PA-C Entered By: [name] on 05/02/2022 11:42:25 -------------------------------------------------------------------------------- SuperBill Details Patient Name: Date of Service: Daisy [name], [name]. 05/02/2022 Medical Record Number: [id number] Patient Account Number: [id number] Date of Birth/Sex: Treating RN: 08/24/1950 (71 y.o. [name] Primary Care Provider: Denton [name] Other Clinician: Massie [name] Referring Provider: Treating Provider/Extender: [name] in Treatment: [address], [place] [name] ([id number])  [id number] Page 9 of 9 Diagnosis Coding ICD-10 Codes Code Description E11.621 Type 2 diabetes mellitus with foot ulcer L97.512 Non-pressure chronic ulcer of other part of right foot with fat layer exposed E11.43 Type 2 diabetes mellitus with diabetic autonomic (poly)neuropathy I10 Essential (primary) hypertension Facility Procedures : CPT4 Code: [id number] Description: 10175 - DEB SUBQ TISSUE 20 SQ CM/< ICD-10 Diagnosis Description L97.512 Non-pressure chronic ulcer of other part of right foot with fat layer exposed Modifier: Quantity: 1 Physician Procedures : CPT4 Code Description Modifier [id number] - WC PHYS SUBQ TISS 20 SQ CM ICD-10 Diagnosis Description L97.512 Non-pressure chronic ulcer of other part of right foot with fat layer exposed Quantity: 1 Electronic Signature(s) Signed: 05/02/2022 11:42:38 AM By: Daisy Keeler PA-C Entered By: [name] on 05/02/2022 11:42:37

## 2022-05-24 ENCOUNTER — Encounter: Payer: 59 | Attending: Physician Assistant | Admitting: Physician Assistant

## 2022-05-24 DIAGNOSIS — E1140 Type 2 diabetes mellitus with diabetic neuropathy, unspecified: Secondary | ICD-10-CM | POA: Insufficient documentation

## 2022-05-24 DIAGNOSIS — I10 Essential (primary) hypertension: Secondary | ICD-10-CM | POA: Insufficient documentation

## 2022-05-24 DIAGNOSIS — Z09 Encounter for follow-up examination after completed treatment for conditions other than malignant neoplasm: Secondary | ICD-10-CM | POA: Diagnosis present

## 2022-05-24 DIAGNOSIS — E1143 Type 2 diabetes mellitus with diabetic autonomic (poly)neuropathy: Secondary | ICD-10-CM | POA: Insufficient documentation

## 2022-05-24 DIAGNOSIS — Z8631 Personal history of diabetic foot ulcer: Secondary | ICD-10-CM | POA: Insufficient documentation

## 2022-05-24 DIAGNOSIS — Z6838 Body mass index (BMI) 38.0-38.9, adult: Secondary | ICD-10-CM | POA: Insufficient documentation

## 2022-05-24 NOTE — Progress Notes (Signed)
CAEDENCE, SNOWDEN ([id number]) [id number] Page 1 of 8 Visit Report for 05/24/2022 Chief Complaint Document Details Patient Name: Date of Service: [name], [name]. 05/24/2022 10:00 A M Medical Record Number: [id number] Patient Account Number: [id number] Date of Birth/Sex: Treating RN: 1950/12/04 (72 y.o. [name] Primary Care Provider: Denton [name] Other Clinician: Massie [name] Referring Provider: Treating Provider/Extender: [name] in Treatment: 27 Information Obtained from: Patient Chief Complaint Right Foot Ulcer Electronic Signature(s) Signed: 05/24/2022 10:34:04 AM By: [name] Entered By: [name] on 05/24/2022 10:34:04 -------------------------------------------------------------------------------- HPI Details Patient Name: Date of Service: [name], [name]. 05/24/2022 10:00 A M Medical Record Number: [id number] Patient Account Number: [id number] Date of Birth/Sex: Treating RN: 1950/07/17 (72 y.o. [name] Primary Care Provider: Denton [name] Other Clinician: Massie [name] Referring Provider: Treating Provider/Extender: [name] in Treatment: 27 History of Present Illness HPI Description: this is a 72 year old morbidly obese patient who has uncontrolled diabetes and has not been taking treatment for diabetes for at least 6 months. She presents with bilateral heel ulcers of only 2 weeks duration. He was sent to [place] by her PCP [name]. After this the patient was seen both by the podiatrist [name] and her vascular surgeon [name]. from what I understand from the son [name] has done some process on her and a procedure and says that he has finished working on her and no further vascular intervention is to be done. the patient was sent was for a further evaluation of her wounds. Her son has been doing some dressings at home on a twice a daily basis with Silvadene ointment.  Some recent blood work done on 07/19/2014 shows that her hemoglobin was 7.9 with a hematocrit of 25.7. Her white count was 12.7 and her glucose was 175 mg/dL. Her hemoglobin A1c was 8.4%. She has had no recent x-rays available for review. 07/31/2014. I was able to review the office visit notes from [name] on 07/14/2014. The patient's ABI on the right was 0.85 and on the left was 0.89 and both were abnormal. His assessment and plan was that the patient had severe atherosclerotic changes of both lower extremities associated with ulceration and tissue loss of the foot and this represent a limb threatening ischemia and he recommended a angiography of the lower extremities with hope for intervention for limb salvage. This was scheduled the procedure was done on 07/22/14 The surgery performed was an abdominal aortogram with the right lower extremity runoff and catheter placement. 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 [name] ([id number]) [id number] Page 2 of 8 08/07/2014 x-rays were done of both feet on March 10. The right heel showed soft tissue wound, with some calcaneus lesion suspicious for osteomyelitis. An MRI would be recommended. the left heel did not show any evidence of osteomyelitis. 08/14/2014 -- Reports on [name] :app Cardiology reports reviewed from the [facility] clinic by Dr. Nehemiah [name] 2-D echocardiography done on 08/09/2013 showed that her normal LV systolic function with ejection fraction of 55% She also had mild mitral and tricuspid insufficiency. 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 Culture report of the bone shows several organisms which are grown and she has been put on Bactrim DS one twice a day for 14 days. The patient's bariatric surgeon Dr. Duke [name] did speak with me today regarding the application of ACell and suggested that he would be happy to apply this in case the patient was ready for it. I had a detailed discussion with him regarding the patient's condition and I have recommended at the present time the patient would benefit from hyperbaric oxygen therapy, IV antibiotics and supportive wound care. if at some stage the wound was ready for application of a cell I would be happy to talk to him about taking over her care. He agreed with the treatment plan and said he would be in touch. Readmission: 11-15-2021 upon evaluation today patient presents for initial inspection here in our clinic concerning issues that she has been having with a wound on the plantar aspect of her right foot. I did review notes as well and it appears that she had a bone spur noted but no signs of osteomyelitis this was in May when she saw [name]. With that being said he apparently was recommended surgery to go in and remove the bone spur as he feels like that is the causative reason for the wound and indeed I feel like that may be the case as well based on what I am seeing. Fortunately I do not see any evidence of active infection locally or systemically right now although there is definitely some callus buildup and there is definitely more fluid trapped underneath that is hard to even tell which is what we are seeing currently working have to remove some of this callus in order to identify what is even going on underneath and how deep this really goes. The patient does have a history of diabetes mellitus type 2, diabetic neuropathy, and hypertension. Otherwise there are no major medical problems that would affect her wound healing. Her most  recent hemoglobin A1c was on 06-16-2021 and is stated to be good at 6.7 11-29-2021 upon evaluation today patient appears to be doing well currently in regard to her wound. She has been tolerating the dressing changes without complication. Fortunately there does not appear to be any evidence of active infection locally or systemically which is great news. No fevers, chills, nausea, vomiting, or diarrhea. I do think the Hydrofera Blue rope is doing decently well. With that being said she does have a little callus that builds up I am after cleaning this away as well as cleaning some of the base of the wound as well. She does have her MRI scheduled for later today. 12-20-2021 upon evaluation today patient appears to be doing well currently in regard to her heel ulcer. This is actually significantly improved compared to previous evaluations. I do think that we are on the right track here and we are seeing some definitive improvements. Overall I think that the patient is on the right track. I do not see any evidence of active infection systemically and locally I feel like this is also doing much better. She has been on doxycycline which I placed her on beginning 11-30-2021. This will be for 2 months and she has not yet completed the first month.Marland Kitchen 01-03-2022 upon evaluation today patient appears to be doing well currently in regard to her wound. This is actually showing signs of  excellent improvement is noted completely to the surface there is just a little bit of biofilm although we are can have to clear this away with sharp debridement as she is saline and gauze treatment is not enough to get this loosened today. 01-03-2022 upon evaluation today patient appears to be doing about the same in regard to her wound. The good news is she still on the antibiotics and nothing seems to be getting worse but notices that she is still not really showing a lot of improvement in the undermining area as of yet. Not really what  we will try to work on here is get some undermining to reattach them to family. [name] that end I think that we may need to be packing this a little bit more tightly with the o collagen and doing it dry. If we do this then it will start to moisten and resolve and it will not actually be over packed but hopefully should allow for more significant healing going forward. Patient voiced understanding and is in agreement with that plan. 01-17-2022 upon evaluation today patient appears to be doing about the same with regard to her wound. This is on the plantar aspect of the heel. With that being said it appears that this is not being packed as it should be. We needed to really be filling the undermining space with the collagen she had the dressing change this morning and that is definitely not what was there I think he might see some of the miscommunication in regards to the notes to home health. We will try to clarify that. 02-14-2022 upon evaluation today patient appears to be doing excellent in regard to her wound. She is actually tolerating the dressing changes without complication. Fortunately there does not appear to be any evidence of active infection locally or systemically at this time which is great news. No fevers, chills, nausea, vomiting, or diarrhea. I do believe that the collagen is doing a great job here. 02-28-2022 upon evaluation today patient appears to be doing well with regard to her wound although it may be that this was a little bit over packed with the collagen. We want it to be filling the space but not overly fill in the space. With that being said I think in general she seems to be making some good progress here though and I do believe the collagen has been much better for her than what we were doing previous with the [facility]. 03-14-2022 upon evaluation patient's wound is actually showing signs of good granulation and epithelization at this point. Fortunately I think they were  showing signs of significant improvement the undermining is better compared to what its been although we still do have some deeper areas especially toward the heel I do not feel any bone exposed which is good news. She is currently done with the doxycycline which I had her on for 2 months. We have seen dramatic improvement during that time and I am very pleased in that regard. With that being said she still does have some need for filling in of this undermining area and that is what we are using the collagen for at this point. 11/6; very difficult wound on the plantar right heel in this patient. She has an offloading walker but she says she only uses this to walk to go to the bathroom. Otherwise she is in her wheelchair at an assisted living facility. We have been using silver collagen 04-18-2022 upon evaluation today patient actually is  making excellent improvement. I am very pleased with where things stand I do believe that we are headed in the right direction here. There does not appear to be any signs of infection locally or systemically at this time which is great news. 05-02-2022 upon evaluation today patient appears to be doing well currently in regard to her wound. She is actually showing signs of excellent improvement in overall I am extremely pleased with where things stand today. I do believe she is making excellent progress. I do not see any signs of active infection at this time which is great news. 05-24-2021 upon evaluation today patient's wound actually appears to be completely healed which is great news and overall I am extremely pleased with where we stand today. Fortunately I see no signs of infection locally or systemically which is great news. No fevers, chills, nausea, vomiting, or diarrhea. Electronic Signature(s) Signed: 05/24/2022 2:22:45 PM By: [name] Entered By: [name] on 05/24/2022 14:22:45 [place], Mardelle [name] ([id number])  [id number] Page 3 of 8 -------------------------------------------------------------------------------- Physical Exam Details Patient Name: Date of Service: [name], [name]. 05/24/2022 10:00 A M Medical Record Number: [id number] Patient Account Number: [id number] Date of Birth/Sex: Treating RN: Nov 08, 1950 (72 y.o. [name] Primary Care Provider: Denton [name] Other Clinician: Massie [name] Referring Provider: Treating Provider/Extender: [name] in Treatment: 27 Constitutional Obese and well-hydrated in no acute distress. Respiratory normal breathing without difficulty. Psychiatric this patient is able to make decisions and demonstrates good insight into disease process. Alert and Oriented x 3. pleasant and cooperative. Notes Upon inspection patient's wound bed actually showed signs of good granulation and epithelization at this point. Fortunately I see no signs of active infection systemically nor locally which is great news overall I think she is completely healed. Electronic Signature(s) Signed: 05/24/2022 2:22:59 PM By: [name] Entered By: [name] on 05/24/2022 14:22:59 -------------------------------------------------------------------------------- Physician Orders Details Patient Name: Date of Service: [name], [name]. 05/24/2022 10:00 A M Medical Record Number: [id number] Patient Account Number: [id number] Date of Birth/Sex: Treating RN: 1951-02-15 (72 y.o. 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 Your wound has healed Moisturize legs daily after removing compression garments. - apply AandD ointment to foot and leg at night [name], [name] ([id number]) [id number] Page 4 of 8 Edema Control - Lymphedema / Segmental Compressive Device / Other Tubigrip single layer applied. - continue wearing Tubi grip D daily, remove at night Off-Loading Other: - continue to wear crow walker boot to relieve pressure off wound Additional Orders / Instructions Other: - continue to cover area with foam bandage for the next two weeks Electronic Signature(s) Signed: 05/24/2022 5:11:32 PM By: [name] Signed: 05/25/2022 2:03:30 PM By: [name] Entered By: [name] on 05/24/2022 10:40:23 -------------------------------------------------------------------------------- Problem List Details Patient Name: Date of Service: [name], [name]. 05/24/2022 10:00 [place] Record Number: [id number] Patient Account Number: [id number] Date of Birth/Sex: Treating RN: [birth year] (72 y.o. [name] Primary Care Provider: Denton [name] Other  Clinician: Massie [name] Referring Provider: Treating Provider/Extender: [name] in Treatment: 27 Active Problems ICD-10 Encounter Code Description Active Date MDM Diagnosis E11.621 Type 2 diabetes mellitus with foot ulcer 11/15/2021 No Yes L97.512 Non-pressure chronic ulcer of other part of right foot with fat layer exposed 11/15/2021 No Yes E11.43 Type 2 diabetes mellitus with diabetic autonomic (poly)neuropathy 11/15/2021 No Yes I10 Essential (primary) hypertension 11/15/2021 No Yes Inactive Problems Resolved Problems Electronic Signature(s) Signed: 05/24/2022 10:34:00 AM By: [name] Entered By: [name] on 05/24/2022 10:34:00 [place],  Jayliana [name] ([id number]) [id number] Page 5 of 8 -------------------------------------------------------------------------------- Progress Note Details Patient Name: Date of Service: [name], [name]. 05/24/2022 10:00 A M Medical Record Number: [id number] Patient Account Number: [id number] Date of Birth/Sex: Treating RN: [birth year] (71 y.o. [name] Primary Care Provider: Denton [name] Other Clinician: Massie [name] Referring Provider: Treating Provider/Extender: [name] in Treatment: 27 Subjective Chief Complaint Information obtained from Patient Right Foot Ulcer History of Present Illness (HPI) this is a 72 year old morbidly obese patient who has uncontrolled diabetes and has not been taking treatment for diabetes for at least 6 months. She presents with bilateral heel ulcers of only 2 weeks duration. He was sent to [place] by her PCP [name]. After this the patient was seen both by the podiatrist [name] and her vascular surgeon [name]. from what I understand from the son [name] has done some process on her and a procedure and says that he has finished working on her and no further vascular intervention is to be done. the patient was sent was for a further evaluation of her wounds. Her son has been doing some dressings at home on a twice a daily basis with Silvadene ointment. Some recent blood work done on 07/19/2014 shows that her hemoglobin was 7.9 with a hematocrit of 25.7. Her white count was 12.7 and her glucose was 175 mg/dL. Her hemoglobin A1c was 8.4%. She has had no recent x-rays available for review. 07/31/2014. I was able to review the office visit notes from [name] on 07/14/2014. The patient's ABI on the right was 0.85 and on the left was 0.89 and both were abnormal. His assessment and plan was that the patient had severe atherosclerotic changes of both lower extremities associated with ulceration and tissue  loss of the foot and this represent a limb threatening ischemia and he recommended a angiography of the lower extremities with hope for intervention for limb salvage. This was scheduled the procedure was done on 07/22/14 The surgery performed was an abdominal aortogram with the right lower extremity runoff and catheter placement. 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 08/07/2014 x-rays were done of both feet on March 10. The right heel showed soft tissue wound, with some calcaneus lesion suspicious for osteomyelitis. An MRI would be recommended. the left heel did not show any evidence of osteomyelitis. 08/14/2014 -- Reports on [name] :app Cardiology reports reviewed from the [facility] clinic by Dr. Nehemiah [name] 2-D echocardiography done on 08/09/2013 showed that her normal LV systolic function with ejection fraction of 55% She also had mild mitral and tricuspid insufficiency.  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 Culture report of the bone shows several organisms which are grown and she has been put on Bactrim DS one twice a day for 14 days. The patient's bariatric surgeon Dr. Duke [name] did speak with me today regarding the application of ACell and suggested that he would be happy to apply this in case the patient was ready for it. I had a detailed discussion with him regarding the patient's condition and I have recommended at the present time the patient would benefit from hyperbaric oxygen therapy, IV antibiotics and supportive wound care. if at some stage the wound was ready for application  of a cell I would be happy to talk to him about taking over her care. He agreed with the treatment plan and said he would be in touch. Readmission: 11-15-2021 upon evaluation today patient presents for initial inspection here in our clinic concerning issues that she has been having with a wound on the plantar aspect of her right foot. I did review notes as well and it appears that she had a bone spur noted but no signs of osteomyelitis this was in May when she saw [name]. With that being said he apparently was recommended surgery to go in and remove the bone spur as he feels like that is the causative reason for the wound and indeed I feel like that may be the case as well based on what I am seeing. Fortunately I do not see any evidence of active infection locally or systemically right now although there is definitely some callus buildup and there is definitely more fluid trapped underneath that is hard to even tell which is what we are seeing currently working have to remove some of this callus in order to identify what is even going on underneath and how deep this really goes. The patient does have a history of diabetes mellitus type 2, diabetic neuropathy, and hypertension. Otherwise there are no major medical problems that would affect her wound healing. Her most recent hemoglobin A1c was on 06-16-2021 and is stated to be good at 6.7 11-29-2021 upon evaluation today patient appears to be doing well currently in regard to her wound. She has been tolerating the dressing changes without complication. Fortunately there does not appear to be any evidence of active infection locally or systemically which is great news. No fevers, chills, nausea, vomiting, or diarrhea. I do think the Hydrofera Blue rope is doing decently well. With that being said she does have a little callus that builds up I am after cleaning this away as well as cleaning some of the base of the wound as well. She does have her MRI  scheduled for later today. 12-20-2021 upon evaluation today patient appears to be doing well currently in regard to her heel ulcer. This is actually significantly improved compared to previous evaluations. I do think that we are on the right track here and we are seeing some definitive improvements. Overall I think that the patient is on the right track. I do not see any evidence of active infection systemically and locally I feel like this is also doing much better. She has been on doxycycline which I placed her on beginning 11-30-2021. This will be for 2 months and she has not yet completed the first month.Marland Kitchen 01-03-2022 upon evaluation today patient appears to be doing well currently in regard to  her wound. This is actually showing signs of excellent improvement is [place], [name] ([id number]) [id number] Page 6 of 8 noted completely to the surface there is just a little bit of biofilm although we are can have to clear this away with sharp debridement as she is saline and gauze treatment is not enough to get this loosened today. 01-03-2022 upon evaluation today patient appears to be doing about the same in regard to her wound. The good news is she still on the antibiotics and nothing seems to be getting worse but notices that she is still not really showing a lot of improvement in the undermining area as of yet. Not really what we will try to work on here is get some undermining to reattach them to family. [name] that end I think that we may need to be packing this a little bit more tightly with the o collagen and doing it dry. If we do this then it will start to moisten and resolve and it will not actually be over packed but hopefully should allow for more significant healing going forward. Patient voiced understanding and is in agreement with that plan. 01-17-2022 upon evaluation today patient appears to be doing about the same with regard to her wound. This is on the plantar aspect  of the heel. With that being said it appears that this is not being packed as it should be. We needed to really be filling the undermining space with the collagen she had the dressing change this morning and that is definitely not what was there I think he might see some of the miscommunication in regards to the notes to home health. We will try to clarify that. 02-14-2022 upon evaluation today patient appears to be doing excellent in regard to her wound. She is actually tolerating the dressing changes without complication. Fortunately there does not appear to be any evidence of active infection locally or systemically at this time which is great news. No fevers, chills, nausea, vomiting, or diarrhea. I do believe that the collagen is doing a great job here. 02-28-2022 upon evaluation today patient appears to be doing well with regard to her wound although it may be that this was a little bit over packed with the collagen. We want it to be filling the space but not overly fill in the space. With that being said I think in general she seems to be making some good progress here though and I do believe the collagen has been much better for her than what we were doing previous with the [facility]. 03-14-2022 upon evaluation patient's wound is actually showing signs of good granulation and epithelization at this point. Fortunately I think they were showing signs of significant improvement the undermining is better compared to what its been although we still do have some deeper areas especially toward the heel I do not feel any bone exposed which is good news. She is currently done with the doxycycline which I had her on for 2 months. We have seen dramatic improvement during that time and I am very pleased in that regard. With that being said she still does have some need for filling in of this undermining area and that is what we are using the collagen for at this point. 11/6; very difficult wound on the  plantar right heel in this patient. She has an offloading walker but she says she only uses this to walk to go to the bathroom. Otherwise she is in her wheelchair at  an assisted living facility. We have been using silver collagen 04-18-2022 upon evaluation today patient actually is making excellent improvement. I am very pleased with where things stand I do believe that we are headed in the right direction here. There does not appear to be any signs of infection locally or systemically at this time which is great news. 05-02-2022 upon evaluation today patient appears to be doing well currently in regard to her wound. She is actually showing signs of excellent improvement in overall I am extremely pleased with where things stand today. I do believe she is making excellent progress. I do not see any signs of active infection at this time which is great news. 05-24-2021 upon evaluation today patient's wound actually appears to be completely healed which is great news and overall I am extremely pleased with where we stand today. Fortunately I see no signs of infection locally or systemically which is great news. No fevers, chills, nausea, vomiting, or diarrhea. Objective Constitutional Obese and well-hydrated in no acute distress. Vitals Time Taken: 10:18 AM, Height: 67 in, Weight: 248 lbs, BMI: 38.8, Temperature: 98.2 [name], Pulse: 80 bpm, Respiratory Rate: 18 breaths/min, Blood Pressure: 153/90 mmHg. Respiratory normal breathing without difficulty. Psychiatric this patient is able to make decisions and demonstrates good insight into disease process. Alert and Oriented x 3. pleasant and cooperative. General Notes: Upon inspection patient's wound bed actually showed signs of good granulation and epithelization at this point. Fortunately I see no signs of active infection systemically nor locally which is great news overall I think she is completely healed. Integumentary (Hair, Skin) Wound #5 status is  Healed - Epithelialized. Original cause of wound was Pressure Injury. The date acquired was: 06/23/2021. The wound has been in treatment 27 weeks. The wound is located on the [place]. The wound measures 0cm length x 0cm width x 0cm depth; 0cm^2 area and 0cm^3 volume. There is Fat Layer (Subcutaneous Tissue) exposed. There is no tunneling or undermining noted. There is a none present amount of drainage noted. The wound margin is well defined and not attached to the wound base. There is large (67-100%) red, pale granulation within the wound bed. There is a small (1-33%) amount of necrotic tissue within the wound bed. 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 Your wound has healed Moisturize legs daily after removing compression garments. - apply AandD ointment to foot and leg at night Edema Control - Lymphedema / Segmental Compressive Device / Other: Tubigrip single layer applied. - continue wearing Tubi grip D daily, remove at night Off-Loading: Other: - continue to wear crow walker boot to relieve pressure off wound Additional Orders / Instructions: Other: - continue to cover area with foam bandage for the next two weeks 1. I am going to recommend that we going discontinue wound care services as the patient appears to be completely healed and this is great news. 2. I am also can recommend she should continue to wear the [facility] walker boot which I think has been beneficial. 3. She should also continue with the Tubigrip to keep edema under control I think that is been very beneficial. We will see  the patient back for follow-up visit as needed.  Electronic Signature(s) Signed: 05/24/2022 2:23:27 PM By: [name] Entered By: [name] on 05/24/2022 14:23:27 -------------------------------------------------------------------------------- SuperBill Details Patient Name: Date of Service: [name], [name]. 05/24/2022 Medical Record Number: [id number] Patient Account Number: [id number] Date of Birth/Sex: Treating RN: July 26, 1950 (72 y.o. [name] Primary Care Provider: Denton [name] Other Clinician: Massie [name] Referring Provider: Treating Provider/Extender: [name] in Treatment: 27 Diagnosis Coding ICD-10 Codes Code Description E11.621 Type 2 diabetes mellitus with foot ulcer L97.512 Non-pressure chronic ulcer of other part of right foot with fat layer exposed E11.43 Type 2 diabetes mellitus with diabetic autonomic (poly)neuropathy I10 Essential (primary) hypertension Facility Procedures : CPT4 Code: [id number] Description: [phone] - WOUND CARE VISIT-LEV 2 EST PT Modifier: Quantity: 1 Physician Procedures : CPT4 Code Description Modifier [id number] - WC PHYS LEVEL 3 - EST PT ICD-10 Diagnosis Description E11.621 Type 2 diabetes mellitus with foot ulcer L97.512 Non-pressure chronic ulcer of other part of right foot with fat layer exposed E11.43 Type 2  diabetes mellitus with diabetic autonomic (poly)neuropathy I10 Essential (primary) hypertension Barba, Fadia [name] ([id number]) [id number] Quantity: 1 Page 8 of 8 Electronic Signature(s) Signed: 05/24/2022 2:23:38 PM By: [name] Entered By: [name] on 05/24/2022 14:23:38

## 2022-05-25 NOTE — Progress Notes (Signed)
YANCI, BACHTELL [name] ([id number]) [id number] Page 1 of 8 Visit Report for 05/24/2022 Arrival Information Details Patient Name: Date of Service: [name], [name]. 05/24/2022 10:00 A M Medical Record Number: [id number] Patient Account Number: [id number] Date of Birth/Sex: Treating RN: 1951-05-14 (72 y.o. Marlowe Shores Primary Care Tula Schryver: Denton Lank Other Clinician: Massie Kluver Referring Ulyana Pitones: Treating Tyreisha Ungar/Extender: Almedia Balls in Treatment: 27 Visit Information History Since Last Visit All ordered tests and consults were completed: No Patient Arrived: Wheel Chair Added or deleted any medications: No Arrival Time: 10:17 Any new allergies or adverse reactions: No Transfer Assistance: EasyPivot Patient Lift Had a fall or experienced change in No Patient Identification Verified: Yes activities of daily living that may affect Secondary Verification Process Completed: Yes risk of falls: Patient Requires Transmission-Based No Signs or symptoms of abuse/neglect since last visito No Precautions: Hospitalized since last visit: No Patient Has Alerts: Yes Implantable device outside of the clinic excluding No Patient Alerts: Patient on Blood Thinner cellular tissue based products placed in the center ABI 11/15/21 L 1.17 R since last visit: 1.27 Has Dressing in Place as Prescribed: Yes Has Footwear/Offloading in Place as Prescribed: Yes Left: Other:offloading walker boot Pain Present Now: No Electronic Signature(s) Signed: 05/25/2022 2:03:30 PM By: Massie Kluver Entered By: Massie Kluver on 05/24/2022 10:18:35 -------------------------------------------------------------------------------- Clinic Level of Care Assessment Details Patient Name: Date of Service: Daisy [name], [name]. 05/24/2022 10:00 [place] Record Number: [id number] Patient Account Number: [id number] Date of Birth/Sex: Treating RN: March 21, 1951 (71 y.o. Marlowe Shores Primary  Care Lanaysia Fritchman: Denton Lank Other Clinician: Massie Kluver Referring Ikeisha Blumberg: Treating Asha Grumbine/Extender: Almedia Balls in Treatment: [place] Assessment Items TOOL 4 Quantity Score '[]'$  - 0 Use when only an EandM is performed on FOLLOW-UP visit ASSESSMENTS - Nursing Assessment / Reassessment X- 1 10 Reassessment of Co-morbidities (includes updates in patient status) Speros, Stephanny [name] ([id number]) [id number] Page 2 of 8 X- 1 5 Reassessment of Adherence to Treatment Plan ASSESSMENTS - Wound and Skin A ssessment / Reassessment X - Simple Wound Assessment / Reassessment - one wound 1 5 '[]'$  - 0 Complex Wound Assessment / Reassessment - multiple wounds '[]'$  - 0 Dermatologic / Skin Assessment (not related to wound area) ASSESSMENTS - Focused Assessment '[]'$  - 0 Circumferential Edema Measurements - multi extremities '[]'$  - 0 Nutritional Assessment / Counseling / Intervention '[]'$  - 0 Lower Extremity Assessment (monofilament, tuning fork, pulses) '[]'$  - 0 Peripheral Arterial Disease Assessment (using hand held doppler) ASSESSMENTS - Ostomy and/or Continence Assessment and Care '[]'$  - 0 Incontinence Assessment and Management '[]'$  - 0 Ostomy Care Assessment and Management (repouching, etc.) PROCESS - Coordination of Care X - Simple Patient / Family Education for ongoing care 1 15 '[]'$  - 0 Complex (extensive) Patient / Family Education for ongoing care '[]'$  - 0 Staff obtains Programmer, systems, Records, T Results / Process Orders est '[]'$  - 0 Staff telephones HHA, Nursing Homes / Clarify orders / etc '[]'$  - 0 Routine Transfer to another Facility (non-emergent condition) '[]'$  - 0 Routine Hospital Admission (non-emergent condition) '[]'$  - 0 New Admissions / Biomedical engineer / Ordering NPWT Apligraf, etc. , '[]'$  - 0 Emergency Hospital Admission (emergent condition) X- 1 10 Simple Discharge Coordination '[]'$  - 0 Complex (extensive) Discharge  Coordination PROCESS - Special Needs '[]'$  - 0 Pediatric / Minor Patient Management '[]'$  - 0 Isolation Patient Management '[]'$  - 0 Hearing / Language / Visual special needs '[]'$  - 0 Assessment of  Community assistance (transportation, D/C planning, etc.) '[]'$  - 0 Additional assistance / Altered mentation '[]'$  - 0 Support Surface(s) Assessment (bed, cushion, seat, etc.) INTERVENTIONS - Wound Cleansing / Measurement X - Simple Wound Cleansing - one wound 1 5 '[]'$  - 0 Complex Wound Cleansing - multiple wounds X- 1 5 Wound Imaging (photographs - any number of wounds) '[]'$  - 0 Wound Tracing (instead of photographs) '[]'$  - 0 Simple Wound Measurement - one wound '[]'$  - 0 Complex Wound Measurement - multiple wounds INTERVENTIONS - Wound Dressings X - Small Wound Dressing one or multiple wounds 1 10 '[]'$  - 0 Medium Wound Dressing one or multiple wounds '[]'$  - 0 Large Wound Dressing one or multiple wounds X- 1 5 Application of Medications - topical '[]'$  - 0 Application of Medications - injection Mcgraw, Kaija [name] ([id number]) [id number] Page 3 of 8 INTERVENTIONS - Miscellaneous '[]'$  - 0 External ear exam '[]'$  - 0 Specimen Collection (cultures, biopsies, blood, body fluids, etc.) '[]'$  - 0 Specimen(s) / Culture(s) sent or taken to Lab for analysis '[]'$  - 0 Patient Transfer (multiple staff / Harrel Lemon Lift / Similar devices) '[]'$  - 0 Simple Staple / Suture removal (25 or less) '[]'$  - 0 Complex Staple / Suture removal (26 or more) '[]'$  - 0 Hypo / Hyperglycemic Management (close monitor of Blood Glucose) '[]'$  - 0 Ankle / Brachial Index (ABI) - do not check if billed separately X- 1 5 Vital Signs Has the patient been seen at the hospital within the last three years: Yes Total Score: 75 Level Of Care: New/Established - Level 2 Electronic Signature(s) Signed: 05/25/2022 2:03:30 PM By: Massie Kluver Entered By: Massie Kluver on 05/24/2022  10:53:55 -------------------------------------------------------------------------------- Encounter Discharge Information Details Patient Name: Date of Service: Daisy PP, [name]. 05/24/2022 10:00 [place] Record Number: [id number] Patient Account Number: [id number] Date of Birth/Sex: Treating RN: [birth year] (72 y.o. Marlowe Shores Primary Care Norvella Loscalzo: Denton Lank Other Clinician: Massie Kluver Referring Ramel Tobon: Treating Kassadee Carawan/Extender: Almedia Balls in Treatment: 27 Encounter Discharge Information Items Discharge Condition: Stable Ambulatory Status: Wheelchair Discharge Destination: Home Transportation: Other Accompanied By: self Schedule Follow-up Appointment: Yes Clinical Summary of Care: Electronic Signature(s) Signed: 05/25/2022 2:03:30 PM By: Massie Kluver Entered By: Massie Kluver on 05/24/2022 10:55:52 Probasco, Klair [name] ([id number]) [id number] Page 4 of 8 -------------------------------------------------------------------------------- Lower Extremity Assessment Details Patient Name: Date of Service: Daisy [place]. 05/24/2022 10:00 A M Medical Record Number: [id number] Patient Account Number: [id number] Date of Birth/Sex: Treating RN: July 15, 1950 (72 y.o. Marlowe Shores Primary Care Tome Wilson: Denton Lank Other Clinician: Massie Kluver Referring Tangelia Sanson: Treating Mckala Pantaleon/Extender: Almedia Balls in Treatment: 27 Vascular Assessment Pulses: Dorsalis Pedis Palpable: [Right:Yes] Electronic Signature(s) Signed: 05/25/2022 2:03:30 PM By: Massie Kluver Signed: 05/25/2022 4:52:31 PM By: Gretta Cool, BSN, RN, CWS, [name] RN, BSN Entered By: Massie Kluver on 05/24/2022 10:28:12 -------------------------------------------------------------------------------- Multi Wound Chart Details Patient Name: Date of Service: Daisy PP, [name]. 05/24/2022 10:00 A M Medical Record Number: [id number] Patient Account Number:  [id number] Date of Birth/Sex: Treating RN: [birth year] (72 y.o. Mar[place]e [place] Primary Care Coree Riester: Denton Lank Other Clinician: Massie Kluver Referring Lameshia Hypolite: Treating Symphoni Helbling/Extender: Almedia Balls in Treatment: 27 Vital Signs Height(in): 27 Pulse(bpm): 80 Weight(lbs): 248 Blood Pressure(mmHg): 153/90 Body Mass Index(BMI): 38.8 Temperature([name]): 98.2 Respiratory Rate(breaths/min): 18 [5:Photos:] [N/A:N/A] Right, Plantar Foot N/A N/A Wound Location: Pressure Injury N/A N/A Wounding Event: Diabetic Wound/Ulcer of the Lower N/A N/A Primary Etiology: Extremity Cataracts, Anemia, Hypertension, N/A N/A Comorbid History:  Type II Diabetes, Osteoarthritis, Osteomyelitis, Neuropathy, Confinement Anxiety 06/23/2021 N/A N/A Date Acquired: 48 N/A N/A Weeks of Treatment: Open N/A N/A Wound Status: No N/A N/A Wound Recurrence: 0.1x0.1x0.1 N/A N/A Measurements L x W x D (cm) 0.008 N/A N/A A (cm) : NAMIAH, DUNNAVANT [name] ([id number]) [id number] Page 5 of 8 0.001 N/A N/A Volume (cm) : 88.70% N/A N/A % Reduction in A rea: 92.90% N/A N/A % Reduction in Volume: Grade 3 N/A N/A Classification: Medium N/A N/A Exudate A mount: Serosanguineous N/A N/A Exudate Type: red, brown N/A N/A Exudate Color: Well defined, not attached N/A N/A Wound Margin: Large (67-100%) N/A N/A Granulation A mount: Red, Pale N/A N/A Granulation Quality: Small (1-33%) N/A N/A Necrotic A mount: Fat Layer (Subcutaneous Tissue): Yes N/A N/A Exposed Structures: Small (1-33%) N/A N/A Epithelialization: Treatment Notes Electronic Signature(s) Signed: 05/25/2022 2:03:30 PM By: Massie Kluver Entered By: Massie Kluver on 05/24/2022 10:28:16 -------------------------------------------------------------------------------- Multi-Disciplinary Care Plan Details Patient Name: Date of Service: Daisy PP, [name]. 05/24/2022 10:00 A M Medical Record Number:  [id number] Patient Account Number: [id number] Date of Birth/Sex: Treating RN: [birth year] (72 y.o. Marlowe Shores Primary Care Annmarie Plemmons: Denton Lank Other Clinician: Massie Kluver Referring Jeff Frieden: Treating Cheryn Lundquist/Extender: Almedia Balls in Treatment: 27 Active Inactive Electronic Signature(s) Signed: 05/25/2022 2:03:30 PM By: Massie Kluver Signed: 05/25/2022 4:52:31 PM By: Gretta Cool BSN, RN, CWS, [name] RN, BSN Entered By: Massie Kluver on 05/24/2022 10:55:10 -------------------------------------------------------------------------------- Pain Assessment Details Patient Name: Date of Service: Daisy PP, [name]. 05/24/2022 10:00 A M Medical Record Number: [id number] Patient Account Number: [id number] Date of Birth/Sex: Treating RN: Jan 12, 1951 (72 y.o. Marlowe Shores Primary Care Latice Waitman: Denton Lank Other Clinician: Massie Kluver Referring Leviticus Harton: Treating Jeannine Pennisi/Extender: Almedia Balls in Treatment: [address], [name] ([id number]) [id number] Page 6 of 8 Active Problems Location of Pain Severity and Description of Pain Patient Has Paino No Site Locations Pain Management and Medication Current Pain Management: Electronic Signature(s) Signed: 05/25/2022 2:03:30 PM By: Massie Kluver Signed: 05/25/2022 4:52:31 PM By: Gretta Cool, BSN, RN, CWS, [name] RN, BSN Entered By: Massie Kluver on 05/24/2022 10:21:20 -------------------------------------------------------------------------------- Patient/Caregiver Education Details Patient Name: Date of Service: Daisy PP, [name]. 1/2/2024andnbsp10:00 A M Medical Record Number: [id number] Patient Account Number: [id number] Date of Birth/Gender: Treating RN: [birth year] (72 y.o. Marlowe Shores Primary Care Physician: Denton Lank Other Clinician: Massie Kluver Referring Physician: Treating Physician/Extender: Almedia Balls in Treatment: 27 Education Assessment Education  Provided To: Patient Education Topics Provided Wound/Skin Impairment: Handouts: Other: your wound has healed. Please call if any further issues arise Methods: Explain/Verbal Responses: State content correctly Electronic Signature(s) Signed: 05/25/2022 2:03:30 PM By: Massie Kluver Entered By: Massie Kluver on 05/24/2022 10:54:34 Seeney, [name] ([id number]) [id number] Page 7 of 8 -------------------------------------------------------------------------------- Wound Assessment Details Patient Name: Date of Service: Daisy [place]. 05/24/2022 10:00 A M Medical Record Number: [id number] Patient Account Number: [id number] Date of Birth/Sex: Treating RN: [birth year] (72 y.o. [name], [name] Primary Care Trei Schoch: Denton Lank Other Clinician: Massie Kluver Referring Fairley Copher: Treating Sonya Pucci/Extender: Almedia Balls in Treatment: 27 Wound Status Wound Number: 5 Primary Diabetic Wound/Ulcer of the Lower Extremity Etiology: Wound Location: Right, Plantar Foot Wound Healed - Epithelialized Wounding Event: Pressure Injury Status: Date Acquired: 06/23/2021 Comorbid Cataracts, Anemia, Hypertension, Type II Diabetes, Osteoarthritis, Weeks Of Treatment: 27 History: Osteomyelitis, Neuropathy, Confinement Anxiety Clustered Wound: No Photos Wound Measurements Length: (cm) Width: (cm) Depth: (cm) Area: (cm) Volume: (cm) 0 % Reduction in  Area: 100% 0 % Reduction in Volume: 100% 0 Epithelialization: Large (67-100%) 0 Tunneling: No 0 Undermining: No Wound Description Classification: Grade 3 Wound Margin: Well defined, not attached Exudate Amount: None Present Foul Odor After Cleansing: No Slough/Fibrino No Wound Bed Granulation Amount: Large (67-100%) Exposed Structure Granulation Quality: Red, Pale Fat Layer (Subcutaneous Tissue) Exposed: Yes Necrotic Amount: Small (1-33%) Treatment Notes Wound #5 (Foot) Wound Laterality: Plantar,  Right Cleanser Peri-Wound Care Topical Primary Dressing Secondary Dressing Secured With Friedt, Francis Gaines ([id number]) [id number] Page 8 of 8 Compression Wrap Compression Stockings Add-Ons Electronic Signature(s) Signed: 05/25/2022 2:03:30 PM By: Massie Kluver Signed: 05/25/2022 4:52:31 PM By: Gretta Cool, BSN, RN, CWS, [name] RN, BSN Entered By: Massie Kluver on 05/24/2022 10:36:42 -------------------------------------------------------------------------------- Vitals Details Patient Name: Date of Service: Daisy PP, [name]. 05/24/2022 10:00 A M Medical Record Number: [id number] Patient Account Number: [id number] Date of Birth/Sex: Treating RN: 1950-09-03 (72 y.o. [name], [name] Primary Care Eryx Zane: Denton Lank Other Clinician: Massie Kluver Referring Lauralee Waters: Treating Lorie Cleckley/Extender: Almedia Balls in Treatment: 27 Vital Signs Time Taken: 10:18 Temperature ([name]): 98.2 Height (in): 67 Pulse (bpm): 80 Weight (lbs): 248 Respiratory Rate (breaths/min): 18 Body Mass Index (BMI): 38.8 Blood Pressure (mmHg): 153/90 Reference Range: 80 - 120 mg / dl Electronic Signature(s) Signed: 05/25/2022 2:03:30 PM By: Massie Kluver Entered By: Massie Kluver on 05/24/2022 10:21:17

## 2022-06-20 ENCOUNTER — Other Ambulatory Visit: Payer: Self-pay | Admitting: Family Medicine

## 2022-06-20 DIAGNOSIS — Z1231 Encounter for screening mammogram for malignant neoplasm of breast: Secondary | ICD-10-CM

## 2022-11-29 ENCOUNTER — Ambulatory Visit
Admission: RE | Admit: 2022-11-29 | Discharge: 2022-11-29 | Disposition: A | Discharge status: Home / Self Care | Payer: 59 | Source: Ambulatory Visit | Attending: Family Medicine | Admitting: Family Medicine

## 2022-11-29 DIAGNOSIS — Z1231 Encounter for screening mammogram for malignant neoplasm of breast: Secondary | ICD-10-CM | POA: Diagnosis present

## 2023-08-11 IMAGING — CR DG LUMBAR SPINE COMPLETE 4+V
1 series · 5 of 5 positions shown · non-contrast
Comparison: 02/17/2020

CLINICAL DATA: Low back pain without trauma

EXAM:
LUMBAR SPINE - COMPLETE 4+ VIEW

[Series 1: dg lumbar spine complete 4 +v · 0.14mm/px · 5 of 5 slices shown]
[im 1/5]
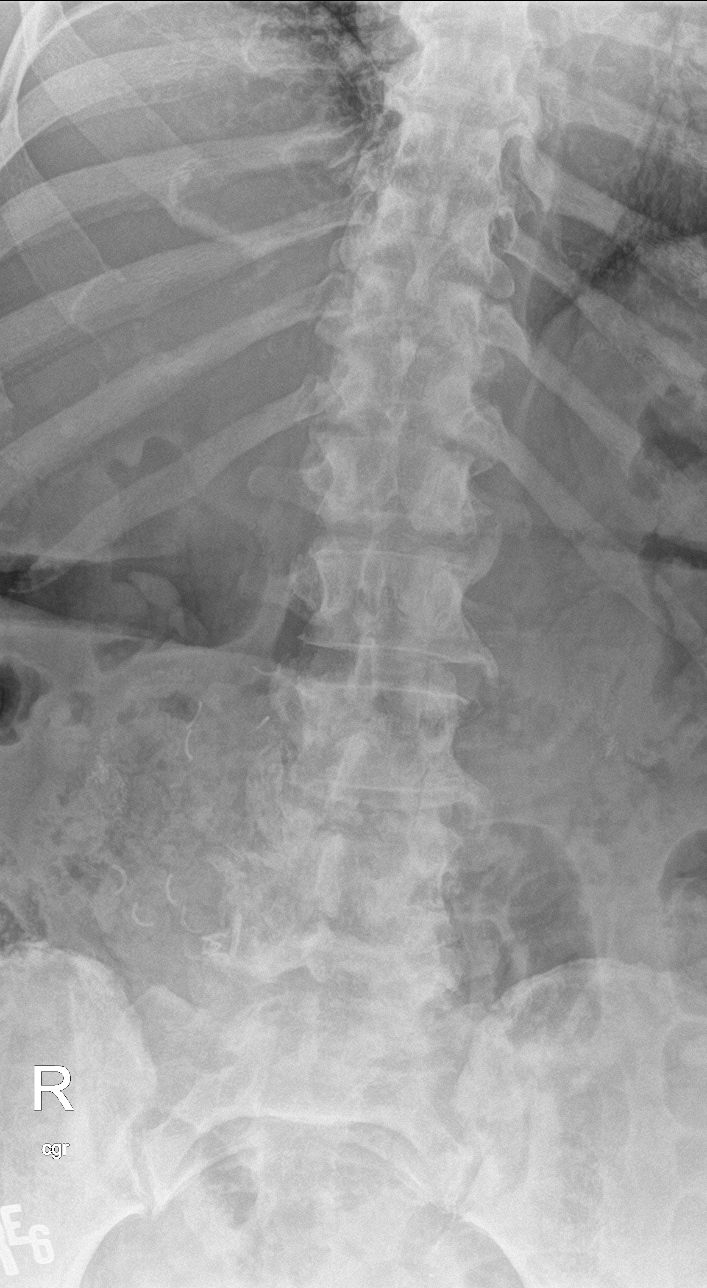
[im 2/5]
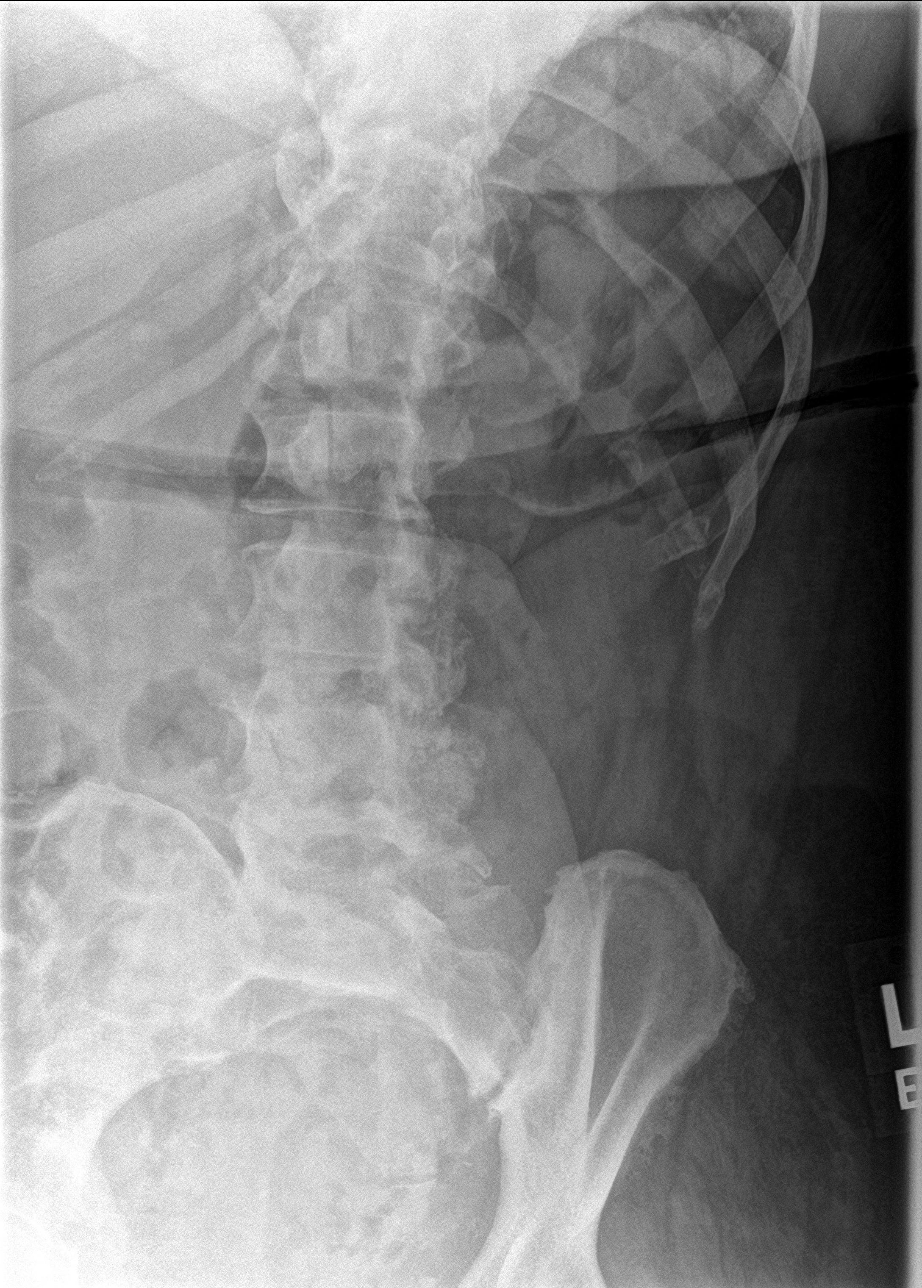
[im 3/5]
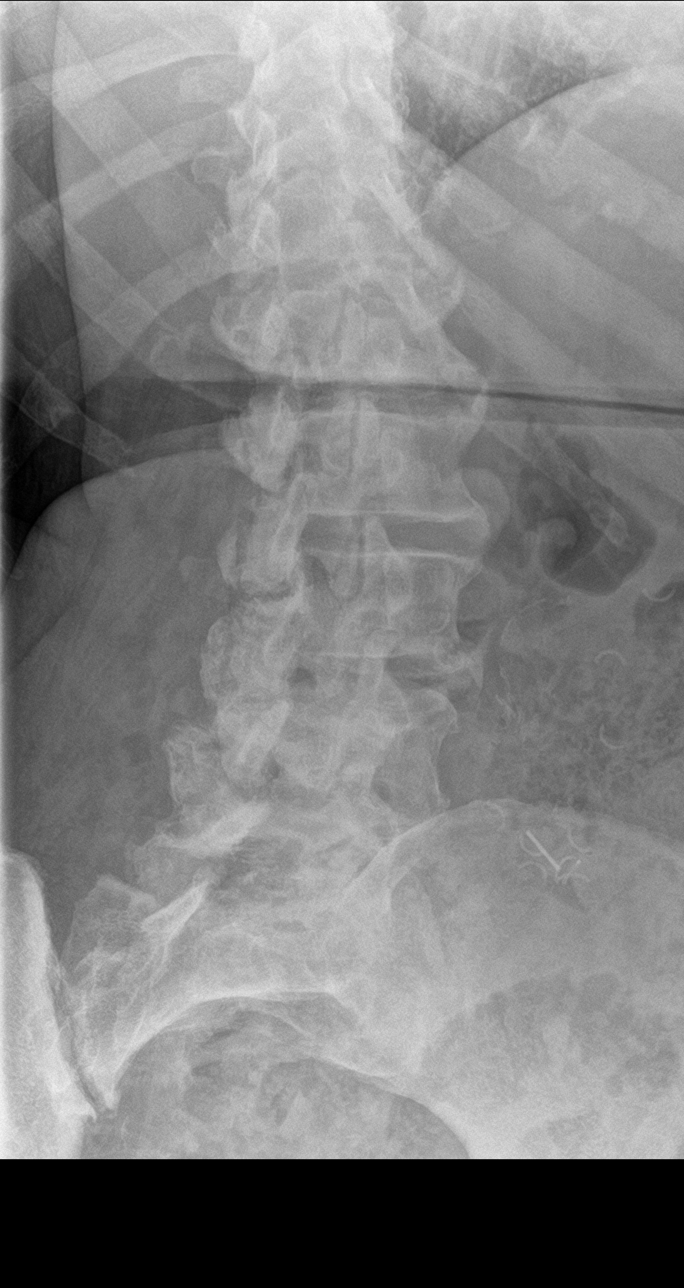
[im 4/5]
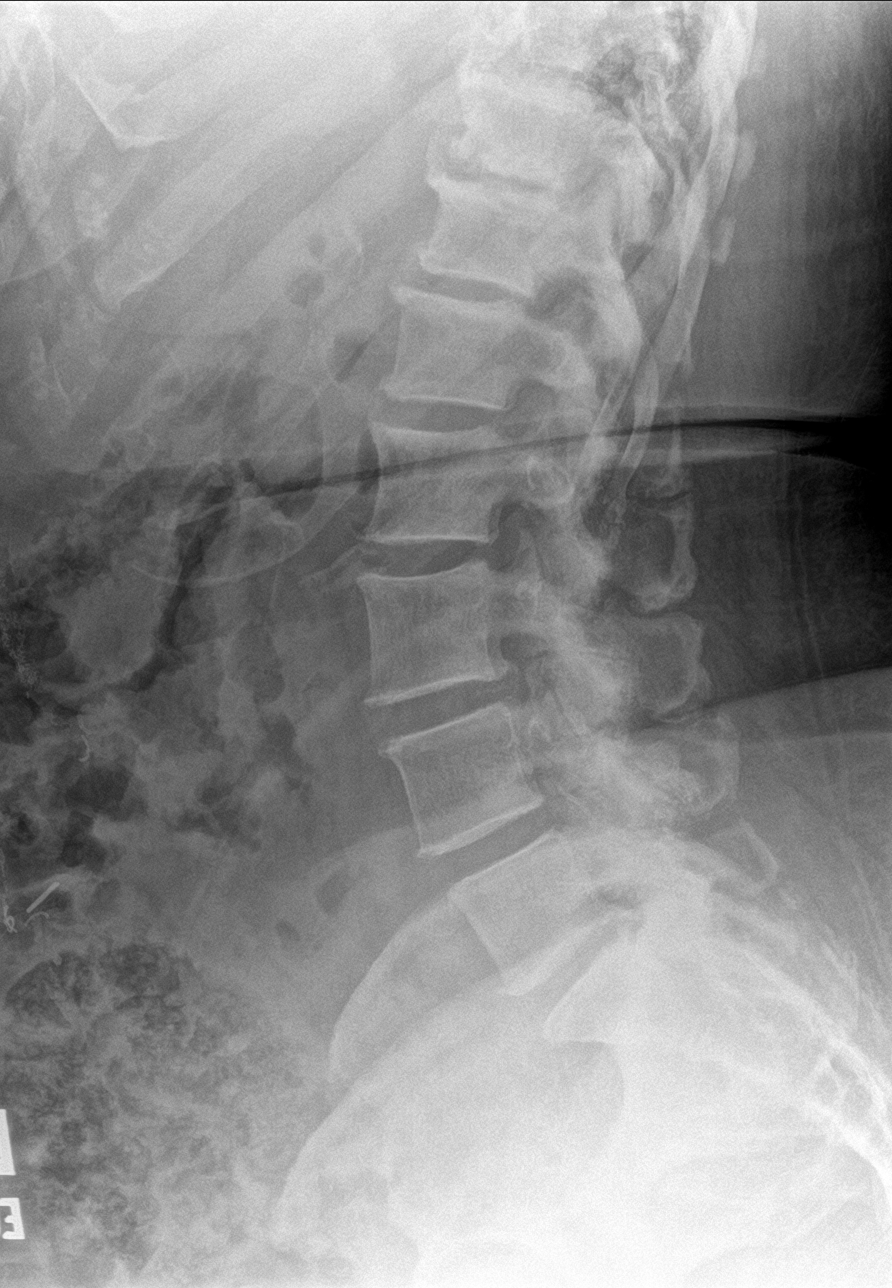
[im 5/5]
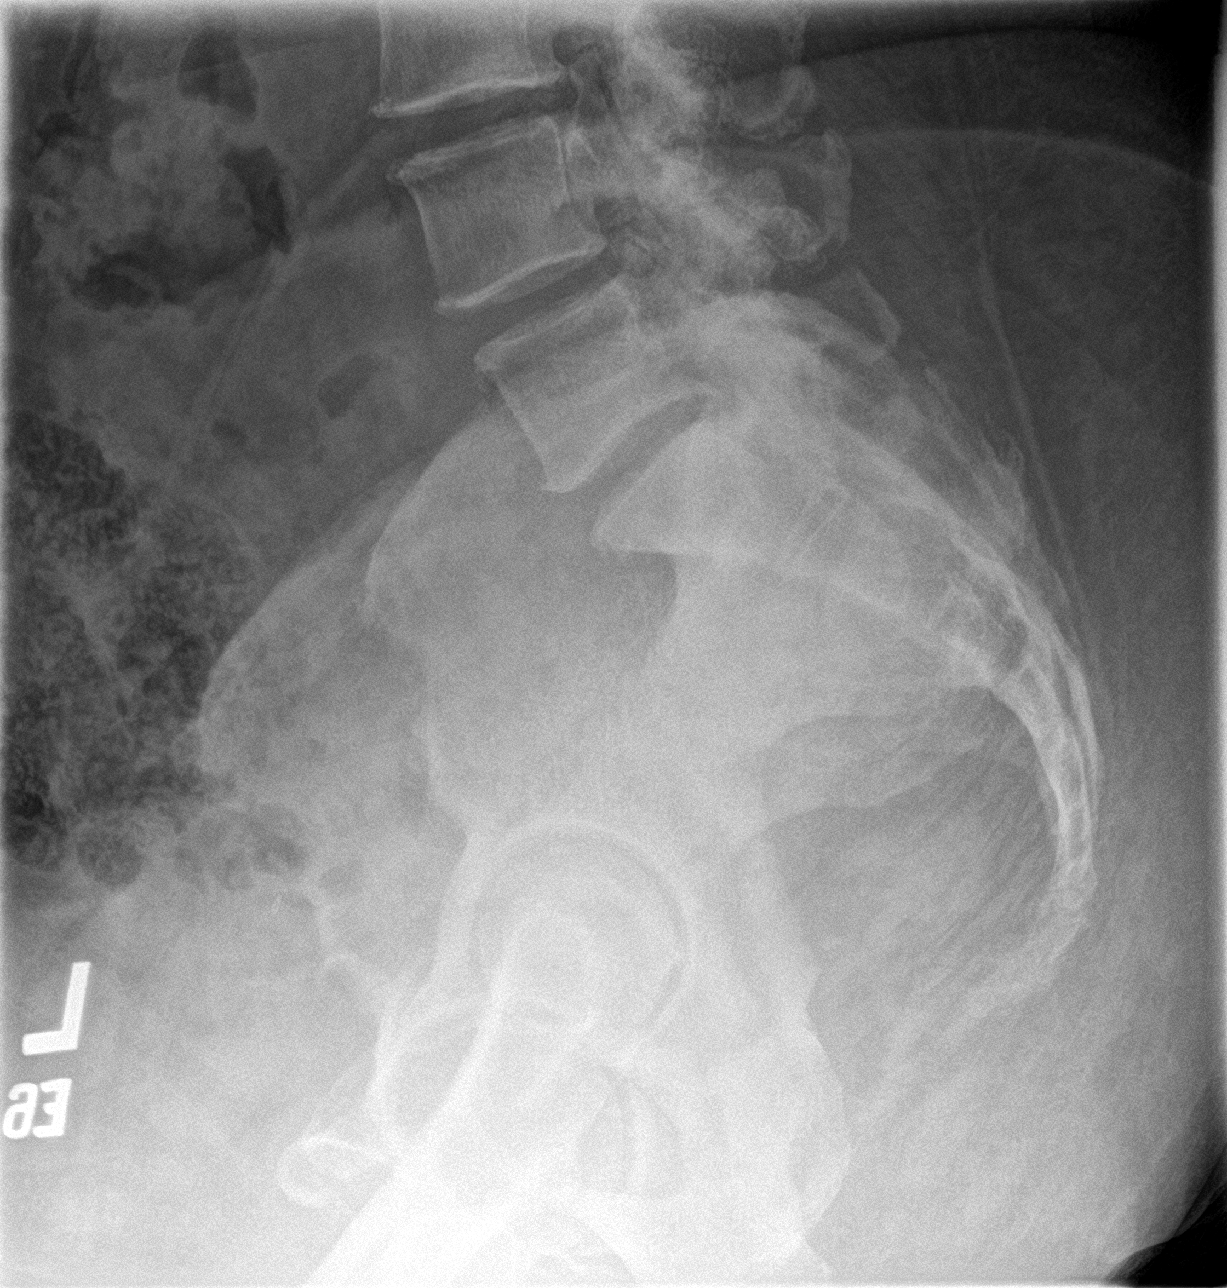

[5 of 5 positions shown; findings below may reference images not displayed]

FINDINGS: Five lumbar type vertebral bodies. Surgical clips in the right side
of the abdomen. Degenerative sclerosis of both sacroiliac joints.
Lower thoracic spondylosis is suboptimally evaluated. Maintenance of
vertebral body height and alignment. Mild loss of intervertebral
disc height at the lumbosacral junction is unchanged. Facets are
well-aligned.
IMPRESSION: Spondylosis, without acute osseous abnormality.

## 2023-10-23 ENCOUNTER — Other Ambulatory Visit: Payer: Self-pay | Admitting: Family Medicine

## 2023-10-23 DIAGNOSIS — Z1231 Encounter for screening mammogram for malignant neoplasm of breast: Secondary | ICD-10-CM

## 2023-11-30 ENCOUNTER — Ambulatory Visit
Admission: RE | Admit: 2023-11-30 | Discharge: 2023-11-30 | Disposition: A | Discharge status: Home / Self Care | Source: Ambulatory Visit | Attending: Family Medicine | Admitting: Family Medicine

## 2023-11-30 DIAGNOSIS — Z1231 Encounter for screening mammogram for malignant neoplasm of breast: Secondary | ICD-10-CM | POA: Insufficient documentation

## 2024-04-01 ENCOUNTER — Other Ambulatory Visit: Payer: Self-pay | Admitting: Nephrology

## 2024-04-01 DIAGNOSIS — E1122 Type 2 diabetes mellitus with diabetic chronic kidney disease: Secondary | ICD-10-CM

## 2024-04-01 DIAGNOSIS — N1831 Chronic kidney disease, stage 3a: Secondary | ICD-10-CM

## 2024-07-17 ENCOUNTER — Inpatient Hospital Stay: Admitting: Oncology

## 2024-07-17 ENCOUNTER — Inpatient Hospital Stay
